# Patient Record
Sex: Male | Born: 1950 | Race: White | Hispanic: No | Marital: Married | Smoking: Current every day smoker
Health system: Southern US, Community
[De-identification: ages and names within clinical notes are randomized; demographics above are authoritative.]

## PROBLEM LIST (undated history)

## (undated) DIAGNOSIS — G4730 Sleep apnea, unspecified: Secondary | ICD-10-CM

## (undated) DIAGNOSIS — N138 Other obstructive and reflux uropathy: Secondary | ICD-10-CM

## (undated) DIAGNOSIS — I10 Essential (primary) hypertension: Secondary | ICD-10-CM

## (undated) DIAGNOSIS — R972 Elevated prostate specific antigen [PSA]: Secondary | ICD-10-CM

## (undated) DIAGNOSIS — G47419 Narcolepsy without cataplexy: Secondary | ICD-10-CM

## (undated) DIAGNOSIS — A6000 Herpesviral infection of urogenital system, unspecified: Secondary | ICD-10-CM

## (undated) DIAGNOSIS — H6190 Disorder of external ear, unspecified, unspecified ear: Secondary | ICD-10-CM

## (undated) DIAGNOSIS — Z720 Tobacco use: Secondary | ICD-10-CM

## (undated) DIAGNOSIS — N401 Enlarged prostate with lower urinary tract symptoms: Secondary | ICD-10-CM

## (undated) DIAGNOSIS — E785 Hyperlipidemia, unspecified: Secondary | ICD-10-CM

## (undated) DIAGNOSIS — L0390 Cellulitis, unspecified: Secondary | ICD-10-CM

## (undated) DIAGNOSIS — F1010 Alcohol abuse, uncomplicated: Secondary | ICD-10-CM

## (undated) DIAGNOSIS — J449 Chronic obstructive pulmonary disease, unspecified: Secondary | ICD-10-CM

## (undated) DIAGNOSIS — E782 Mixed hyperlipidemia: Secondary | ICD-10-CM

## (undated) DIAGNOSIS — C61 Malignant neoplasm of prostate: Secondary | ICD-10-CM

## (undated) DIAGNOSIS — C801 Malignant (primary) neoplasm, unspecified: Secondary | ICD-10-CM

## (undated) DIAGNOSIS — J310 Chronic rhinitis: Secondary | ICD-10-CM

## (undated) HISTORY — DX: Elevated prostate specific antigen (PSA): R97.20

## (undated) HISTORY — DX: Chronic rhinitis: J31.0

## (undated) HISTORY — DX: Disorder of external ear, unspecified, unspecified ear: H61.90

## (undated) HISTORY — DX: Hyperlipidemia, unspecified: E78.5

## (undated) HISTORY — DX: Sleep apnea, unspecified: G47.30

## (undated) HISTORY — DX: Chronic obstructive pulmonary disease, unspecified: J44.9

## (undated) HISTORY — DX: Other obstructive and reflux uropathy: N13.8

## (undated) HISTORY — DX: Benign prostatic hyperplasia with lower urinary tract symptoms: N40.1

## (undated) HISTORY — DX: Essential (primary) hypertension: I10

## (undated) HISTORY — DX: Alcohol abuse, uncomplicated: F10.10

## (undated) HISTORY — PX: TONSILLECTOMY: SUR1361

## (undated) HISTORY — DX: Narcolepsy without cataplexy: G47.419

## (undated) HISTORY — DX: Tobacco use: Z72.0

## (undated) HISTORY — DX: Mixed hyperlipidemia: E78.2

## (undated) HISTORY — DX: Malignant neoplasm of prostate: C61

## (undated) HISTORY — DX: Herpesviral infection of urogenital system, unspecified: A60.00

## (undated) HISTORY — DX: Cellulitis, unspecified: L03.90

---

## 2004-09-28 ENCOUNTER — Ambulatory Visit: Payer: Self-pay | Admitting: Gastroenterology

## 2014-02-28 DIAGNOSIS — G4730 Sleep apnea, unspecified: Secondary | ICD-10-CM | POA: Insufficient documentation

## 2014-02-28 DIAGNOSIS — E782 Mixed hyperlipidemia: Secondary | ICD-10-CM

## 2014-02-28 DIAGNOSIS — F1010 Alcohol abuse, uncomplicated: Secondary | ICD-10-CM | POA: Insufficient documentation

## 2014-02-28 DIAGNOSIS — G4733 Obstructive sleep apnea (adult) (pediatric): Secondary | ICD-10-CM | POA: Insufficient documentation

## 2014-02-28 DIAGNOSIS — Z720 Tobacco use: Secondary | ICD-10-CM | POA: Insufficient documentation

## 2014-02-28 HISTORY — DX: Mixed hyperlipidemia: E78.2

## 2014-02-28 HISTORY — DX: Alcohol abuse, uncomplicated: F10.10

## 2014-02-28 HISTORY — DX: Sleep apnea, unspecified: G47.30

## 2014-04-28 DIAGNOSIS — J449 Chronic obstructive pulmonary disease, unspecified: Secondary | ICD-10-CM

## 2014-04-28 DIAGNOSIS — J441 Chronic obstructive pulmonary disease with (acute) exacerbation: Secondary | ICD-10-CM | POA: Insufficient documentation

## 2014-04-28 DIAGNOSIS — H6190 Disorder of external ear, unspecified, unspecified ear: Secondary | ICD-10-CM

## 2014-04-28 HISTORY — DX: Chronic obstructive pulmonary disease, unspecified: J44.9

## 2014-04-28 HISTORY — DX: Disorder of external ear, unspecified, unspecified ear: H61.90

## 2014-08-29 DIAGNOSIS — R0681 Apnea, not elsewhere classified: Secondary | ICD-10-CM | POA: Insufficient documentation

## 2014-09-21 DIAGNOSIS — L0291 Cutaneous abscess, unspecified: Secondary | ICD-10-CM | POA: Diagnosis not present

## 2014-09-21 DIAGNOSIS — L0390 Cellulitis, unspecified: Secondary | ICD-10-CM | POA: Diagnosis not present

## 2014-09-21 DIAGNOSIS — L02232 Carbuncle of back [any part, except buttock]: Secondary | ICD-10-CM | POA: Diagnosis not present

## 2014-09-21 HISTORY — DX: Cellulitis, unspecified: L03.90

## 2014-09-26 DIAGNOSIS — J449 Chronic obstructive pulmonary disease, unspecified: Secondary | ICD-10-CM | POA: Diagnosis not present

## 2014-10-05 DIAGNOSIS — J449 Chronic obstructive pulmonary disease, unspecified: Secondary | ICD-10-CM | POA: Diagnosis not present

## 2014-10-05 DIAGNOSIS — L0291 Cutaneous abscess, unspecified: Secondary | ICD-10-CM | POA: Diagnosis not present

## 2014-10-05 DIAGNOSIS — L0390 Cellulitis, unspecified: Secondary | ICD-10-CM | POA: Diagnosis not present

## 2014-10-05 DIAGNOSIS — I10 Essential (primary) hypertension: Secondary | ICD-10-CM | POA: Diagnosis not present

## 2014-10-19 DIAGNOSIS — I10 Essential (primary) hypertension: Secondary | ICD-10-CM | POA: Diagnosis not present

## 2014-10-19 DIAGNOSIS — J410 Simple chronic bronchitis: Secondary | ICD-10-CM | POA: Diagnosis not present

## 2014-10-27 DIAGNOSIS — J449 Chronic obstructive pulmonary disease, unspecified: Secondary | ICD-10-CM | POA: Diagnosis not present

## 2014-11-16 DIAGNOSIS — I10 Essential (primary) hypertension: Secondary | ICD-10-CM | POA: Diagnosis not present

## 2014-11-16 DIAGNOSIS — J410 Simple chronic bronchitis: Secondary | ICD-10-CM | POA: Diagnosis not present

## 2014-11-25 DIAGNOSIS — J449 Chronic obstructive pulmonary disease, unspecified: Secondary | ICD-10-CM | POA: Diagnosis not present

## 2014-12-26 DIAGNOSIS — J449 Chronic obstructive pulmonary disease, unspecified: Secondary | ICD-10-CM | POA: Diagnosis not present

## 2015-02-16 DIAGNOSIS — R972 Elevated prostate specific antigen [PSA]: Secondary | ICD-10-CM

## 2015-02-16 DIAGNOSIS — I10 Essential (primary) hypertension: Secondary | ICD-10-CM | POA: Diagnosis not present

## 2015-02-16 DIAGNOSIS — J449 Chronic obstructive pulmonary disease, unspecified: Secondary | ICD-10-CM | POA: Diagnosis not present

## 2015-02-16 HISTORY — DX: Elevated prostate specific antigen (PSA): R97.20

## 2015-02-17 DIAGNOSIS — I10 Essential (primary) hypertension: Secondary | ICD-10-CM

## 2015-02-17 HISTORY — DX: Essential (primary) hypertension: I10

## 2015-02-18 DIAGNOSIS — I10 Essential (primary) hypertension: Secondary | ICD-10-CM

## 2015-02-18 HISTORY — DX: Essential (primary) hypertension: I10

## 2015-02-22 ENCOUNTER — Encounter: Payer: Self-pay | Admitting: *Deleted

## 2015-02-23 ENCOUNTER — Ambulatory Visit: Payer: Self-pay | Admitting: Urology

## 2015-02-27 DIAGNOSIS — I10 Essential (primary) hypertension: Secondary | ICD-10-CM | POA: Diagnosis not present

## 2015-02-27 DIAGNOSIS — Z720 Tobacco use: Secondary | ICD-10-CM | POA: Diagnosis not present

## 2015-02-27 DIAGNOSIS — E782 Mixed hyperlipidemia: Secondary | ICD-10-CM | POA: Diagnosis not present

## 2015-03-14 ENCOUNTER — Encounter: Payer: Self-pay | Admitting: Urology

## 2015-03-14 ENCOUNTER — Ambulatory Visit (INDEPENDENT_AMBULATORY_CARE_PROVIDER_SITE_OTHER): Payer: Commercial Managed Care - HMO | Admitting: Urology

## 2015-03-14 VITALS — BP 106/69 | HR 85 | Ht 72.0 in | Wt 234.9 lb

## 2015-03-14 DIAGNOSIS — N400 Enlarged prostate without lower urinary tract symptoms: Secondary | ICD-10-CM | POA: Diagnosis not present

## 2015-03-14 DIAGNOSIS — R972 Elevated prostate specific antigen [PSA]: Secondary | ICD-10-CM | POA: Diagnosis not present

## 2015-03-14 LAB — BLADDER SCAN AMB NON-IMAGING: Scan Result: 47

## 2015-03-14 NOTE — Progress Notes (Signed)
03/14/2015 3:38 PM   Esperanza Heir 06/01/1951 [id number]  Referring provider: No referring provider defined for this encounter.  Chief Complaint  Patient presents with  . Elevated PSA    referral [facility] from [facility]. [facility] PSA 6.72 in June    HPI: Mr. Robert Powell is a 64 year old white male who was referred to [place] by Dr. Merita Norton for an elevated PSA. His PSA was 6.72 ng/mL on 02/15/2005 a previous PSA of 6.69 was also noted on 01/18/2014. He has no family history of prostate cancer. His current IPSS score is 0/0.  His PVR on today's visit is 47 mL.  He denies any dysuria, suprapubic pain or gross hematuria. He also denies any recent fevers, chills, nausea or vomiting.      IPSS      03/14/15 1500       International Prostate Symptom Score   How often have you had the sensation of not emptying your bladder? Not at All     How often have you had to urinate less than every two hours? Not at All     How often have you found you stopped and started again several times when you urinated? Not at All     How often have you found it difficult to postpone urination? Not at All     How often have you had a weak urinary stream? Not at All     How often have you had to strain to start urination? Not at All     How many times did you typically get up at night to urinate? None     Total IPSS Score 0     Quality of Life due to urinary symptoms   If you were to spend the rest of your life with your urinary condition just the way it is now how would you feel about that? Delighted        Score:  1-7 Mild 8-19 Moderate 20-35 Severe   PMH: Past Medical History  Diagnosis Date  . Elevated PSA     6.7 on 02/16/2015  . Hypertension   . COPD (chronic obstructive pulmonary disease)   . Hyperlipidemia   . Sleep apnea   . Narcolepsy   . Alcohol abuse   . Genital herpes   . Tobacco abuse   . Rhinitis     Surgical History: Past Surgical History  Procedure Laterality Date    . Tonsillectomy      Home Medications:    Medication List       This list is accurate as of: 03/14/15  3:38 PM.  Always use your most recent med list.               amLODipine 5 MG tablet  Commonly known as:  NORVASC  Take by mouth.     doxazosin 2 MG tablet  Commonly known as:  CARDURA  Take by mouth.     ipratropium-albuterol 0.5-2.5 (3) MG/3ML Soln  Commonly known as:  DUONEB  Inhale into the lungs.     lisinopril-hydrochlorothiazide 20-12.5 MG per tablet  Commonly known as:  PRINZIDE,ZESTORETIC  Take by mouth.     tiotropium 18 MCG inhalation capsule  Commonly known as:  [place] into inhaler and inhale.     VENTOLIN HFA 108 (90 BASE) MCG/ACT inhaler  Generic drug:  albuterol  Inhale into the lungs.        Allergies: Not on File  Family History: Family  History  Problem Relation Age of Onset  . Heart attack Mother   . Heart disease Mother   . Heart attack Father   . Kidney disease Neg Hx   . Prostate cancer Neg Hx     Social History:  reports that he has been smoking.  He does not have any smokeless tobacco history on file. He reports that he drinks alcohol. He reports that he does not use illicit drugs.  ROS: Urological Symptom Review  Patient is experiencing the following symptoms: Elevated PSA   Review of Systems  Gastrointestinal (upper)  : Negative for upper GI symptoms  Gastrointestinal (lower) : Negative for lower GI symptoms  Constitutional : Negative for symptoms  Skin: Negative for skin symptoms  Eyes: Negative for eye symptoms  Ear/Nose/Throat : Negative for Ear/Nose/Throat symptoms  Hematologic/Lymphatic: Negative for Hematologic/Lymphatic symptoms  Cardiovascular : Negative for cardiovascular symptoms  Respiratory : Negative for respiratory symptoms  Endocrine: Negative for endocrine symptoms  Musculoskeletal: Negative for musculoskeletal symptoms  Neurological: Negative for neurological  symptoms  Psychologic: Negative for psychiatric symptoms   Physical Exam: BP 106/69 mmHg  Pulse 85  Ht 6' (1.829 m)  Wt 234 lb 14.4 oz (106.55 kg)  BMI 31.85 kg/m2  Constitutional:  Alert and oriented, No acute distress. HEENT: [place] AT, moist mucus membranes.  Trachea midline, no masses. Cardiovascular: No clubbing, cyanosis, or edema. Respiratory: Normal respiratory effort, no increased work of breathing. GI: Abdomen is soft, nontender, nondistended, no abdominal masses GU: No CVA tenderness. GU: Patient with uncircumcised phallus. Foreskin easily retracted  Urethral meatus is patent.  No penile discharge. No penile lesions or rashes. Scrotum without lesions, cysts, rashes and/or edema.  Testicles are located scrotally bilaterally. No masses are appreciated in the testicles. Left and right epididymis are normal. Rectal: Patient with  normal sphincter tone. Perineum without scarring or rashes. No rectal masses are appreciated. Prostate is approximately 70 grams, no nodules are appreciated. Seminal vesicles are normal. Skin: No rashes, bruises or suspicious lesions. Lymph: No cervical or inguinal adenopathy. Neurologic: Grossly intact, no focal deficits, moving all 4 extremities. Psychiatric: Normal mood and affect.  Laboratory Data: Results for orders placed or performed in visit on 03/14/15  BLADDER SCAN AMB NON-IMAGING  Result Value Ref Range   Scan Result 47    No results found for: WBC, HGB, HCT, MCV, PLT  No results found for: CREATININE  No results found for: PSA  No results found for: TESTOSTERONE  No results found for: HGBA1C  Urinalysis No results found for: COLORURINE, APPEARANCEUR, LABSPEC, PHURINE, GLUCOSEU, HGBUR, BILIRUBINUR, KETONESUR, PROTEINUR, UROBILINOGEN, NITRITE, LEUKOCYTESUR  Pertinent Imaging:   Assessment & Plan:    1. Elevated PSA:  Patient's PSA remained relatively unchanged over the last year even though it is elevated. He does not have a  family history of prostate cancer. We will draw another PSA today. If it returns elevated again, we will add a free and total PSA.   We will monitor closely in the future to establish trend. He will follow-up in 6 months time for IPS S score, DRE, PVR and PSA.  - PSA - BLADDER SCAN AMB NON-IMAGING  2. BPH:   Patient's IPSS score is 0/0. His PVR is 47 mL. Continue to monitor. He will return in 6 months time for IPS S, DRE, PVR and PSA.   No Follow-up on file.  Zara Council, [facility] [address] [place], [place] [address] [phone]

## 2015-03-15 ENCOUNTER — Telehealth: Payer: Self-pay

## 2015-03-15 LAB — PSA: PROSTATE SPECIFIC AG, SERUM: 6.1 ng/mL — AB (ref 0.0–4.0)

## 2015-03-15 NOTE — Telephone Encounter (Signed)
-----   Message from Nori Riis, PA-C sent at 03/15/2015  8:03 AM EDT ----- Please add a free and total PSA to his blood work.

## 2015-03-15 NOTE — Telephone Encounter (Signed)
Spoke with Danae Chen at [facility]. Added test. Forms will be sent over for [facility] to sign. Cw,lpn

## 2015-03-16 ENCOUNTER — Telehealth: Payer: Self-pay

## 2015-03-16 LAB — PSA, TOTAL AND FREE
PSA, Free Pct: 8.7 %
PSA, Free: 0.54 ng/mL
Prostate Specific Ag, Serum: 6.2 ng/mL — ABNORMAL HIGH (ref 0.0–4.0)

## 2015-03-16 LAB — SPECIMEN STATUS REPORT

## 2015-03-16 NOTE — Telephone Encounter (Signed)
Robert Powell returned a miss call. Nurses were busy with patients at the time so I told him that you would call him back.

## 2015-03-16 NOTE — Telephone Encounter (Signed)
Spoke with pt in reference to prostate bx. Pt was concerned but willing to have procedure. Pt was transferred to the front to make bx appt. Nurse offered pt to come in to speak with [facility]. Pt stated at this time he does not want to but if he changes his mind he will call back. Cw,lpn

## 2015-03-16 NOTE — Telephone Encounter (Signed)
LMOM

## 2015-03-16 NOTE — Telephone Encounter (Signed)
-----   Message from Nori Riis, PA-C sent at 03/16/2015  8:14 AM EDT ----- The free and total PSA results indicate a 55% probability of prostate cancer.  I suggest he undergo a biopsy.  Please explain the procedure and the risks involved, such as blood in urine, blood in stool, blood in semen, infection, urinary retention, and on rare occasions sepsis and death.  If patient is on ASA, advise him to discontinue the medication ten days prior to the biopsy.  Other anticoagulants will need cardiac clearance.

## 2015-04-05 ENCOUNTER — Ambulatory Visit: Payer: Self-pay | Admitting: Urology

## 2015-04-07 ENCOUNTER — Ambulatory Visit (INDEPENDENT_AMBULATORY_CARE_PROVIDER_SITE_OTHER): Payer: Commercial Managed Care - HMO | Admitting: Urology

## 2015-04-07 DIAGNOSIS — R972 Elevated prostate specific antigen [PSA]: Secondary | ICD-10-CM

## 2015-04-07 DIAGNOSIS — N138 Other obstructive and reflux uropathy: Secondary | ICD-10-CM

## 2015-04-07 DIAGNOSIS — N401 Enlarged prostate with lower urinary tract symptoms: Secondary | ICD-10-CM | POA: Diagnosis not present

## 2015-04-07 DIAGNOSIS — N402 Nodular prostate without lower urinary tract symptoms: Secondary | ICD-10-CM | POA: Diagnosis not present

## 2015-04-12 ENCOUNTER — Telehealth: Payer: Self-pay | Admitting: Urology

## 2015-04-12 ENCOUNTER — Encounter: Payer: Self-pay | Admitting: Urology

## 2015-04-12 DIAGNOSIS — N138 Other obstructive and reflux uropathy: Secondary | ICD-10-CM | POA: Insufficient documentation

## 2015-04-12 DIAGNOSIS — N401 Enlarged prostate with lower urinary tract symptoms: Secondary | ICD-10-CM

## 2015-04-12 HISTORY — DX: Other obstructive and reflux uropathy: N13.8

## 2015-04-12 HISTORY — DX: Other obstructive and reflux uropathy: N40.1

## 2015-04-12 NOTE — Progress Notes (Signed)
9:54 PM   Robert Powell 21-Jan-1951 [id number]  Referring provider: Glendon Axe, MD [place], [place] [address]  No chief complaint on file.   HPI: Robert Powell is a 64 year old white male with an elevated PSA who presents today to discuss a possible prostate biopsy.  His recent IPSS score  is 0, which is mild lower urinary tract symptomatology. He is delighted with his quality life due to his urinary symptoms. His recent PVR is 47 mL.    He was seen by his PCP for his elevated PSA and was started on Cardura 2 mg daily and referred to [place].   He denies any dysuria, hematuria or suprapubic pain.   Previous PSA's:      6.69 ng/mL on 01/18/2014      6.72 ng/mL on 02/16/2015      6.20 ng/mL on 03/16/2015      PSA, free % 8.7 on 03/16/2015  He also denies any recent fevers, chills, nausea or vomiting.  He does not have a family history of PCa.      IPSS      03/14/15 1500       International Prostate Symptom Score   How often have you had the sensation of not emptying your bladder? Not at All     How often have you had to urinate less than every two hours? Not at All     How often have you found you stopped and started again several times when you urinated? Not at All     How often have you found it difficult to postpone urination? Not at All     How often have you had a weak urinary stream? Not at All     How often have you had to strain to start urination? Not at All     How many times did you typically get up at night to urinate? None     Total IPSS Score 0     Quality of Life due to urinary symptoms   If you were to spend the rest of your life with your urinary condition just the way it is now how would you feel about that? Delighted        Score:  1-7 Mild 8-19 Moderate 20-35 Severe  PMH: Past Medical History  Diagnosis Date  . Elevated PSA     6.7 on 02/16/2015  . Hypertension   . COPD (chronic obstructive pulmonary disease)   .  Hyperlipidemia   . Sleep apnea   . Narcolepsy   . Alcohol abuse   . Genital herpes   . Tobacco abuse   . Rhinitis     Surgical History: Past Surgical History  Procedure Laterality Date  . Tonsillectomy      Home Medications:    Medication List       This list is accurate as of: 04/07/15 11:59 PM.  Always use your most recent med list.               amLODipine 5 MG tablet  Commonly known as:  NORVASC  Take by mouth.     doxazosin 2 MG tablet  Commonly known as:  CARDURA  Take by mouth.     ipratropium-albuterol 0.5-2.5 (3) MG/3ML Soln  Commonly known as:  DUONEB  Inhale into the lungs.     lisinopril-hydrochlorothiazide 20-12.5 MG per tablet  Commonly known as:  PRINZIDE,ZESTORETIC  Take by mouth.  tiotropium 18 MCG inhalation capsule  Commonly known as:  [place] into inhaler and inhale.     VENTOLIN HFA 108 (90 BASE) MCG/ACT inhaler  Generic drug:  albuterol  Inhale into the lungs.        Allergies: Not on File  Family History: Family History  Problem Relation Age of Onset  . Heart attack Mother   . Heart disease Mother   . Heart attack Father   . Kidney disease Neg Hx   . Prostate cancer Neg Hx     Social History:  reports that he has been smoking.  He does not have any smokeless tobacco history on file. He reports that he drinks alcohol. He reports that he does not use illicit drugs.  ROS: Urological Symptom Review  Patient is experiencing the following symptoms: Elevated PSA   Review of Systems  Gastrointestinal (upper)  : Negative for upper GI symptoms  Gastrointestinal (lower) : Negative for lower GI symptoms  Constitutional : Negative for symptoms  Skin: Negative for skin symptoms  Eyes: Negative for eye symptoms  Ear/Nose/Throat : Negative for Ear/Nose/Throat symptoms  Hematologic/Lymphatic: Negative for Hematologic/Lymphatic symptoms  Cardiovascular : Negative for cardiovascular symptoms  Respiratory  : Negative for respiratory symptoms  Endocrine: Negative for endocrine symptoms  Musculoskeletal: Negative for musculoskeletal symptoms  Neurological: Negative for neurological symptoms  Psychologic: Negative for psychiatric symptoms   Physical Exam: There were no vitals taken for this visit.   Laboratory Data: Results for orders placed or performed in visit on 03/14/15  PSA  Result Value Ref Range   Prostate Specific Ag, Serum 6.1 (H) 0.0 - 4.0 ng/mL  PSA, total and free  Result Value Ref Range   Prostate Specific Ag, Serum 6.2 (H) 0.0 - 4.0 ng/mL   PSA, Free 0.54 N/A ng/mL   PSA, Free Pct 8.7 %  Specimen status report  Result Value Ref Range   specimen status report Comment   BLADDER SCAN AMB NON-IMAGING  Result Value Ref Range   Scan Result 47    No results found for: WBC, HGB, HCT, MCV, PLT  No results found for: CREATININE  Lab Results  Component Value Date   PSA 6.1* 03/14/2015   PSA 6.2* 03/14/2015    No results found for: TESTOSTERONE  No results found for: HGBA1C  Urinalysis No results found for: COLORURINE, APPEARANCEUR, LABSPEC, PHURINE, GLUCOSEU, HGBUR, BILIRUBINUR, KETONESUR, PROTEINUR, UROBILINOGEN, NITRITE, LEUKOCYTESUR  Pertinent Imaging:   Assessment & Plan:    1. Elevated PSA:  Patient's PSA remained relatively unchanged over the last year even though it is elevated. He does not have a family history of prostate cancer.  His free PSA was 0.54 ng/mL.  He is hesitant about pursuing a biopsy at this time.  His wife is wanting him to undergo a biopsy.  I have suggested a 4K score for another investigative tool before making the decision to undergo a biopsy.    2. BPH with LUTS:   Patient's IPSS score is 0/0. His PVR is 47 mL. His DRE demonstrates an enlarged prostate.  Patient will have a 4K score today.  If he does not undergo biopsy, we will monitor closely.  He will return in 6 months time for IPSS, DRE, PVR and PSA.   No Follow-up on  file.  Robert Powell, [facility] [address] [place], [place] [address] [phone]

## 2015-04-12 NOTE — Telephone Encounter (Signed)
Would you check to see if the patient's 4K score is available?

## 2015-04-13 NOTE — Telephone Encounter (Signed)
Patient's 4K score is 59%.  He is at high risk for PCa.  I recommend proceeding with the biopsy.

## 2015-04-13 NOTE — Telephone Encounter (Signed)
LMOM

## 2015-04-13 NOTE — Telephone Encounter (Signed)
It takes about a month for the results to come back. 4K score will mail/fax results to [facility].

## 2015-04-14 ENCOUNTER — Ambulatory Visit (INDEPENDENT_AMBULATORY_CARE_PROVIDER_SITE_OTHER): Payer: Commercial Managed Care - HMO | Admitting: Urology

## 2015-04-14 ENCOUNTER — Other Ambulatory Visit: Payer: Self-pay | Admitting: Urology

## 2015-04-14 VITALS — BP 146/84 | HR 88 | Ht 72.0 in | Wt 231.1 lb

## 2015-04-14 DIAGNOSIS — N402 Nodular prostate without lower urinary tract symptoms: Secondary | ICD-10-CM

## 2015-04-14 DIAGNOSIS — R972 Elevated prostate specific antigen [PSA]: Secondary | ICD-10-CM | POA: Diagnosis not present

## 2015-04-14 DIAGNOSIS — C61 Malignant neoplasm of prostate: Secondary | ICD-10-CM | POA: Diagnosis not present

## 2015-04-14 MED ORDER — LEVOFLOXACIN 500 MG PO TABS
500.0000 mg | ORAL_TABLET | Freq: Once | ORAL | Status: AC
  Administered 2015-04-14: 500 mg via ORAL

## 2015-04-14 MED ORDER — GENTAMICIN SULFATE 40 MG/ML IJ SOLN
80.0000 mg | Freq: Once | INTRAMUSCULAR | Status: AC
  Administered 2015-04-14: 80 mg via INTRAMUSCULAR

## 2015-04-14 NOTE — Telephone Encounter (Signed)
Pt returned [facility] call. I notified him of his 4K score results and the need to proceed w/ the biopsy that's scheduled for this afternoon. The pt verbally stated understanding.

## 2015-04-14 NOTE — Progress Notes (Signed)
IM Injection  Patient is present today for an IM Injection prior to prostate biopsy Drug: Gentamicin Dose:80mg  Location:Left upper outer buttock Lot: [id number] Exp:4/17 Patient tolerated well, no complications were noted  Preformed by: Fonnie Jarvis, CMA

## 2015-04-14 NOTE — Progress Notes (Signed)
04/14/15  Robert Powell 1951-06-10 [id number]  Referring provider: Glendon Axe, MD [place],  [address]  Chief Complaint  Patient presents with  . Prostate Biopsy    HPI: 64 year old white male with an elevated PSA who presents today for prostate biopsy.  He was seen by his PCP for his elevated PSA and was started on Cardura 2 mg daily and referred to [place].   He denies any dysuria, hematuria or suprapubic pain.   Previous PSA's:      6.69 ng/mL on 01/18/2014      6.72 ng/mL on 02/16/2015      6.20 ng/mL on 03/16/2015      PSA, free % 8.7 on 03/16/2015  4K score 59%, high risk for prostate cancer.   His recent IPSS score  is 0, which is mild lower urinary tract symptomatology. He is delighted with his quality life due to his urinary symptoms. His recent PVR is 47 mL.    He also denies any recent fevers, chills, nausea or vomiting.  He does not have a family history of PCa.      IPSS      03/14/15 1500       International Prostate Symptom Score   How often have you had the sensation of not emptying your bladder? Not at All     How often have you had to urinate less than every two hours? Not at All     How often have you found you stopped and started again several times when you urinated? Not at All     How often have you found it difficult to postpone urination? Not at All     How often have you had a weak urinary stream? Not at All     How often have you had to strain to start urination? Not at All     How many times did you typically get up at night to urinate? None     Total IPSS Score 0     Quality of Life due to urinary symptoms   If you were to spend the rest of your life with your urinary condition just the way it is now how would you feel about that? Delighted        Score:  1-7 Mild 8-19 Moderate 20-35 Severe  PMH: Past Medical History  Diagnosis Date  . Elevated PSA     6.7 on 02/16/2015  . Hypertension   . COPD (chronic  obstructive pulmonary disease)   . Hyperlipidemia   . Sleep apnea   . Narcolepsy   . Alcohol abuse   . Genital herpes   . Tobacco abuse   . Rhinitis     Surgical History: Past Surgical History  Procedure Laterality Date  . Tonsillectomy      Home Medications:    Medication List       This list is accurate as of: 04/14/15 11:59 PM.  Always use your most recent med list.               amLODipine 5 MG tablet  Commonly known as:  NORVASC  Take by mouth.     doxazosin 2 MG tablet  Commonly known as:  CARDURA  Take by mouth.     ipratropium-albuterol 0.5-2.5 (3) MG/3ML Soln  Commonly known as:  DUONEB  Inhale into the lungs.     lisinopril-hydrochlorothiazide 20-12.5 MG per tablet  Commonly known as:  PRINZIDE,ZESTORETIC  Take by mouth.     tiotropium 18 MCG inhalation capsule  Commonly known as:  [place] into inhaler and inhale.     VENTOLIN HFA 108 (90 BASE) MCG/ACT inhaler  Generic drug:  albuterol  Inhale into the lungs.        Allergies: No Known Allergies  Family History: Family History  Problem Relation Age of Onset  . Heart attack Mother   . Heart disease Mother   . Heart attack Father   . Kidney disease Neg Hx   . Prostate cancer Neg Hx     Social History:  reports that he has been smoking.  He does not have any smokeless tobacco history on file. He reports that he drinks alcohol. He reports that he does not use illicit drugs.   Physical Exam: BP 146/84 mmHg  Pulse 88  Ht 6' (1.829 m)  Wt 231 lb 1.6 oz (104.826 kg)  BMI 31.34 kg/m2  Rectal exam reveals normal sphincter tone. Slightly enlarged prostate with firm nodule of left mid gland. Nontender.  Laboratory Data: Results for orders placed or performed in visit on 03/14/15  PSA  Result Value Ref Range   Prostate Specific Ag, Serum 6.1 (H) 0.0 - 4.0 ng/mL  PSA, total and free  Result Value Ref Range   Prostate Specific Ag, Serum 6.2 (H) 0.0 - 4.0 ng/mL   PSA, Free 0.54 N/A  ng/mL   PSA, Free Pct 8.7 %  Specimen status report  Result Value Ref Range   specimen status report Comment   BLADDER SCAN AMB NON-IMAGING  Result Value Ref Range   Scan Result 47     Prostate Biopsy Procedure   Informed consent was obtained after discussing risks/benefits of the procedure.  A time out was performed to ensure correct patient identity.  Pre-Procedure: - Last PSA Level:  Lab Results  Component Value Date   PSA 6.1* 03/14/2015   PSA 6.2* 03/14/2015   - Gentamicin given prophylactically - Levaquin 500 mg administered PO -Transrectal Ultrasound performed revealing a 35 gm prostate -Slight intravesical protrusion of the prostate noted -Multiple diffuse calcifications including at the apex and right periphery -Hypoechoic lesion on the left lateral border of the prostate at site of nodule.  Procedure: - Prostate block performed using 10 cc 1% lidocaine and biopsies taken from sextant areas, a total of 12 under ultrasound guidance.  Post-Procedure: - Patient tolerated the procedure well - He was counseled to seek immediate medical attention if experiences any severe pain, significant bleeding, or fevers - Return in one week to discuss biopsy results  Assessment & Plan:  64 year old male with elevated PSA to 6.2, elevated for K score, and abnormal rectal exam status post uncomplicated biopsy today. He will return in 1 week to discuss results.  1. Elevated PSA:  S/p uncomplicated biopsy  2. Prostate nodule   Return in about 1 week (around 04/21/2015) for prostate biopsy results.  Hollice Espy, MD  [facility] [address] [place], [place] [address] [phone]

## 2015-04-19 LAB — PATHOLOGY REPORT

## 2015-04-21 ENCOUNTER — Ambulatory Visit (INDEPENDENT_AMBULATORY_CARE_PROVIDER_SITE_OTHER): Payer: Commercial Managed Care - HMO | Admitting: Urology

## 2015-04-21 ENCOUNTER — Other Ambulatory Visit: Payer: Self-pay | Admitting: Urology

## 2015-04-21 ENCOUNTER — Telehealth: Payer: Self-pay | Admitting: Urology

## 2015-04-21 ENCOUNTER — Encounter: Payer: Self-pay | Admitting: Urology

## 2015-04-21 VITALS — BP 121/77 | HR 76 | Ht 72.0 in | Wt 228.5 lb

## 2015-04-21 DIAGNOSIS — C61 Malignant neoplasm of prostate: Secondary | ICD-10-CM

## 2015-04-21 NOTE — Progress Notes (Signed)
04/21/2015 1:59 PM   Robert Powell 12/08/1950 [id number]  Referring provider: Glendon Axe, MD [place], [place] [address]  Chief Complaint  Patient presents with  . Follow-up    biopsy results    HPI: Patient presents for consultation regarding his T2c adenocarcinoma prostate. Discussed with him at length. His wife was present. The discussion can be reviewed below. I did give him a book on 100 questions most commonly [place] prostate cancer told him second opinion is certainly a option for them today so desire but if they go to a radiation therapist to get radiation therapy view which will be tendency toward that approach 4 his adenocarcinoma. I feel he needs surgery explained to them the need to do with a large burden of intra-prostatic cancer with an aggressive surgical approach HPI   PMH: Past Medical History  Diagnosis Date  . Elevated PSA     6.7 on 02/16/2015  . Hypertension   . COPD (chronic obstructive pulmonary disease)   . Hyperlipidemia   . Sleep apnea   . Narcolepsy   . Alcohol abuse   . Genital herpes   . Tobacco abuse   . Rhinitis     Surgical History: Past Surgical History  Procedure Laterality Date  . Tonsillectomy      Home Medications:    Medication List       This list is accurate as of: 04/21/15  1:59 PM.  Always use your most recent med list.               amLODipine 5 MG tablet  Commonly known as:  NORVASC  Take by mouth.     doxazosin 2 MG tablet  Commonly known as:  CARDURA  Take by mouth.     ipratropium-albuterol 0.5-2.5 (3) MG/3ML Soln  Commonly known as:  DUONEB  Inhale into the lungs.     lisinopril-hydrochlorothiazide 20-12.5 MG per tablet  Commonly known as:  PRINZIDE,ZESTORETIC  Take by mouth.     tiotropium 18 MCG inhalation capsule  Commonly known as:  [place] into inhaler and inhale.     VENTOLIN HFA 108 (90 BASE) MCG/ACT inhaler  Generic drug:  albuterol  Inhale into the lungs.          Allergies: No Known Allergies  Family History: Family History  Problem Relation Age of Onset  . Heart attack Mother   . Heart disease Mother   . Heart attack Father   . Kidney disease Neg Hx   . Prostate cancer Neg Hx     Social History:  reports that he has been smoking.  He does not have any smokeless tobacco history on file. He reports that he drinks alcohol. He reports that he does not use illicit drugs.  ROS:                                        Physical Exam: BP 121/77 mmHg  Pulse 76  Ht 6' (1.829 m)  Wt 228 lb 8 oz (103.647 kg)  BMI 30.98 kg/m2  Constitutional:  Alert and oriented, No acute distress. HEENT: [place] AT, moist mucus membranes.  Trachea midline, no masses. Cardiovascular: No clubbing, cyanosis, or edema. Respiratory: Normal respiratory effort, no increased work of breathing. GI: Abdomen is soft, nontender, nondistended, no abdominal masses GU: No CVA tenderness.  Skin: No rashes, bruises or suspicious lesions.  Lymph: No cervical or inguinal adenopathy. Neurologic: Grossly intact, no focal deficits, moving all 4 extremities. Psychiatric: Normal mood and affect.  Laboratory Data: No results found for: WBC, HGB, HCT, MCV, PLT  No results found for: CREATININE  Lab Results  Component Value Date   PSA 6.1* 03/14/2015   PSA 6.2* 03/14/2015    No results found for: TESTOSTERONE  No results found for: HGBA1C  Urinalysis No results found for: COLORURINE, APPEARANCEUR, LABSPEC, PHURINE, GLUCOSEU, HGBUR, BILIRUBINUR, KETONESUR, PROTEINUR, UROBILINOGEN, NITRITE, LEUKOCYTESUR  Pertinent Imaging: Pelvic CT ordered  Assessment and Plan: Ade we had discussed the pros and cons of radiation therapy both external beam and interstitial seed implant. He was not desirous of this approach. He wants prostate out. I do not think he is a good candidate for either HIFU or cryotherapy. I'm going to order a CT of the pelvis in the interim  with contrast for both staging and purposes of surgical staging. Patient is staged at T2c nocarcinoma the prostate. 7 of 12 biopsies positive. Bilateral disease. Highest grade 1 biopsy positive for 3+4 for which was 5% of the biopsy. Biopsies were apical base and mid. Patient is otherwise healthy. He has some allergy issues and is on albuterol Norvasc Spiriva and Prinzide. He is active. I felt age 64 with a PSA of 6.2 patient is a good candidate for robotic prostatectomy. Discussed case with Dr. Erlene Quan concurred with felt she would do a nodal dissection on this patient. He is willing to wait 3 months for her to return from maternity leave although I offered him the opportunity to be referred to the clinic and including [place] [facility] or 2. Patient did not want surgery outside of the local area. We discussed complications of the procedure including impotence and incontinence. Patient is staged as T2c      Problem List Items Addressed This Visit    None    Visit Diagnoses    Prostate cancer    -  Primary       No Follow-up on file.  Collier Flowers, [facility] [address], [place], [place] [address] [phone]

## 2015-04-21 NOTE — Telephone Encounter (Signed)
I contacted the pt and notified him that he will be receiving a phone call one day next week concerning his CT appt and also following up on his newly prostate cancer diagnosis.

## 2015-04-27 ENCOUNTER — Ambulatory Visit: Payer: Self-pay | Admitting: Urology

## 2015-04-28 NOTE — Telephone Encounter (Signed)
Robert Powell called the pt and followed up with the pt on Monday, August 15th.

## 2015-05-22 DIAGNOSIS — C61 Malignant neoplasm of prostate: Secondary | ICD-10-CM

## 2015-05-22 DIAGNOSIS — I10 Essential (primary) hypertension: Secondary | ICD-10-CM | POA: Diagnosis not present

## 2015-05-22 DIAGNOSIS — J449 Chronic obstructive pulmonary disease, unspecified: Secondary | ICD-10-CM | POA: Diagnosis not present

## 2015-05-22 HISTORY — DX: Malignant neoplasm of prostate: C61

## 2015-05-29 ENCOUNTER — Other Ambulatory Visit: Payer: Self-pay | Admitting: Urology

## 2015-06-23 ENCOUNTER — Other Ambulatory Visit: Payer: Self-pay

## 2015-08-08 ENCOUNTER — Ambulatory Visit
Admission: RE | Admit: 2015-08-08 | Discharge: 2015-08-08 | Disposition: A | Discharge status: Home / Self Care | Payer: Commercial Managed Care - HMO | Source: Ambulatory Visit | Attending: Urology | Admitting: Urology

## 2015-08-08 DIAGNOSIS — C61 Malignant neoplasm of prostate: Secondary | ICD-10-CM

## 2015-08-08 DIAGNOSIS — K7689 Other specified diseases of liver: Secondary | ICD-10-CM | POA: Diagnosis not present

## 2015-08-08 HISTORY — DX: Malignant (primary) neoplasm, unspecified: C80.1

## 2015-08-08 MED ORDER — IOHEXOL 300 MG/ML  SOLN
100.0000 mL | Freq: Once | INTRAMUSCULAR | Status: AC | PRN
  Administered 2015-08-08: 100 mL via INTRAVENOUS

## 2015-08-11 ENCOUNTER — Encounter: Payer: Self-pay | Admitting: Urology

## 2015-08-11 ENCOUNTER — Ambulatory Visit (INDEPENDENT_AMBULATORY_CARE_PROVIDER_SITE_OTHER): Payer: Commercial Managed Care - HMO | Admitting: Urology

## 2015-08-11 VITALS — BP 124/75 | HR 91 | Ht 72.0 in | Wt 236.9 lb

## 2015-08-11 DIAGNOSIS — Z720 Tobacco use: Secondary | ICD-10-CM

## 2015-08-11 DIAGNOSIS — C7951 Secondary malignant neoplasm of bone: Secondary | ICD-10-CM

## 2015-08-11 DIAGNOSIS — C61 Malignant neoplasm of prostate: Secondary | ICD-10-CM | POA: Diagnosis not present

## 2015-08-11 DIAGNOSIS — F17200 Nicotine dependence, unspecified, uncomplicated: Secondary | ICD-10-CM

## 2015-08-11 NOTE — Progress Notes (Signed)
08/11/2015 3:00 PM   Robert Powell [birth year] [id number]  Referring provider: Glendon Axe, MD [place] [facility] [place], [place] [address]  Chief Complaint  Patient presents with  . Follow-up    discuss CT results     HPI: 64 year old male diagnosed with primarily Gleason 3+3 and single core of 3+4 prostate cancer in 04/2015 involving 7/12 cores bilaterally with largest burden involving 100% of the left lateral core with Gleason 3+4 (only 5% or less of 4 component).  PSA 6.2 at the time of dx. 35 cc prostate with firm nodule of the left mid gland.  TRUS ultrasound with diffuse prostatic calcifications and hypoechoic area at site of nodule.     He was offered radiation oncology referral as well as referral for second opinion/ earlier surgical date but refused.  He returns today 3 moths later to discuss possible surgery.    CT scan ordered to be performed prior to visit today given concern for relatively large volume disease and grossly palpable.  CT scan shows multiple lytic lesions in L2 L3 L4 vertebral bodies.  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 There is no obvious pelvic lymphadenopathy.  He denies weight loss, bone pain, night sweats, normal appetitie.  No suspicious skin lesions.  No voiding symptoms.  Never had screening colonoscopy.    He does drink substaintial EtOH (blended whisky, approximately 1 gallon per week).  He also smokes 1 ppd x 50 years.    Brother died of lung cancer.  He does have chronic productive cough.   No other recent chest imaging.  CT PMH: Past Medical History  Diagnosis Date  . Elevated PSA     6.7 on 02/16/2015  . Hypertension   . COPD (chronic obstructive pulmonary disease) ([place])   . Hyperlipidemia   . Sleep apnea   . Narcolepsy   . Alcohol abuse   . Genital herpes   . Tobacco abuse   . Rhinitis   . Cancer ([place])   . Benign essential HTN 02/17/2015  . Essential (primary) hypertension  02/18/2015  . Chronic obstructive pulmonary disease ([place]) 04/28/2014  . 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 Adenocarcinoma of prostate ([place]) 05/22/2015  . AA (alcohol abuse) 02/28/2014  . 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 Apnea, sleep 02/28/2014  . Cellulitis 09/21/2014  . Elevated prostate specific antigen (PSA) 02/16/2015  . Lesion of external ear 04/28/2014    Surgical History: Past Surgical History  Procedure Laterality Date  . Tonsillectomy      Home Medications:    Medication List       This list is accurate as of: 08/11/15 11:59 PM.  Always use your most recent med list.               amLODipine 5 MG tablet  Commonly known as:  NORVASC  Take by mouth.     doxazosin 2 MG tablet  Commonly known as:  CARDURA  Take by mouth.     ipratropium-albuterol 0.5-2.5 (3) MG/3ML Soln  Commonly known as:  DUONEB  Inhale into the lungs.     ipratropium-albuterol 0.5-2.5 (3) MG/3ML Soln  Commonly known as:  DUONEB     lisinopril-hydrochlorothiazide 20-12.5 MG tablet  Commonly known as:  PRINZIDE,ZESTORETIC  Take by mouth.     tiotropium 18 MCG inhalation capsule  Commonly known as:  [place] into inhaler and inhale.     VENTOLIN HFA  108 (90 BASE) MCG/ACT inhaler  Generic drug:  albuterol  Inhale into the lungs.        Allergies: No Known Allergies  Family History: Family History  Problem Relation Age of Onset  . Heart attack Mother   . Heart disease Mother   . Heart attack Father   . Kidney disease Neg Hx   . Prostate cancer Neg Hx     Social History:  reports that he has been smoking.  He does not have any smokeless tobacco history on file. He reports that he drinks alcohol. He reports that he does not use illicit drugs.  ROS 12 point review of systems was performed and is otherwise negative.  Physical Exam: BP 124/75 mmHg  Pulse 91  Ht 6' (1.829 m)  Wt 236 lb 14.4 oz (107.457 kg)  BMI 32.12  kg/m2  Constitutional:  Alert and oriented, No acute distress.  Presents today to the office with his wife. HEENT: [place] AT, moist mucus membranes.  Trachea midline, no masses.  Slightly erythematous sclera. Cardiovascular: No clubbing, cyanosis, or edema. Respiratory: Normal respiratory effort, no increased work of breathing. GI: Abdomen is soft, nontender, nondistended, no abdominal masses.  Obese.  GU: No CVA tenderness. Skin: No rashes, bruises or suspicious lesions.  Fascial telangiectasia. Lymph: No cervical or inguinal adenopathy. Neurologic: Grossly intact, no focal deficits, moving all 4 extremities. Psychiatric: Normal mood and affect.  Laboratory Data: Comprehensive Metabolic Panel (CMP) - Final result (02/16/2015 3:59 PM) Comprehensive Metabolic Panel (CMP) - Final result (02/16/2015 3:59 PM)  Component Value Range  Glucose 113 (H) 70-110 mg/dL  Sodium 132 (L) 136-145 mmol/L  Potassium 3.7 3.6-5.1 mmol/L  Chloride 95 (L) 97-109 mmol/L  Carbon Dioxide (CO2) 30.4 22.0-32.0 mmol/L  Urea Nitrogen (BUN) 17 7-25 mg/dL  Creatinine 1.1 0.7-1.3 mg/dL  Glomerular Filtration Rate (eGFR), MDRD Estimate 67 >60 mL/min/1.73sq m  Calcium 9.4 8.7-10.3 mg/dL  AST  20 8-39 U/L  ALT  19 6-57 U/L  Alk Phos (alkaline Phosphatase) 47 34-104 U/L  Albumin 4.3 3.5-4.8 g/dL  Bilirubin, Total 0.4 0.3-1.2 mg/dL  Protein, Total 7.0 6.1-7.9 g/dL  A/G Ratio 1.6     Complete Blood Count (CBC) - Final result (02/16/2015 3:59 PM) Complete Blood Count (CBC) - Final result (02/16/2015 3:59 PM)  Component Value Range  WBC (White Blood Cell Count) 6.7 4.1-10.2 10^3/uL  RBC (Red Blood Cell Count) 4.72 4.69-6.13 10^6/uL  Hemoglobin 15.3 14.1-18.1 gm/dL  Hematocrit 43.7 40.0-52.0 %  MCV (Mean Corpuscular Volume) 92.6 80.0-100.0 fl  MCH (Mean Corpuscular Hemoglobin) 32.4 (H) 27.0-31.2 pg  MCHC (Mean Corpuscular Hemoglobin Concentration) 35.0 32.0-36.0 gm/dL  Platelet Count 187 150-450 10^3/uL  RDW-CV (Red  Cell Distribution Width) 13.0 11.6-14.8 %  MPV (Mean Platelet Volume) 8.9 8.0-10.0 fl     Lab Results  Component Value Date   PSA 6.1* 03/14/2015   PSA 6.2* 03/14/2015    Pertinent Imaging:            Vitals     Height Weight BMI (Calculated)    6' (1.829 m) 236 lb 14.4 oz (107.457 kg) 32.2      Interpretation Summary     CLINICAL DATA: Prostate carcinoma  EXAM: CT ABDOMEN AND PELVIS WITH CONTRAST  TECHNIQUE: Multidetector CT imaging of the abdomen and pelvis was performed using the standard protocol following bolus administration of intravenous contrast.  CONTRAST: 142m OMNIPAQUE IOHEXOL 300 MG/ML SOLN  COMPARISON: None.  FINDINGS: Gallbladder, spleen, pancreas, adrenal glands are within normal limits  12 mm low-density lesion in the caudate lobe of the liver is likely a small cyst.  Bilateral perinephric stranding without hydronephrosis or delayed contrast excretion is nonspecific. It is more prominent about the left kidney. No renal mass.  Duplicated IVC.  Umbilical hernia contains adipose tissue.  Prostate is unremarkable. Mild anterior bladder wall thickening is nonspecific.  Left inguinal hernia contains adipose.  Mild atherosclerotic changes of the aorta.  There are subtle patchy areas in the femoral heads bilaterally. Avascular necrosis is not excluded.  There are multiple lytic lesions in the L2, L3, and L4 vertebral bodies. No vertebral compression deformity.  IMPRESSION: Multiple lumbar lytic bone lesions worrisome for metastatic disease. MRI may be helpful.  Bilateral perinephric stranding left greater than right is nonspecific and may be chronic.  Low-density lesion in the liver is favored to represent a small cyst.  Possible avascular necrosis in the femoral heads bilaterally.   Electronically Signed  By: Marybelle Killings M.D.  On: 08/08/2015 15:46     Assessment & Plan:  64 year old  man with relatively high volume intermediate risk prostate cancer here today to discuss possible prostatectomy.  CT abdomen and pelvis shows unexpected lytic lesions within the lumbar vertebral bodies.  There are no bulky pelvic lymph nodes or any other areas concerning for metastatic prostate cancer.  Given the constellation of these findings with somewhat atypical bone lesions, I do have concerns for possible second primary malignancy.  We discussed today proceeding with further diagnostic imaging as well as referral to medical oncology for further workup. Recommend bone scan as well as CT of the chest given his family history of lung cancer, extensive smoking history, and lytic nature of his bony lesions.  Findings were reviewed today extensively with the patient and his wife. We also discussed plans for further workup. We'll plan to discuss his case at tumor board after imaging is obtained. All of their questions were answered in detail.  1. Prostate cancer [facility]) Defer further discussion of treatment at this time until aforementioned workup complete. We discussed today, if these bone lesions are indeed metastatic prostate cancer, he would no longer be a candidate for radiation or prostatectomy.  2. Bone metastases ([place]) As above. - NM Bone Scan Whole Body - CT Chest Wo Contrast - Ambulatory referral to Oncology  3. Smoker Extensive smoking history, cessation discussed today although patient not interested.   Return in about 2 weeks (around 08/25/2015) for follow up studies.  Hollice Espy, MD  [facility] [address], [place], [place] [address] [phone]  I spent 30 min with this patient of which greater than 50% was spent in counseling and coordination of care with the patient.

## 2015-08-18 ENCOUNTER — Ambulatory Visit
Admission: RE | Admit: 2015-08-18 | Discharge: 2015-08-18 | Disposition: A | Discharge status: Home / Self Care | Payer: Commercial Managed Care - HMO | Source: Ambulatory Visit | Attending: Urology | Admitting: Urology

## 2015-08-18 ENCOUNTER — Encounter
Admission: RE | Admit: 2015-08-18 | Discharge: 2015-08-18 | Disposition: A | Discharge status: Home / Self Care | Payer: Commercial Managed Care - HMO | Source: Ambulatory Visit | Attending: Urology | Admitting: Urology

## 2015-08-18 DIAGNOSIS — C7951 Secondary malignant neoplasm of bone: Secondary | ICD-10-CM | POA: Insufficient documentation

## 2015-08-18 DIAGNOSIS — C61 Malignant neoplasm of prostate: Secondary | ICD-10-CM | POA: Diagnosis not present

## 2015-08-18 DIAGNOSIS — R911 Solitary pulmonary nodule: Secondary | ICD-10-CM | POA: Diagnosis not present

## 2015-08-18 DIAGNOSIS — J432 Centrilobular emphysema: Secondary | ICD-10-CM | POA: Insufficient documentation

## 2015-08-18 MED ORDER — TECHNETIUM TC 99M MEDRONATE IV KIT
25.0000 | PACK | Freq: Once | INTRAVENOUS | Status: AC | PRN
  Administered 2015-08-18: 22.45 via INTRAVENOUS

## 2015-08-22 ENCOUNTER — Encounter: Payer: Self-pay | Admitting: Oncology

## 2015-08-22 ENCOUNTER — Inpatient Hospital Stay: Payer: Commercial Managed Care - HMO

## 2015-08-22 ENCOUNTER — Inpatient Hospital Stay: Payer: Commercial Managed Care - HMO | Attending: Oncology | Admitting: Oncology

## 2015-08-22 VITALS — BP 155/92 | HR 77 | Temp 97.0°F | Resp 18 | Wt 238.5 lb

## 2015-08-22 DIAGNOSIS — J449 Chronic obstructive pulmonary disease, unspecified: Secondary | ICD-10-CM | POA: Insufficient documentation

## 2015-08-22 DIAGNOSIS — I10 Essential (primary) hypertension: Secondary | ICD-10-CM | POA: Insufficient documentation

## 2015-08-22 DIAGNOSIS — F17210 Nicotine dependence, cigarettes, uncomplicated: Secondary | ICD-10-CM | POA: Insufficient documentation

## 2015-08-22 DIAGNOSIS — M5136 Other intervertebral disc degeneration, lumbar region: Secondary | ICD-10-CM | POA: Diagnosis not present

## 2015-08-22 DIAGNOSIS — R911 Solitary pulmonary nodule: Secondary | ICD-10-CM | POA: Insufficient documentation

## 2015-08-22 DIAGNOSIS — Z79899 Other long term (current) drug therapy: Secondary | ICD-10-CM | POA: Insufficient documentation

## 2015-08-22 DIAGNOSIS — M899 Disorder of bone, unspecified: Secondary | ICD-10-CM | POA: Insufficient documentation

## 2015-08-22 DIAGNOSIS — C61 Malignant neoplasm of prostate: Secondary | ICD-10-CM

## 2015-08-22 DIAGNOSIS — E669 Obesity, unspecified: Secondary | ICD-10-CM | POA: Insufficient documentation

## 2015-08-22 LAB — CBC WITH DIFFERENTIAL/PLATELET
BASOS ABS: 0 10*3/uL (ref 0–0.1)
Basophils Relative: 0 %
EOS ABS: 0.2 10*3/uL (ref 0–0.7)
EOS PCT: 3 %
HCT: 45.3 % (ref 40.0–52.0)
HEMOGLOBIN: 15.2 g/dL (ref 13.0–18.0)
LYMPHS PCT: 20 %
Lymphs Abs: 1.3 10*3/uL (ref 1.0–3.6)
MCH: 30.8 pg (ref 26.0–34.0)
MCHC: 33.5 g/dL (ref 32.0–36.0)
MCV: 92.1 fL (ref 80.0–100.0)
Monocytes Absolute: 0.6 10*3/uL (ref 0.2–1.0)
Monocytes Relative: 9 %
NEUTROS PCT: 68 %
Neutro Abs: 4.3 10*3/uL (ref 1.4–6.5)
PLATELETS: 159 10*3/uL (ref 150–440)
RBC: 4.92 MIL/uL (ref 4.40–5.90)
RDW: 13.2 % (ref 11.5–14.5)
WBC: 6.3 10*3/uL (ref 3.8–10.6)

## 2015-08-22 LAB — COMPREHENSIVE METABOLIC PANEL
ALT: 13 U/L — ABNORMAL LOW (ref 17–63)
ANION GAP: 8 (ref 5–15)
AST: 20 U/L (ref 15–41)
Albumin: 4.5 g/dL (ref 3.5–5.0)
Alkaline Phosphatase: 52 U/L (ref 38–126)
BUN: 15 mg/dL (ref 6–20)
CHLORIDE: 97 mmol/L — AB (ref 101–111)
CO2: 30 mmol/L (ref 22–32)
Calcium: 9.3 mg/dL (ref 8.9–10.3)
Creatinine, Ser: 1.01 mg/dL (ref 0.61–1.24)
GFR calc non Af Amer: >60 mL/min (ref >60)
Glucose, Bld: 120 mg/dL — ABNORMAL HIGH (ref 65–99)
POTASSIUM: 3.8 mmol/L (ref 3.5–5.1)
SODIUM: 135 mmol/L (ref 135–145)
Total Bilirubin: 0.5 mg/dL (ref 0.3–1.2)
Total Protein: 7.6 g/dL (ref 6.5–8.1)

## 2015-08-22 NOTE — Progress Notes (Signed)
Patient here today as new evaluation regarding bone mets referred by Dr. Erlene Quan.

## 2015-08-22 NOTE — Progress Notes (Signed)
  Oncology Nurse Navigator Documentation  Referral date to RadOnc/MedOnc: 08/22/15 (08/22/15 1400) Navigator Encounter Type: Initial MedOnc (08/22/15 1400) Patient Visit Type: Medonc (08/22/15 1400) Treatment Phase:  (New diagnosis prostate cancer 04/2015) (08/22/15 1400) Barriers/Navigation Needs: No barriers at this time (08/22/15 1400)                Time Spent with Patient: 15 (08/22/15 1400)   Met with patient and his spouse in MD exam room. Introduced Therapist, nutritional as a resource to make his care less complicated. He states Dr Erlene Quan has given him results of bone scan. Does report fall and left sided injury about 3 years ago. Dr Oliva Bustard will order MRI. Patient is aware he may call at any time with questions or concerns.

## 2015-08-22 NOTE — Progress Notes (Signed)
[place] @ [facility] [phone]  Fax:([phone] [place]: 04/11/1951  MR#: [id number]  POE#:[id number]  Patient Care Team: Glendon Axe, MD as PCP - General (Internal Medicine) Glendon Axe, MD (Internal Medicine)  CHIEF COMPLAINT: /oncology history  Carcinoma of prostate Palpable nodule on physical examination. 64 year old male diagnosed with primarily Gleason 3+3 and single core of 3+4 prostate cancer in 04/2015 involving 7/12 cores bilaterally with largest burden involving 100% of the left lateral core with Gleason 3+4 (only 5% or less of 4 component). PSA 6.2 at the time of dx. 35 cc prostate with firm nodule of the left mid gland. TRUS ultrasound with diffuse prostatic calcifications and hypoechoic area at site of nodule.    VISIT DIAGNOSIS:     ICD-9-CM ICD-10-CM   1. Adenocarcinoma of prostate ([place]) 185 C61 CBC with Differential     Comprehensive metabolic panel      No history exists.    No flowsheet data found.  INTERVAL HISTORY:   64 year old gentleman who had    evaluation in August and biopsy was positive for adenocarcinoma of prostate.  Patient did not follow-up any further appointment with radiation oncologist for another opinion. Patient went back for further evaluation with urologist's.  CT scan of abdomen which was done revealed possibility of lytic area in the lumbar spine.  Bone scan was reported to be negative.Patient has been a chronic smoker and a CT scan of chest was done which revealed multiple rib fracture (patient gives a history of fall) and tiny lung nodule in GENERAL:  Feels good.  Active.  No fevers, sweats or weight loss. PERFORMANCE STATUS (ECOG):  01 HEENT:  No visual changes, runny nose, sore throat, mouth sores or tenderness. Lungs: No shortness of breath or cough.  No hemoptysis. Cardiac:  No chest pain, palpitations, orthopnea, or PND. GI:  No nausea, vomiting, diarrhea, constipation, melena or  hematochezia. GU:  No urgency, frequency, dysuria, or hematuria. Musculoskeletal:  No back pain.  No joint pain.  No muscle tenderness. Extremities:  No pain or swelling. Skin:  No rashes or skin changes. Neuro:  No headache, numbness or weakness, balance or coordination issues. Endocrine:  No diabetes, thyroid issues, hot flashes or night sweats. Psych:  No mood changes, depression or anxiety. Pain:  No focal pain. Review of systems:  All other systems reviewed and found to be negative.the right side which is 4 mm. Patient is now being evaluated here for further workup and rule out bone metastases. Patient denies any back pain.   REVIEW OF SYSTEMS:   GENERAL:  Feels good.  Active.  No fevers, sweats or weight loss. PERFORMANCE STATUS (ECOG):  01 HEENT:  No visual changes, runny nose, sore throat, mouth sores or tenderness. Lungs: No shortness of breath or cough.  No hemoptysis. Cardiac:  No chest pain, palpitations, orthopnea, or PND. GI:  No nausea, vomiting, diarrhea, constipation, melena or hematochezia. GU:  No urgency, frequency, dysuria, or hematuria. Musculoskeletal:  No back pain.  No joint pain.  No muscle tenderness. Extremities:  No pain or swelling. Skin:  No rashes or skin changes. Neuro:  No headache, numbness or weakness, balance or coordination issues. Endocrine:  No diabetes, thyroid issues, hot flashes or night sweats. Psych:  No mood changes, depression or anxiety. Pain:  No focal pain. Review of systems:  All other systems reviewed and found to be negative.  As per HPI. Otherwise, a complete review of systems is negatve.  PAST MEDICAL HISTORY: Past Medical History  Diagnosis Date  . Elevated PSA     6.7 on 02/16/2015  . Hypertension   . COPD (chronic obstructive pulmonary disease) ([place])   . Hyperlipidemia   . Sleep apnea   . Narcolepsy   . Alcohol abuse   . Genital herpes   . Tobacco abuse   . Rhinitis   . Cancer ([place])   . Benign essential HTN  02/17/2015  . Essential (primary) hypertension 02/18/2015  . Chronic obstructive pulmonary disease ([place]) 04/28/2014  . 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 Adenocarcinoma of prostate ([place]) 05/22/2015  . AA (alcohol abuse) 02/28/2014  . 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 Apnea, sleep 02/28/2014  . Cellulitis 09/21/2014  . Elevated prostate specific antigen (PSA) 02/16/2015  . Lesion of external ear 04/28/2014    PAST SURGICAL HISTORY: Past Surgical History  Procedure Laterality Date  . Tonsillectomy      FAMILY HISTORY Family History  Problem Relation Age of Onset  . Heart attack Mother   . Heart disease Mother   . Heart attack Father   . Kidney disease Neg Hx   . Prostate cancer Neg Hx         ADVANCED DIRECTIVES:  Patient does have advance healthcare directive, Patient   does not desire to make any changes  HEALTH MAINTENANCE: Social History  Substance Use Topics  . Smoking status: Current Every Day Smoker  . Smokeless tobacco: None  . Alcohol Use: 0.0 oz/week    0 Standard drinks or equivalent per week     Comment: 1/2 gallon per week    :  No Known Allergies  Current Outpatient Prescriptions  Medication Sig Dispense Refill  . albuterol (VENTOLIN HFA) 108 (90 BASE) MCG/ACT inhaler Inhale into the lungs.    Marland Kitchen amLODipine (NORVASC) 5 MG tablet Take by mouth.    Marland Kitchen ipratropium-albuterol (DUONEB) 0.5-2.5 (3) MG/3ML SOLN     . lisinopril-hydrochlorothiazide (PRINZIDE,ZESTORETIC) 20-12.5 MG per tablet Take by mouth.    . tiotropium (SPIRIVA) 18 MCG inhalation capsule Place into inhaler and inhale.    . ipratropium-albuterol (DUONEB) 0.5-2.5 (3) MG/3ML SOLN Inhale into the lungs.     No current facility-administered medications for this visit.    OBJECTIVE: PHYSICAL EXAM: Moderately obese individual.  Not in any acute distress. Lymphatic system: Supraclavicular, cervical, axillary, inguinal lymph nodes are not  palpable Chest: Emphysematous chest bilateral rhonchi Heart: Distant heart sounds ,  soft systolic murmur Abdominal exam revealed normal bowel sounds. The abdomen was soft, non-tender, and without masses, organomegaly, or appreciable enlargement of the abdominal aorta.. Examination of the skin revealed no evidence of significant rashes, suspicious appearing nevi or other concerning lesions. Lower extremity no edema Skin: No rash. Neurologically, the patient was awake, alert, and oriented to person, place and time. There were no obvious focal neurologic abnormalities. All other systems have been examined. GU: Was not examined because at a frequent rectal examination done    Filed Vitals:   08/22/15 1429 08/22/15 1431  BP: 155/92   Pulse: 77   Temp: 97 F (36.1 C)   Resp:  18     Body mass index is 32.34 kg/(m^2).    ECOG FS:1 - Symptomatic but completely ambulatory  LAB RESULTS:  Appointment on 08/22/2015  Component Date Value Ref Range Status  . WBC 08/22/2015 6.3  3.8 - 10.6 K/uL Final  . RBC 08/22/2015 4.92  4.40 - 5.90 MIL/uL Final  .  Hemoglobin 08/22/2015 15.2  13.0 - 18.0 g/dL Final  . HCT 08/22/2015 45.3  40.0 - 52.0 % Final  . MCV 08/22/2015 92.1  80.0 - 100.0 fL Final  . MCH 08/22/2015 30.8  26.0 - 34.0 pg Final  . MCHC 08/22/2015 33.5  32.0 - 36.0 g/dL Final  . RDW 08/22/2015 13.2  11.5 - 14.5 % Final  . Platelets 08/22/2015 159  150 - 440 K/uL Final  . Neutrophils Relative % 08/22/2015 68   Final  . Neutro Abs 08/22/2015 4.3  1.4 - 6.5 K/uL Final  . Lymphocytes Relative 08/22/2015 20   Final  . Lymphs Abs 08/22/2015 1.3  1.0 - 3.6 K/uL Final  . Monocytes Relative 08/22/2015 9   Final  . Monocytes Absolute 08/22/2015 0.6  0.2 - 1.0 K/uL Final  . Eosinophils Relative 08/22/2015 3   Final  . Eosinophils Absolute 08/22/2015 0.2  0 - 0.7 K/uL Final  . Basophils Relative 08/22/2015 0   Final  . Basophils Absolute 08/22/2015 0.0  0 - 0.1 K/uL Final  . Sodium 08/22/2015  135  135 - 145 mmol/L Final  . Potassium 08/22/2015 3.8  3.5 - 5.1 mmol/L Final  . Chloride 08/22/2015 97* 101 - 111 mmol/L Final  . CO2 08/22/2015 30  22 - 32 mmol/L Final  . Glucose, Bld 08/22/2015 120* 65 - 99 mg/dL Final  . BUN 08/22/2015 15  6 - 20 mg/dL Final  . Creatinine, Ser 08/22/2015 1.01  0.61 - 1.24 mg/dL Final  . Calcium 08/22/2015 9.3  8.9 - 10.3 mg/dL Final  . Total Protein 08/22/2015 7.6  6.5 - 8.1 g/dL Final  . Albumin 08/22/2015 4.5  3.5 - 5.0 g/dL Final  . AST 08/22/2015 20  15 - 41 U/L Final  . ALT 08/22/2015 13* 17 - 63 U/L Final  . Alkaline Phosphatase 08/22/2015 52  38 - 126 U/L Final  . Total Bilirubin 08/22/2015 0.5  0.3 - 1.2 mg/dL Final  . GFR calc non Af Amer 08/22/2015 >60  >60 mL/min Final  . GFR calc Af Amer 08/22/2015 >60  >60 mL/min Final   Comment: (NOTE) The eGFR has been calculated using the CKD EPI equation. This calculation has not been validated in all clinical situations. eGFR's persistently <60 mL/min signify possible Chronic Kidney Disease.   . Anion gap 08/22/2015 8  5 - 15 Final     STUDIES: Ct Chest Wo Contrast  08/18/2015  CLINICAL DATA:  Prostate cancer with osseous metastatic disease. EXAM: CT CHEST WITHOUT CONTRAST TECHNIQUE: Multidetector CT imaging of the chest was performed following the standard protocol without IV contrast. COMPARISON:  08/08/2015 FINDINGS: Mediastinum/Nodes: Coronary, aortic arch, and branch vessel atherosclerotic vascular disease. No pathologic thoracic adenopathy. Heart size normal. Lungs/Pleura: Considerable centrilobular emphysema. 4 mm right lower lobe pulmonary nodule, image 50 series 4. Upper abdomen: Small fluid density lesion in the caudate lobe, image 65 series 2, likely a small benign cyst. Musculoskeletal: Several old left lower rib fractures appear healed. These do not have characteristics to suggest pathologic fractures. Medial expansion of the left ninth rib near the costovertebral margin with adjacent  mild sclerosis in the vertebra. This is probably deformity due to an old fracture, less likely to be related to malignancy. There is some associated bridging spurring at the joint. Multilevel Schmorl'  s nodes. IMPRESSION: 1. No convincing evidence of metastatic disease in the thorax. 2. Coronary, aortic arch, and branch vessel atherosclerotic vascular disease. 3. Centrilobular emphysema. 4. 4 mm right  lower lobe pulmonary nodule. Standard guidelines for follow up of this nodule did not apply due to the history of prostate cancer. However, consider follow up chest CT in 1 years time if not earlier. 5. Multiple left-sided rib fractures have a benign appearance. The callus associated with the medial left ninth rib is less certain but most likely posttraumatic. MRI may be helpful in workup of the endplate lucencies in the lumbar spine, which could conceivably be malignant but which might simply be from Schmorl's nodes. Electronically Signed   By: Van Clines M.D.   On: 08/18/2015 11:36   Nm Bone Scan Whole Body  08/18/2015  CLINICAL DATA:  Bone metastases. Prostate cancer with lytic bone lesions on CT. EXAM: NUCLEAR MEDICINE WHOLE BODY BONE SCAN TECHNIQUE: Whole body anterior and posterior images were obtained approximately 3 hours after intravenous injection of radiopharmaceutical. RADIOPHARMACEUTICALS:  22.45 mCi Technetium-23mMDP IV COMPARISON:  Abdominal CT 08/08/2015.  Chest CT from earlier today FINDINGS: Normal bio distribution with symmetric renal activity. There is no uptake in the spine to correlate with findings reported on abdominal CT 08/08/2015. Linearly arranged posterior left rib foci of activity best seen in the tenth, seventh, and sixth ribs is attributed to fractures, with imaging correlate on previous CT at the tenth rib at least. Typical degenerative changes in the feet and shoulders. IMPRESSION: 1. No metastatic pattern activity and no findings to correlate with lumbar spine lucencies  on abdominal CT 08/08/2015. These lucencies could be degenerative or from marrow heterogeneity and MRI is recommended to differentiate lesion from pseudolesion. 2. Remote left posterior rib fractures. Electronically Signed   By: JMonte FantasiaM.D.   On: 08/18/2015 14:04   Ct Abdomen Pelvis W Contrast  08/08/2015  CLINICAL DATA:  Prostate carcinoma EXAM: CT ABDOMEN AND PELVIS WITH CONTRAST TECHNIQUE: Multidetector CT imaging of the abdomen and pelvis was performed using the standard protocol following bolus administration of intravenous contrast. CONTRAST:  1099mOMNIPAQUE IOHEXOL 300 MG/ML  SOLN COMPARISON:  None. FINDINGS: Gallbladder, spleen, pancreas, adrenal glands are within normal limits 12 mm low-density lesion in the caudate lobe of the liver is likely a small cyst. Bilateral perinephric stranding without hydronephrosis or delayed contrast excretion is nonspecific. It is more prominent about the left kidney. No renal mass. Duplicated IVC. Umbilical hernia contains adipose tissue. Prostate is unremarkable. Mild anterior bladder wall thickening is nonspecific. Left inguinal hernia contains adipose. Mild atherosclerotic changes of the aorta. There are subtle patchy areas in the femoral heads bilaterally. Avascular necrosis is not excluded. There are multiple lytic lesions in the L2, L3, and L4 vertebral bodies. No vertebral compression deformity. IMPRESSION: Multiple lumbar lytic bone lesions worrisome for metastatic disease. MRI may be helpful. Bilateral perinephric stranding left greater than right is nonspecific and may be chronic. Low-density lesion in the liver is favored to represent a small cyst. Possible avascular necrosis in the femoral heads bilaterally. Electronically Signed   By: ArMarybelle Killings.D.   On: 08/08/2015 15:46    ASSESSMENT:  Adenocarcinoma of prostate clinically stage is T2 N0 M0 tumor PSA 6.2 Overall Gleason grade is 3+3. Overall patient's risk is low risk or low to  intermediate risk Comorbid condition Moderate obesity Chronic tobacco abuse.  Does not want to quit smoking. COPD Sleep apnea. 2.  4 mm pulmonary nodule in view of patient chronic tobacco abuse these to be followed  As per recommendation repeat CT scan without contrast in 6-12 months  3.  CT scan and bone  scan has been reviewed.  Considering importance of establishing that the patient and the bone metastases or not to decide on further therapy we would like to proceed with MRI scan of the lumbar spine  If it is negative then possibility of local therapy with either radiation or prostatectomy may be an appropriate approach.  Patient may not be a candidate for neoadjuvant ADT because of low intermediate risk. Reevaluate patient after MRI scan and we will discuss that in tumor conference  Patient expressed understanding and was in agreement with this plan. He also understands that He can call clinic at any time with any questions, concerns, or complaints.    Adenocarcinoma of prostate [facility])   Staging form: Prostate, AJCC 7th Edition     Clinical: T2, N0, M0 - Unsigned   Forest Gleason, MD   08/22/2015 4:38 PM

## 2015-08-24 ENCOUNTER — Telehealth: Payer: Self-pay | Admitting: Oncology

## 2015-08-24 NOTE — Telephone Encounter (Signed)
Pt is scheduled for MRI of lumbar spine w/and w/out contrast tomorrow (08/25/15). His insurance requires pre-auth but the code that was pre-authorized is not the one on the order. They got code [phone] authorized but the order says [phone]. Need auth for that code and please withdraw the one for [phone]. Thanks.

## 2015-08-25 ENCOUNTER — Ambulatory Visit: Admission: RE | Admit: 2015-08-25 | Payer: Self-pay | Source: Ambulatory Visit

## 2015-08-25 ENCOUNTER — Ambulatory Visit (INDEPENDENT_AMBULATORY_CARE_PROVIDER_SITE_OTHER): Payer: Commercial Managed Care - HMO | Admitting: Urology

## 2015-08-25 ENCOUNTER — Encounter: Payer: Self-pay | Admitting: Urology

## 2015-08-25 VITALS — BP 142/87 | HR 76 | Ht 72.0 in | Wt 238.3 lb

## 2015-08-25 DIAGNOSIS — F17200 Nicotine dependence, unspecified, uncomplicated: Secondary | ICD-10-CM

## 2015-08-25 DIAGNOSIS — J439 Emphysema, unspecified: Secondary | ICD-10-CM

## 2015-08-25 DIAGNOSIS — M5127 Other intervertebral disc displacement, lumbosacral region: Secondary | ICD-10-CM | POA: Diagnosis not present

## 2015-08-25 DIAGNOSIS — C61 Malignant neoplasm of prostate: Secondary | ICD-10-CM

## 2015-08-25 DIAGNOSIS — R972 Elevated prostate specific antigen [PSA]: Secondary | ICD-10-CM | POA: Diagnosis not present

## 2015-08-25 DIAGNOSIS — K429 Umbilical hernia without obstruction or gangrene: Secondary | ICD-10-CM | POA: Diagnosis not present

## 2015-08-25 DIAGNOSIS — Z720 Tobacco use: Secondary | ICD-10-CM | POA: Diagnosis not present

## 2015-08-25 DIAGNOSIS — M5136 Other intervertebral disc degeneration, lumbar region: Secondary | ICD-10-CM | POA: Diagnosis not present

## 2015-08-25 DIAGNOSIS — M5126 Other intervertebral disc displacement, lumbar region: Secondary | ICD-10-CM | POA: Diagnosis not present

## 2015-08-27 ENCOUNTER — Encounter: Payer: Self-pay | Admitting: Urology

## 2015-08-27 NOTE — Progress Notes (Signed)
08/25/15 4 pm  Robert Powell 11/28/1950 [id number]  Referring provider: Glendon Axe, MD [facility] [place], [place] [address]  Chief Complaint  Patient presents with  . Results    CT scan and Bone scan    HPI: 64 year old male diagnosed with primarily Gleason 3+3 and single core of 3+4 prostate cancer in 04/2015 involving 7/12 cores bilaterally with largest burden involving 100% of the left lateral core with Gleason 3+4 (only 5% or less of 4 component).  PSA 6.2 at the time of dx. 35 cc prostate with firm nodule of the left mid gland.  TRUS ultrasound with diffuse prostatic calcifications and hypoechoic area at site of nodule.     CT abd/ pelvis showed multiple lytic lesions in L2 L3 L4 vertebral bodies.  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 There is no obvious pelvic lymphadenopathy.   As part of follow-up, he underwent bone scan which was negative for any evidence of metastatic disease. He also had chest imaging given his history of extensive smoking which showed a very small stable pulmonary nodule. He had a follow-up MRI today at [facility] of the spine which shows    He's also been referred and seen by oncologist Dr. Oliva Bustard and case discussed at tumor board.    No baseline voiding issues. He does have mild erectile dysfunction with occasional difficulty maintaining an erection. He is not particularly sexually active at this time due to his wife's sexual issues.    He does drink substaintial EtOH (blended whisky, approximately 1 gallon per week).  He also smokes 1 ppd x 50 years.    No previous abdominal surgeries.    BMI 32  PMH: Past Medical History  Diagnosis Date  . Elevated PSA     6.7 on 02/16/2015  . Hypertension   . COPD (chronic obstructive pulmonary disease) ([place])   . Hyperlipidemia   . Sleep apnea   . Narcolepsy   . Alcohol abuse   . Genital herpes   . Tobacco abuse   . Rhinitis   . Cancer ([place])   .  Benign essential HTN 02/17/2015  . Essential (primary) hypertension 02/18/2015  . Chronic obstructive pulmonary disease ([place]) 04/28/2014  . 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 Adenocarcinoma of prostate ([place]) 05/22/2015  . AA (alcohol abuse) 02/28/2014  . 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 Apnea, sleep 02/28/2014  . Cellulitis 09/21/2014  . Elevated prostate specific antigen (PSA) 02/16/2015  . Lesion of external ear 04/28/2014    Surgical History: Past Surgical History  Procedure Laterality Date  . Tonsillectomy      Home Medications:    Medication List       This list is accurate as of: 08/25/15 11:59 PM.  Always use your most recent med list.               amLODipine 5 MG tablet  Commonly known as:  NORVASC  Take by mouth.     ipratropium-albuterol 0.5-2.5 (3) MG/3ML Soln  Commonly known as:  DUONEB  Inhale into the lungs.     ipratropium-albuterol 0.5-2.5 (3) MG/3ML Soln  Commonly known as:  DUONEB     lisinopril-hydrochlorothiazide 20-12.5 MG tablet  Commonly known as:  PRINZIDE,ZESTORETIC  Take by mouth.     tiotropium 18 MCG inhalation capsule  Commonly known as:  [place] into inhaler and inhale.     VENTOLIN HFA 108 (  90 BASE) MCG/ACT inhaler  Generic drug:  albuterol  Inhale into the lungs.        Allergies: No Known Allergies  Family History: Family History  Problem Relation Age of Onset  . Heart attack Mother   . Heart disease Mother   . Heart attack Father   . Kidney disease Neg Hx   . Prostate cancer Neg Hx     Social History:  reports that he has been smoking.  He does not have any smokeless tobacco history on file. He reports that he drinks alcohol. He reports that he does not use illicit drugs.  ROS 12 point review of systems was performed and is otherwise negative.  Physical Exam: BP 142/87 mmHg  Pulse 76  Ht 6' (1.829 m)  Wt 238 lb 4.8 oz (108.092 kg)  BMI 32.31 kg/m2    Constitutional:  Alert and oriented, No acute distress.  Presents today to the office with his wife. HEENT: [place] AT, moist mucus membranes.  Trachea midline, no masses.  Slightly erythematous sclera. Cardiovascular: No clubbing, cyanosis, or edema. Respiratory: Normal respiratory effort, no increased work of breathing. GI: Abdomen is soft, nontender, nondistended, no abdominal masses.  Obese. Diastasis of his rectus muscles noted. Reducible umbilical hernia. GU: No CVA tenderness. Skin: No rashes, bruises or suspicious lesions.  Fascial telangiectasia. Neurologic: Grossly intact, no focal deficits, moving all 4 extremities. Psychiatric: Normal mood and affect.  Laboratory Data: Comprehensive Metabolic Panel (CMP) - Final result (02/16/2015 3:59 PM) Comprehensive Metabolic Panel (CMP) - Final result (02/16/2015 3:59 PM)  Component Value Range  Glucose 113 (H) 70-110 mg/dL  Sodium 132 (L) 136-145 mmol/L  Potassium 3.7 3.6-5.1 mmol/L  Chloride 95 (L) 97-109 mmol/L  Carbon Dioxide (CO2) 30.4 22.0-32.0 mmol/L  Urea Nitrogen (BUN) 17 7-25 mg/dL  Creatinine 1.1 0.7-1.3 mg/dL  Glomerular Filtration Rate (eGFR), MDRD Estimate 67 >60 mL/min/1.73sq m  Calcium 9.4 8.7-10.3 mg/dL  AST  20 8-39 U/L  ALT  19 6-57 U/L  Alk Phos (alkaline Phosphatase) 47 34-104 U/L  Albumin 4.3 3.5-4.8 g/dL  Bilirubin, Total 0.4 0.3-1.2 mg/dL  Protein, Total 7.0 6.1-7.9 g/dL  A/G Ratio 1.6     Complete Blood Count (CBC) - Final result (02/16/2015 3:59 PM) Complete Blood Count (CBC) - Final result (02/16/2015 3:59 PM)  Component Value Range  WBC (White Blood Cell Count) 6.7 4.1-10.2 10^3/uL  RBC (Red Blood Cell Count) 4.72 4.69-6.13 10^6/uL  Hemoglobin 15.3 14.1-18.1 gm/dL  Hematocrit 43.7 40.0-52.0 %  MCV (Mean Corpuscular Volume) 92.6 80.0-100.0 fl  MCH (Mean Corpuscular Hemoglobin) 32.4 (H) 27.0-31.2 pg  MCHC (Mean Corpuscular Hemoglobin Concentration) 35.0 32.0-36.0 gm/dL  Platelet Count 187 150-450  10^3/uL  RDW-CV (Red Cell Distribution Width) 13.0 11.6-14.8 %  MPV (Mean Platelet Volume) 8.9 8.0-10.0 fl     Lab Results  Component Value Date   PSA 6.1* 03/14/2015   PSA 6.2* 03/14/2015    Pertinent Imaging:            Vitals     Height Weight BMI (Calculated)    6' (1.829 m) 236 lb 14.4 oz (107.457 kg) 32.2      Interpretation Summary     CLINICAL DATA: Prostate carcinoma  EXAM: CT ABDOMEN AND PELVIS WITH CONTRAST  TECHNIQUE: Multidetector CT imaging of the abdomen and pelvis was performed using the standard protocol following bolus administration of intravenous contrast.  CONTRAST: 148m OMNIPAQUE IOHEXOL 300 MG/ML SOLN  COMPARISON: None.  FINDINGS: Gallbladder, spleen, pancreas, adrenal glands are within normal  limits  12 mm low-density lesion in the caudate lobe of the liver is likely a small cyst.  Bilateral perinephric stranding without hydronephrosis or delayed contrast excretion is nonspecific. It is more prominent about the left kidney. No renal mass.  Duplicated IVC.  Umbilical hernia contains adipose tissue.  Prostate is unremarkable. Mild anterior bladder wall thickening is nonspecific.  Left inguinal hernia contains adipose.  Mild atherosclerotic changes of the aorta.  There are subtle patchy areas in the femoral heads bilaterally. Avascular necrosis is not excluded.  There are multiple lytic lesions in the L2, L3, and L4 vertebral bodies. No vertebral compression deformity.  IMPRESSION: Multiple lumbar lytic bone lesions worrisome for metastatic disease. MRI may be helpful.  Bilateral perinephric stranding left greater than right is nonspecific and may be chronic.  Low-density lesion in the liver is favored to represent a small cyst.  Possible avascular necrosis in the femoral heads bilaterally.   Electronically Signed  By: Marybelle Killings M.D.  On: 08/08/2015 15:46   CLINICAL  DATA: Prostate cancer with osseous metastatic disease.  EXAM: CT CHEST WITHOUT CONTRAST  TECHNIQUE: Multidetector CT imaging of the chest was performed following the standard protocol without IV contrast.  COMPARISON: 08/08/2015  FINDINGS: Mediastinum/Nodes: Coronary, aortic arch, and branch vessel atherosclerotic vascular disease. No pathologic thoracic adenopathy. Heart size normal.  Lungs/Pleura: Considerable centrilobular emphysema. 4 mm right lower lobe pulmonary nodule, image 50 series 4.  Upper abdomen: Small fluid density lesion in the caudate lobe, image 65 series 2, likely a small benign cyst.  Musculoskeletal: Several old left lower rib fractures appear healed. These do not have characteristics to suggest pathologic fractures. Medial expansion of the left ninth rib near the costovertebral margin with adjacent mild sclerosis in the vertebra. This is probably deformity due to an old fracture, less likely to be related to malignancy. There is some associated bridging spurring at the joint.  Multilevel Schmorl' s nodes.  IMPRESSION: 1. No convincing evidence of metastatic disease in the thorax. 2. Coronary, aortic arch, and branch vessel atherosclerotic vascular disease. 3. Centrilobular emphysema. 4. 4 mm right lower lobe pulmonary nodule. Standard guidelines for follow up of this nodule did not apply due to the history of prostate cancer. However, consider follow up chest CT in 1 years time if not earlier. 5. Multiple left-sided rib fractures have a benign appearance. The callus associated with the medial left ninth rib is less certain but most likely posttraumatic. MRI may be helpful in workup of the endplate lucencies in the lumbar spine, which could conceivably be malignant but which might simply be from Schmorl's nodes.   Electronically Signed  By: Van Clines M.D.  On: 08/18/2015 11:36   BONE SCAN CLINICAL DATA: Bone  metastases. Prostate cancer with lytic bone lesions on CT.  EXAM: NUCLEAR MEDICINE WHOLE BODY BONE SCAN   IMPRESSION: 1. No metastatic pattern activity and no findings to correlate with lumbar spine lucencies on abdominal CT 08/08/2015. These lucencies could be degenerative or from marrow heterogeneity and MRI is recommended to differentiate lesion from pseudolesion. 2. Remote left posterior rib fractures.   Electronically Signed  By: Monte Fantasia M.D.  On: 08/18/2015 14:04   Spinal MRI EXAM INFO: [id number] UN 08/25/15 11:15:[id number] [facility]) : MRI LUMBAR SPINE W WO CONTRAST  DICTATED: 08/25/15 12:49:15  CLINICAL INDICATION: 64 year old M. R97.20 - Elevated PSA.  COMPARISON: None available.  TECHNIQUE: Multiplanar MRI was performed through the lumbar spine prior to and following intravenous contrast administration.   FINDINGS: For  the purposes of this dictation, the lowest well formed intervertebral disc space is assumed to be the L5-S1 level, and there are presumed to be five lumbar-type vertebral bodies.  The vertebral bodies are normally aligned. Vertebral body heights are preserved. Increased STIR signal is noted along the superior and inferior L3 and superior L4 endplates. Smaller areas of signal abnormality are also noted around the T11-12 intervertebral disc space. These areas demonstrate associated enhancement following contrast administration.  At L1-2, there is no significant canal or neural foraminal stenosis.  L2-3: There is disc desiccation, and into disc bulge, and mild ligamentum flavum thickening. No significant canal or neural foraminal stenosis.  At L3-4, there is a diffuse disc bulge and ligamentum flavum thickening. No significant canal stenosis. There is mild to moderate bilateral neural foraminal narrowing.  At L4-5, there is a disc bulge with facet degeneration and ligamentum flavum thickening. No significant central canal stenosis. There is  moderate bilateral neural foraminal narrowing.  At L5-S1, there is disc desiccation and a mild disc bulge with facet degeneration. As the canal stenosis. There is moderate right and mild left neural foraminal narrowing. Likely conjoined L5 and S1 nerve roots on the right.  The spinal cord is normal in signal intensity and the conus medullaris terminates at normal level. The nerve roots of the cauda equina are normal..   INTERPRETATION LOCATION: [place]  IMPRESSION: 1. Signal abnormality and enhancement involving the superior and inferior L3 and superior L4 endplates and to a lesser extent the inferior inferior T11 and superior T12 endplates. These are favored to be due to disc degeneration rather than metastatic disease.  2. Multilevel degenerative disc disease, as described above   Assessment & Plan:  64 year old man with relatively high volume intermediate risk prostate cancer here today to discuss possible prostatectomy.  Further workup of spinal lesions indicate less likely to represent metastatic disease per bone scan/MRI.     1. Prostate cancer [facility]) The patient was counseled about the natural history of prostate cancer and the standard treatment options that are available for prostate cancer. It was explained to him how his age and life expectancy, clinical stage, Gleason score, and PSA affect his prognosis, the decision to proceed with additional staging studies, as well as how that information influences recommended treatment strategies.  Metastatic workup as above is negative for metastasis, presumed diseases organ confined. We discussed the roles for active surveillance, radiation therapy, surgical therapy, androgen deprivation, as well as ablative therapy options for the treatment of prostate cancer as appropriate to his individual cancer situation. We discussed the risks and benefits of these options with regard to their impact on cancer control and also in terms of potential  adverse events, complications, and impact on quality of life particularly related to urinary, bowel, and sexual function. The patient was encouraged to ask questions throughout the discussion today and all questions were answered to his stated satisfaction. In addition, the patient was providedwith and/or directed to appropriate resources and literature for further education about prostate cancer treatment options.  We discussed surgical therapy for prostate cancer including the different available surgical approaches. We discussed, in detail, the risks and expectations of surgery with regard to cancer control, urinary control, and erectile dysfunction as well as expected post operative cover he processed. Additional risks of surgery including but not limalited to bleeding, infection, hernia formation, nerve damage, steel formation, bowel/rect injury, potentially necessitating colostomy, damage to the urinary tract resulting in urinary leakage, urethral stricture, and cardiopulmonary  risk such as myocardial infarction, stroke, death, thromboembolism etc. were explained. The risk of open surgical conversion for robotics/laparoscopic prostatectomy is also discussed.  Unfortunately, in the setting of COPD with a fairly protuberant abdomen, I explained that there is a possibility that he may not be able to tolerate steep Trendelenburg if his peak pressures are too high. He understands that there is a possibility that we could need to abort the procedure. Additionally, we discussed today that he is a higher risk for stress urinary incontinence given his habitus and age. I would likely not pursue nerve sparing given the extent of his disease and palpable nodule on exam.  Consequences of this were also discussed including impotence.  We also discussed today radiation and I have encouraged him to go see Dr. Baruch Gouty radiation oncology.  He will call us and let us know if he like to proceed with surgery. Again he was  offered a second surgical opinion as well but declined.  2.  COPD Will need pulmonary clearance prior to surgery with optimization.  3. Smoker Extensive smoking history, cessation discussed today.  I explained that each day that he doesn't smoke prior to her surgery could potentially help improve his outcomes.  4. Umbicial hernia Will repair at time of surgery if possible should he elect surgery   Will call to f/u with patient after radiation oncology consult.   Hollice Espy, MD  [facility] [address], [place], [place] [address] [phone]  I spent 30 min with this patient of which greater than 50% was spent in counseling and coordination of care with the patient.

## 2015-08-28 ENCOUNTER — Inpatient Hospital Stay (HOSPITAL_BASED_OUTPATIENT_CLINIC_OR_DEPARTMENT_OTHER): Payer: Commercial Managed Care - HMO | Admitting: Oncology

## 2015-08-28 ENCOUNTER — Inpatient Hospital Stay: Payer: Commercial Managed Care - HMO

## 2015-08-28 ENCOUNTER — Other Ambulatory Visit: Payer: Self-pay

## 2015-08-28 VITALS — BP 165/90 | HR 77 | Temp 96.9°F | Wt 238.5 lb

## 2015-08-28 DIAGNOSIS — M899 Disorder of bone, unspecified: Secondary | ICD-10-CM | POA: Diagnosis not present

## 2015-08-28 DIAGNOSIS — J449 Chronic obstructive pulmonary disease, unspecified: Secondary | ICD-10-CM | POA: Diagnosis not present

## 2015-08-28 DIAGNOSIS — C61 Malignant neoplasm of prostate: Secondary | ICD-10-CM

## 2015-08-28 DIAGNOSIS — E669 Obesity, unspecified: Secondary | ICD-10-CM | POA: Diagnosis not present

## 2015-08-28 DIAGNOSIS — F17210 Nicotine dependence, cigarettes, uncomplicated: Secondary | ICD-10-CM

## 2015-08-28 DIAGNOSIS — Z79899 Other long term (current) drug therapy: Secondary | ICD-10-CM

## 2015-08-28 DIAGNOSIS — R911 Solitary pulmonary nodule: Secondary | ICD-10-CM

## 2015-08-28 DIAGNOSIS — M5136 Other intervertebral disc degeneration, lumbar region: Secondary | ICD-10-CM | POA: Diagnosis not present

## 2015-08-28 DIAGNOSIS — I10 Essential (primary) hypertension: Secondary | ICD-10-CM | POA: Diagnosis not present

## 2015-08-28 NOTE — Progress Notes (Signed)
Patient here for MRI results. 

## 2015-08-29 ENCOUNTER — Encounter: Payer: Self-pay | Admitting: Oncology

## 2015-08-29 LAB — MULTIPLE MYELOMA PANEL, SERUM
ALPHA 1: 0.3 g/dL (ref 0.0–0.4)
ALPHA2 GLOB SERPL ELPH-MCNC: 0.8 g/dL (ref 0.4–1.0)
Albumin SerPl Elph-Mcnc: 3.8 g/dL (ref 2.9–4.4)
Albumin/Glob SerPl: 1.2 (ref 0.7–1.7)
B-GLOBULIN SERPL ELPH-MCNC: 1.1 g/dL (ref 0.7–1.3)
Gamma Glob SerPl Elph-Mcnc: 1.1 g/dL (ref 0.4–1.8)
Globulin, Total: 3.2 g/dL (ref 2.2–3.9)
IGG (IMMUNOGLOBIN G), SERUM: 915 mg/dL (ref 700–1600)
IgA: 296 mg/dL (ref 61–437)
IgM, Serum: 35 mg/dL (ref 20–172)
TOTAL PROTEIN ELP: 7 g/dL (ref 6.0–8.5)

## 2015-08-29 LAB — PSA: PSA: 7.78 ng/mL — AB (ref 0.00–4.00)

## 2015-08-29 LAB — KAPPA/LAMBDA LIGHT CHAINS
Kappa free light chain: 27.65 mg/L — ABNORMAL HIGH (ref 3.30–19.40)
Kappa, lambda light chain ratio: 1.6 (ref 0.26–1.65)
LAMDA FREE LIGHT CHAINS: 17.25 mg/L (ref 5.71–26.30)

## 2015-08-29 NOTE — Progress Notes (Signed)
[place] @ [facility] [phone]  Fax:([phone]) [place]: 11-02-1950  MR#: [id number]  GNF#:[id number]  Patient Care Team: Glendon Axe, MD as PCP - General (Internal Medicine) Glendon Axe, MD (Internal Medicine)  CHIEF COMPLAINT: /oncology history  Carcinoma of prostate Palpable nodule on physical examination. 64 year old male diagnosed with primarily Gleason 3+3 and single core of 3+4 prostate cancer in 04/2015 involving 7/12 cores bilaterally with largest burden involving 100% of the left lateral core with Gleason 3+4 (only 5% or less of 4 component). PSA 6.2 at the time of dx. 35 cc prostate with firm nodule of the left mid gland. TRUS ultrasound with diffuse prostatic calcifications and hypoechoic area at site of nodule.    VISIT DIAGNOSIS:     ICD-9-CM ICD-10-CM   1. Adenocarcinoma of prostate ([place]) 185 C61 Kappa/lambda light chains     PSA     Multiple Myeloma Panel (SPEP&IFE w/QIG)      No history exists.    No flowsheet data found.  INTERVAL HISTORY:   64 year old gentleman who had    evaluation in August and biopsy was positive for adenocarcinoma of prostate.  Patient did not follow-up any further appointment with radiation oncologist for another opinion. . August 28, 2015 Patient is here after getting MRI scan of the spine. MRI scan reveals Signal abnormality and enhancement involving the superior and inferior L3 and superior L4 endplates and to a lesser extent the inferior T11 and superior T12 endplate these are favored to be due to disc degeneration rather than metastases disease.. Multilevel degenerative disc disease.   HEENT:  No visual changes, runny nose, sore throat, mouth sores or tenderness. Lungs: No shortness of breath or cough.  No hemoptysis. Cardiac:  No chest pain, palpitations, orthopnea, or PND. GI:  No nausea, vomiting, diarrhea, constipation, melena or hematochezia. GU:  No urgency,  frequency, dysuria, or hematuria. Musculoskeletal:  No back pain.  No joint pain.  No muscle tenderness. Extremities:  No pain or swelling. Skin:  No rashes or skin changes. Neuro:  No headache, numbness or weakness, balance or coordination issues. Endocrine:  No diabetes, thyroid issues, hot flashes or night sweats. Psych:  No mood changes, depression or anxiety. Pain:  No focal pain. Review of systems:  All other systems reviewed and found to be negative.the right side which is 4 mm. Patient is now being evaluated here for further workup and rule out bone metastases. Patient denies any back pain.   REVIEW OF SYSTEMS:   GENERAL:  Feels good.  Active.  No fevers, sweats or weight loss. PERFORMANCE STATUS (ECOG):  01 HEENT:  No visual changes, runny nose, sore throat, mouth sores or tenderness. Lungs: No shortness of breath or cough.  No hemoptysis. Cardiac:  No chest pain, palpitations, orthopnea, or PND. GI:  No nausea, vomiting, diarrhea, constipation, melena or hematochezia. GU:  No urgency, frequency, dysuria, or hematuria. Musculoskeletal:  No back pain.  No joint pain.  No muscle tenderness. Extremities:  No pain or swelling. Skin:  No rashes or skin changes. Neuro:  No headache, numbness or weakness, balance or coordination issues. Endocrine:  No diabetes, thyroid issues, hot flashes or night sweats. Psych:  No mood changes, depression or anxiety. Pain:  No focal pain. Review of systems:  All other systems reviewed and found to be negative.  As per HPI. Otherwise, a complete review of systems is negatve.  PAST MEDICAL HISTORY: Past Medical History  Diagnosis Date  .  Elevated PSA     6.7 on 02/16/2015  . Hypertension   . COPD (chronic obstructive pulmonary disease) ([place])   . Hyperlipidemia   . Sleep apnea   . Narcolepsy   . Alcohol abuse   . Genital herpes   . Tobacco abuse   . Rhinitis   . Cancer ([place])   . Benign essential HTN 02/17/2015  . Essential (primary)  hypertension 02/18/2015  . Chronic obstructive pulmonary disease ([place]) 04/28/2014  . 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 Adenocarcinoma of prostate ([place]) 05/22/2015  . AA (alcohol abuse) 02/28/2014  . 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 Apnea, sleep 02/28/2014  . Cellulitis 09/21/2014  . Elevated prostate specific antigen (PSA) 02/16/2015  . Lesion of external ear 04/28/2014    PAST SURGICAL HISTORY: Past Surgical History  Procedure Laterality Date  . Tonsillectomy      FAMILY HISTORY Family History  Problem Relation Age of Onset  . Heart attack Mother   . Heart disease Mother   . Heart attack Father   . Kidney disease Neg Hx   . Prostate cancer Neg Hx         ADVANCED DIRECTIVES:  Patient does have advance healthcare directive, Patient   does not desire to make any changes  HEALTH MAINTENANCE: Social History  Substance Use Topics  . Smoking status: Current Every Day Smoker  . Smokeless tobacco: None  . Alcohol Use: 0.0 oz/week    0 Standard drinks or equivalent per week     Comment: 1/2 gallon per week    :  No Known Allergies  Current Outpatient Prescriptions  Medication Sig Dispense Refill  . albuterol (VENTOLIN HFA) 108 (90 BASE) MCG/ACT inhaler Inhale into the lungs.    Marland Kitchen amLODipine (NORVASC) 5 MG tablet Take by mouth.    Marland Kitchen ipratropium-albuterol (DUONEB) 0.5-2.5 (3) MG/3ML SOLN     . lisinopril-hydrochlorothiazide (PRINZIDE,ZESTORETIC) 20-12.5 MG per tablet Take by mouth.    . tiotropium (SPIRIVA) 18 MCG inhalation capsule Place into inhaler and inhale.    . ipratropium-albuterol (DUONEB) 0.5-2.5 (3) MG/3ML SOLN Inhale into the lungs.     No current facility-administered medications for this visit.    OBJECTIVE: PHYSICAL EXAM: Moderately obese individual.  Not in any acute distress. Lymphatic system: Supraclavicular, cervical, axillary, inguinal lymph nodes are not palpable Chest: Emphysematous chest  bilateral rhonchi Heart: Distant heart sounds ,  soft systolic murmur Abdominal exam revealed normal bowel sounds. The abdomen was soft, non-tender, and without masses, organomegaly, or appreciable enlargement of the abdominal aorta.. Examination of the skin revealed no evidence of significant rashes, suspicious appearing nevi or other concerning lesions. Lower extremity no edema Skin: No rash. Neurologically, the patient was awake, alert, and oriented to person, place and time. There were no obvious focal neurologic abnormalities. All other systems have been examined. GU: Was not examined because at a frequent rectal examination done    Filed Vitals:   08/28/15 1558  BP: 165/90  Pulse: 77  Temp: 96.9 F (36.1 C)     Body mass index is 32.34 kg/(m^2).    ECOG FS:1 - Symptomatic but completely ambulatory  LAB RESULTS:  No visits with results within 5 Day(s) from this visit. Latest known visit with results is:  Appointment on 08/22/2015  Component Date Value Ref Range Status  . WBC 08/22/2015 6.3  3.8 - 10.6 K/uL Final  . RBC 08/22/2015 4.92  4.40 - 5.90 MIL/uL Final  . Hemoglobin 08/22/2015  15.2  13.0 - 18.0 g/dL Final  . HCT 08/22/2015 45.3  40.0 - 52.0 % Final  . MCV 08/22/2015 92.1  80.0 - 100.0 fL Final  . MCH 08/22/2015 30.8  26.0 - 34.0 pg Final  . MCHC 08/22/2015 33.5  32.0 - 36.0 g/dL Final  . RDW 08/22/2015 13.2  11.5 - 14.5 % Final  . Platelets 08/22/2015 159  150 - 440 K/uL Final  . Neutrophils Relative % 08/22/2015 68   Final  . Neutro Abs 08/22/2015 4.3  1.4 - 6.5 K/uL Final  . Lymphocytes Relative 08/22/2015 20   Final  . Lymphs Abs 08/22/2015 1.3  1.0 - 3.6 K/uL Final  . Monocytes Relative 08/22/2015 9   Final  . Monocytes Absolute 08/22/2015 0.6  0.2 - 1.0 K/uL Final  . Eosinophils Relative 08/22/2015 3   Final  . Eosinophils Absolute 08/22/2015 0.2  0 - 0.7 K/uL Final  . Basophils Relative 08/22/2015 0   Final  . Basophils Absolute 08/22/2015 0.0  0 - 0.1  K/uL Final  . Sodium 08/22/2015 135  135 - 145 mmol/L Final  . Potassium 08/22/2015 3.8  3.5 - 5.1 mmol/L Final  . Chloride 08/22/2015 97* 101 - 111 mmol/L Final  . CO2 08/22/2015 30  22 - 32 mmol/L Final  . Glucose, Bld 08/22/2015 120* 65 - 99 mg/dL Final  . BUN 08/22/2015 15  6 - 20 mg/dL Final  . Creatinine, Ser 08/22/2015 1.01  0.61 - 1.24 mg/dL Final  . Calcium 08/22/2015 9.3  8.9 - 10.3 mg/dL Final  . Total Protein 08/22/2015 7.6  6.5 - 8.1 g/dL Final  . Albumin 08/22/2015 4.5  3.5 - 5.0 g/dL Final  . AST 08/22/2015 20  15 - 41 U/L Final  . ALT 08/22/2015 13* 17 - 63 U/L Final  . Alkaline Phosphatase 08/22/2015 52  38 - 126 U/L Final  . Total Bilirubin 08/22/2015 0.5  0.3 - 1.2 mg/dL Final  . GFR calc non Af Amer 08/22/2015 >60  >60 mL/min Final  . GFR calc Af Amer 08/22/2015 >60  >60 mL/min Final   Comment: (NOTE) The eGFR has been calculated using the CKD EPI equation. This calculation has not been validated in all clinical situations. eGFR's persistently <60 mL/min signify possible Chronic Kidney Disease.   . Anion gap 08/22/2015 8  5 - 15 Final     STUDIES: Ct Chest Wo Contrast  08/18/2015  CLINICAL DATA:  Prostate cancer with osseous metastatic disease. EXAM: CT CHEST WITHOUT CONTRAST TECHNIQUE: Multidetector CT imaging of the chest was performed following the standard protocol without IV contrast. COMPARISON:  08/08/2015 FINDINGS: Mediastinum/Nodes: Coronary, aortic arch, and branch vessel atherosclerotic vascular disease. No pathologic thoracic adenopathy. Heart size normal. Lungs/Pleura: Considerable centrilobular emphysema. 4 mm right lower lobe pulmonary nodule, image 50 series 4. Upper abdomen: Small fluid density lesion in the caudate lobe, image 65 series 2, likely a small benign cyst. Musculoskeletal: Several old left lower rib fractures appear healed. These do not have characteristics to suggest pathologic fractures. Medial expansion of the left ninth rib near the  costovertebral margin with adjacent mild sclerosis in the vertebra. This is probably deformity due to an old fracture, less likely to be related to malignancy. There is some associated bridging spurring at the joint. Multilevel Schmorl'  s nodes. IMPRESSION: 1. No convincing evidence of metastatic disease in the thorax. 2. Coronary, aortic arch, and branch vessel atherosclerotic vascular disease. 3. Centrilobular emphysema. 4. 4 mm right lower lobe  pulmonary nodule. Standard guidelines for follow up of this nodule did not apply due to the history of prostate cancer. However, consider follow up chest CT in 1 years time if not earlier. 5. Multiple left-sided rib fractures have a benign appearance. The callus associated with the medial left ninth rib is less certain but most likely posttraumatic. MRI may be helpful in workup of the endplate lucencies in the lumbar spine, which could conceivably be malignant but which might simply be from Schmorl's nodes. Electronically Signed   By: Van Clines M.D.   On: 08/18/2015 11:36   Nm Bone Scan Whole Body  08/18/2015  CLINICAL DATA:  Bone metastases. Prostate cancer with lytic bone lesions on CT. EXAM: NUCLEAR MEDICINE WHOLE BODY BONE SCAN TECHNIQUE: Whole body anterior and posterior images were obtained approximately 3 hours after intravenous injection of radiopharmaceutical. RADIOPHARMACEUTICALS:  22.45 mCi Technetium-57mMDP IV COMPARISON:  Abdominal CT 08/08/2015.  Chest CT from earlier today FINDINGS: Normal bio distribution with symmetric renal activity. There is no uptake in the spine to correlate with findings reported on abdominal CT 08/08/2015. Linearly arranged posterior left rib foci of activity best seen in the tenth, seventh, and sixth ribs is attributed to fractures, with imaging correlate on previous CT at the tenth rib at least. Typical degenerative changes in the feet and shoulders. IMPRESSION: 1. No metastatic pattern activity and no findings to  correlate with lumbar spine lucencies on abdominal CT 08/08/2015. These lucencies could be degenerative or from marrow heterogeneity and MRI is recommended to differentiate lesion from pseudolesion. 2. Remote left posterior rib fractures. Electronically Signed   By: JMonte FantasiaM.D.   On: 08/18/2015 14:04   Ct Abdomen Pelvis W Contrast  08/08/2015  CLINICAL DATA:  Prostate carcinoma EXAM: CT ABDOMEN AND PELVIS WITH CONTRAST TECHNIQUE: Multidetector CT imaging of the abdomen and pelvis was performed using the standard protocol following bolus administration of intravenous contrast. CONTRAST:  1031mOMNIPAQUE IOHEXOL 300 MG/ML  SOLN COMPARISON:  None. FINDINGS: Gallbladder, spleen, pancreas, adrenal glands are within normal limits 12 mm low-density lesion in the caudate lobe of the liver is likely a small cyst. Bilateral perinephric stranding without hydronephrosis or delayed contrast excretion is nonspecific. It is more prominent about the left kidney. No renal mass. Duplicated IVC. Umbilical hernia contains adipose tissue. Prostate is unremarkable. Mild anterior bladder wall thickening is nonspecific. Left inguinal hernia contains adipose. Mild atherosclerotic changes of the aorta. There are subtle patchy areas in the femoral heads bilaterally. Avascular necrosis is not excluded. There are multiple lytic lesions in the L2, L3, and L4 vertebral bodies. No vertebral compression deformity. IMPRESSION: Multiple lumbar lytic bone lesions worrisome for metastatic disease. MRI may be helpful. Bilateral perinephric stranding left greater than right is nonspecific and may be chronic. Low-density lesion in the liver is favored to represent a small cyst. Possible avascular necrosis in the femoral heads bilaterally. Electronically Signed   By: ArMarybelle Killings.D.   On: 08/08/2015 15:46    ASSESSMENT:  Adenocarcinoma of prostate clinically stage is T2 N0 M0 tumor PSA 6.2 Overall Gleason grade is 3+3. Overall  patient's risk is low risk or low to intermediate risk Comorbid condition Moderate obesity Chronic tobacco abuse.  Does not want to quit smoking. COPD Sleep apnea. 2.  4 mm pulmonary nodule in view of patient chronic tobacco abuse these to be followed  As per recommendation repeat CT scan without contrast in 6-12 months  3.  CT scan and bone scan has  been reviewed.  Considering importance of establishing that the patient and the bone metastases or not to decide on further therapy we would like to proceed with MRI scan of the lumbar spine MRI scan of lower thoracic spine and lumbar spine reveals multilevel degenerative disc disease and there is no evidence of metastases disease.   MRI scan has been loaded   To   Hospital system.  Has been reviewed independently.  SIEP and light chain is pending If it is negative then possibility of local therapy with either radiation or prostatectomy may be an appropriate approach. Radiation oncology's opinion is pending Patient may not be a candidate for neoadjuvant ADT because of low intermediate risk. No further appointment has been given. Duration of visit is 25 minutes and 50% of time was spent discussing VARIOUS  options and result of MRI.  And need for SIEP and light chain to be done to rule out any multiple myeloma Patient expressed understanding and was in agreement with this plan. He also understands that He can call clinic at any time with any questions, concerns, or complaints.    Adenocarcinoma of prostate [facility])   Staging form: Prostate, AJCC 7th Edition     Clinical: T2, N0, M0 - Unsigned   Forest Gleason, MD   08/29/2015 8:08 AM

## 2015-08-30 ENCOUNTER — Encounter: Payer: Self-pay | Admitting: Oncology

## 2015-09-05 ENCOUNTER — Encounter: Payer: Self-pay | Admitting: Internal Medicine

## 2015-09-06 ENCOUNTER — Ambulatory Visit
Admission: RE | Admit: 2015-09-06 | Discharge: 2015-09-06 | Disposition: A | Discharge status: Home / Self Care | Payer: Commercial Managed Care - HMO | Source: Ambulatory Visit | Attending: Radiation Oncology | Admitting: Radiation Oncology

## 2015-09-06 ENCOUNTER — Encounter: Payer: Self-pay | Admitting: Radiation Oncology

## 2015-09-06 VITALS — BP 134/85 | HR 89 | Temp 97.7°F | Wt 236.9 lb

## 2015-09-06 DIAGNOSIS — C61 Malignant neoplasm of prostate: Secondary | ICD-10-CM | POA: Insufficient documentation

## 2015-09-06 DIAGNOSIS — Z510 Encounter for antineoplastic radiation therapy: Secondary | ICD-10-CM | POA: Insufficient documentation

## 2015-09-06 NOTE — Progress Notes (Signed)
  Oncology Nurse Navigator Documentation    Navigator Encounter Type: Initial RadOnc (09/06/15 1100) Patient Visit Type: Radonc (09/06/15 1100)                    Time Spent with Patient: 15 (09/06/15 1100)   Met with Robert Powell and his spouse in Radiation Oncology exam room. He has received his MRI results and seen Dr Erlene Quan for follow up as well for surgery option. He is now here for Radiation option. He is aware that he can call me with any further questions or concerns.

## 2015-09-06 NOTE — Consult Note (Signed)
Except an outstanding is perfect of Radiation Oncology NEW PATIENT EVALUATION  Name: Robert Powell  MRN: [id number]  Date:   09/06/2015     DOB: 10/29/1950   This 64 y.o. male patient presents to the clinic for initial evaluation of prostate cancer stage IIa (T1 CN 0 M0) presenting with a PSA of 6.21 core of Gleason 7 (3+4) adenocarcinoma.  REFERRING PHYSICIAN: Glendon Axe, MD  CHIEF COMPLAINT:  Chief Complaint  Patient presents with  . Prostate Cancer    Initial consult    DIAGNOSIS: The encounter diagnosis was Malignant neoplasm of prostate ([place]).   PREVIOUS INVESTIGATIONS:  Pathology reports reviewed CT scan and bone scan as well as MRI scan reviewed Clinical notes reviewed  HPI: Patient is a 64 year old male with multiple comorbidities including narcolepsy alcohol abuse COPD and sleep apnea who presented with an elevated PSA of 6.2. Underwent 12 core transrectal ultrasound-guided biopsies showing mostly Gleason 6 adenocarcinoma (3+3). 1 core was Gleason 7 (3+4). Prostate was measured approximately 35 cc. He has CT scan showing lytic lesions at L2-3-4 although on MRI scan and bone scan this was thought to be degenerative changes. Patient does have mild left erectile dysfunction. He's been seen by urology is now referred to radiation oncology for opinion. He specifically denies significant lower urinary tract symptoms.  PLANNED TREATMENT REGIMEN: I-125 interstitial implant  PAST MEDICAL HISTORY:  has a past medical history of Elevated PSA; Hypertension; COPD (chronic obstructive pulmonary disease) ([place]); Hyperlipidemia; Sleep apnea; Narcolepsy; Alcohol abuse; Genital herpes; Tobacco abuse; Rhinitis; Cancer ([place]); Benign essential HTN (02/17/2015); Essential (primary) hypertension (02/18/2015); Chronic obstructive pulmonary disease ([facility]) (04/28/2014); BPH with obstruction/lower urinary tract symptoms (04/12/2015); Adenocarcinoma of prostate ([place]) (05/22/2015); AA (alcohol abuse)  (02/28/2014); Combined fat and carbohydrate induced hyperlipemia (02/28/2014); Apnea, sleep (02/28/2014); Cellulitis (09/21/2014); Elevated prostate specific antigen (PSA) (02/16/2015); and Lesion of external ear (04/28/2014).    PAST SURGICAL HISTORY:  Past Surgical History  Procedure Laterality Date  . Tonsillectomy      FAMILY HISTORY: family history includes Heart attack in his father and mother; Heart disease in his mother. There is no history of Kidney disease or Prostate cancer.  SOCIAL HISTORY:  reports that he has been smoking.  He does not have any smokeless tobacco history on file. He reports that he drinks alcohol. He reports that he does not use illicit drugs.  ALLERGIES: Review of patient's allergies indicates no known allergies.  MEDICATIONS:  Current Outpatient Prescriptions  Medication Sig Dispense Refill  . albuterol (VENTOLIN HFA) 108 (90 BASE) MCG/ACT inhaler Inhale into the lungs.    Marland Kitchen amLODipine (NORVASC) 5 MG tablet Take by mouth.    Marland Kitchen ipratropium-albuterol (DUONEB) 0.5-2.5 (3) MG/3ML SOLN Inhale into the lungs.    Marland Kitchen ipratropium-albuterol (DUONEB) 0.5-2.5 (3) MG/3ML SOLN     . lisinopril-hydrochlorothiazide (PRINZIDE,ZESTORETIC) 20-12.5 MG per tablet Take by mouth.    . tiotropium (SPIRIVA) 18 MCG inhalation capsule Place into inhaler and inhale.     No current facility-administered medications for this encounter.    ECOG PERFORMANCE STATUS:  0 - Asymptomatic  REVIEW OF SYSTEMS:  Patient denies any weight loss, fatigue, weakness, fever, chills or night sweats. Patient denies any loss of vision, blurred vision. Patient denies any ringing  of the ears or hearing loss. No irregular heartbeat. Patient denies heart murmur or history of fainting. Patient denies any chest pain or pain radiating to her upper extremities. Patient denies any shortness of breath, difficulty breathing at night, cough or hemoptysis. Patient  denies any swelling in the lower legs. Patient denies any  nausea vomiting, vomiting of blood, or coffee ground material in the vomitus. Patient denies any stomach pain. Patient states has had normal bowel movements no significant constipation or diarrhea. Patient denies any dysuria, hematuria or significant nocturia. Patient denies any problems walking, swelling in the joints or loss of balance. Patient denies any skin changes, loss of hair or loss of weight. Patient denies any excessive worrying or anxiety or significant depression. Patient denies any problems with insomnia. Patient denies excessive thirst, polyuria, polydipsia. Patient denies any swollen glands, patient denies easy bruising or easy bleeding. Patient denies any recent infections, allergies or URI. Patient "s visual fields have not changed significantly in recent time.    PHYSICAL EXAM: BP 134/85 mmHg  Pulse 89  Temp(Src) 97.7 F (36.5 C)  Wt 236 lb 14.2 oz (107.45 kg) Well-developed obese male with extreme narcolepsy snoozing during some of the interview. Well-developed well-nourished patient in NAD. HEENT reveals PERLA, EOMI, discs not visualized.  Oral cavity is clear. No oral mucosal lesions are identified. Neck is clear without evidence of cervical or supraclavicular adenopathy. Lungs are clear to A&P. Cardiac examination is essentially unremarkable with regular rate and rhythm without murmur rub or thrill. Abdomen is benign with no organomegaly or masses noted. Motor sensory and DTR levels are equal and symmetric in the upper and lower extremities. Cranial nerves II through XII are grossly intact. Proprioception is intact. No peripheral adenopathy or edema is identified. No motor or sensory levels are noted. Crude visual fields are within normal range.  LABORATORY DATA: Pathology report reviewed    RADIOLOGY RESULTS: Bone scan CT scans and MRI scans of his spine all reviewed compatible above-stated findings.   IMPRESSION: Stage IIa adenocarcinoma the prostate in patient with  significant narcolepsy multiple other comorbidities.  PLAN: At this time I got over treatment recommendations with the patient. Also discussed the case personally with urology. Based on significant medical comorbidities do not feel radical prostatectomy would be best avenue of treatment. Patient is in favor of radiation treatments and I believe with his narcolepsy daily treatments for 8 weeks would be extremely difficult. I would favor I-125 interstitial implant. Risks and benefits of treatment including increased lower urinary tract symptoms, possible diarrhea, fatigue, alteration of blood counts and radiation safety precautions during treatments were all reviewed in detail with the patient. We will set up a volume study followed by I-125 interstitial implant.  I would like to take this opportunity for allowing me to participate in the care of your patient.Armstead Peaks., MD

## 2015-09-14 ENCOUNTER — Telehealth: Payer: Self-pay | Admitting: Radiology

## 2015-09-14 NOTE — Telephone Encounter (Signed)
Notified pt of surgery scheduled 10/03/15 & 10/31/15, pre-admit testing appt on 10/20/15 @12 :30 & to call day prior to surgery for arrival time to SDS. Pt voices understanding.

## 2015-09-18 ENCOUNTER — Other Ambulatory Visit: Payer: Self-pay | Admitting: Oncology

## 2015-09-18 ENCOUNTER — Inpatient Hospital Stay
Admission: RE | Admit: 2015-09-18 | Discharge: 2015-09-18 | Disposition: A | Discharge status: Home / Self Care | Payer: Self-pay | Source: Ambulatory Visit | Attending: Oncology | Admitting: Oncology

## 2015-09-18 DIAGNOSIS — C61 Malignant neoplasm of prostate: Secondary | ICD-10-CM

## 2015-09-26 DIAGNOSIS — F1021 Alcohol dependence, in remission: Secondary | ICD-10-CM | POA: Insufficient documentation

## 2015-09-26 DIAGNOSIS — F1020 Alcohol dependence, uncomplicated: Secondary | ICD-10-CM | POA: Insufficient documentation

## 2015-09-26 DIAGNOSIS — I10 Essential (primary) hypertension: Secondary | ICD-10-CM | POA: Diagnosis not present

## 2015-09-26 DIAGNOSIS — J449 Chronic obstructive pulmonary disease, unspecified: Secondary | ICD-10-CM | POA: Diagnosis not present

## 2015-10-02 DIAGNOSIS — R0602 Shortness of breath: Secondary | ICD-10-CM | POA: Diagnosis not present

## 2015-10-02 DIAGNOSIS — Z720 Tobacco use: Secondary | ICD-10-CM | POA: Diagnosis not present

## 2015-10-02 DIAGNOSIS — I10 Essential (primary) hypertension: Secondary | ICD-10-CM | POA: Diagnosis not present

## 2015-10-02 DIAGNOSIS — E782 Mixed hyperlipidemia: Secondary | ICD-10-CM | POA: Diagnosis not present

## 2015-10-03 ENCOUNTER — Encounter: Payer: Self-pay | Admitting: Radiation Oncology

## 2015-10-03 ENCOUNTER — Ambulatory Visit
Admission: RE | Admit: 2015-10-03 | Discharge: 2015-10-03 | Disposition: A | Discharge status: Home / Self Care | Payer: Commercial Managed Care - HMO | Source: Ambulatory Visit | Attending: Radiation Oncology | Admitting: Radiation Oncology

## 2015-10-03 ENCOUNTER — Ambulatory Visit: Admission: RE | Admit: 2015-10-03 | Payer: Commercial Managed Care - HMO | Source: Ambulatory Visit | Admitting: Urology

## 2015-10-03 VITALS — BP 156/96 | HR 87 | Temp 97.5°F | Resp 18 | Wt 237.9 lb

## 2015-10-03 DIAGNOSIS — C61 Malignant neoplasm of prostate: Secondary | ICD-10-CM | POA: Diagnosis not present

## 2015-10-03 DIAGNOSIS — Z510 Encounter for antineoplastic radiation therapy: Secondary | ICD-10-CM | POA: Diagnosis not present

## 2015-10-03 SURGERY — ULTRASOUND, PROSTATE, FOR VOLUME DETERMINATION
Anesthesia: Choice

## 2015-10-03 NOTE — H&P (Signed)
Except an outstanding is perfect of Radiation Oncology NEW PATIENT EVALUATION  Name: Robert Powell  MRN: [id number]  Date:   10/03/2015     DOB: [birth year]   This 65 y.o. male patient presents to the clinic for history and physical prior to I-125 interstitial implant.  REFERRING PHYSICIAN: Glendon Axe, MD  CHIEF COMPLAINT: No chief complaint on file.   DIAGNOSIS: There were no encounter diagnoses.   PREVIOUS INVESTIGATIONS:  Clinical notes reviewed  HPI: Patient is a 65 year old male with multiple comorbidities including narcolepsy alcohol abuse COPD and sleep apnea who presented with an elevated PSA of 6.2. Underwent 12 core transrectal ultrasound-guided biopsies showing mostly Gleason 6 adenocarcinoma (3+3). 1 core was Gleason 7 (3+4). Prostate was measured approximately 35 cc. He has CT scan showing lytic lesions at L2-3-4 although on MRI scan and bone scan this was thought to be degenerative changes. Patient does have mild left erectile dysfunction. Patient was recommended I-125 interstitial implant and he is seen today for volume study and treatment planning for that procedure.  PLANNED TREATMENT REGIMEN: I-125 interstitial implant  PAST MEDICAL HISTORY:  has a past medical history of Elevated PSA; Hypertension; COPD (chronic obstructive pulmonary disease) ([place]); Hyperlipidemia; Sleep apnea; Narcolepsy; Alcohol abuse; Genital herpes; Tobacco abuse; Rhinitis; Cancer ([place]); Benign essential HTN (02/17/2015); Essential (primary) hypertension (02/18/2015); Chronic obstructive pulmonary disease (HCC) (04/28/2014); BPH with obstruction/lower urinary tract symptoms (04/12/2015); Adenocarcinoma of prostate ([place]) (05/22/2015); AA (alcohol abuse) (02/28/2014); Combined fat and carbohydrate induced hyperlipemia (02/28/2014); Apnea, sleep (02/28/2014); Cellulitis (09/21/2014); Elevated prostate specific antigen (PSA) (02/16/2015); and Lesion of external ear (04/28/2014).    PAST SURGICAL HISTORY:  Past  Surgical History  Procedure Laterality Date  . Tonsillectomy      FAMILY HISTORY: family history includes Heart attack in his father and mother; Heart disease in his mother. There is no history of Kidney disease or Prostate cancer.  SOCIAL HISTORY:  reports that he has been smoking.  He does not have any smokeless tobacco history on file. He reports that he drinks alcohol. He reports that he does not use illicit drugs.  ALLERGIES: Review of patient's allergies indicates no known allergies.  MEDICATIONS:  Current Outpatient Prescriptions  Medication Sig Dispense Refill  . albuterol (VENTOLIN HFA) 108 (90 BASE) MCG/ACT inhaler Inhale into the lungs.    Marland Kitchen amLODipine (NORVASC) 5 MG tablet Take by mouth.    Marland Kitchen ipratropium-albuterol (DUONEB) 0.5-2.5 (3) MG/3ML SOLN Inhale into the lungs.    Marland Kitchen ipratropium-albuterol (DUONEB) 0.5-2.5 (3) MG/3ML SOLN     . lisinopril-hydrochlorothiazide (PRINZIDE,ZESTORETIC) 20-12.5 MG per tablet Take by mouth.    . tiotropium (SPIRIVA) 18 MCG inhalation capsule Place into inhaler and inhale.     No current facility-administered medications for this encounter.    ECOG PERFORMANCE STATUS:  0 - Asymptomatic  REVIEW OF SYSTEMS: Except for narcolepsy and history of alcohol use Patient denies any weight loss, fatigue, weakness, fever, chills or night sweats. Patient denies any loss of vision, blurred vision. Patient denies any ringing  of the ears or hearing loss. No irregular heartbeat. Patient denies heart murmur or history of fainting. Patient denies any chest pain or pain radiating to her upper extremities. Patient denies any shortness of breath, difficulty breathing at night, cough or hemoptysis. Patient denies any swelling in the lower legs. Patient denies any nausea vomiting, vomiting of blood, or coffee ground material in the vomitus. Patient denies any stomach pain. Patient states has had normal bowel movements no significant constipation or diarrhea.  Patient  denies any dysuria, hematuria or significant nocturia. Patient denies any problems walking, swelling in the joints or loss of balance. Patient denies any skin changes, loss of hair or loss of weight. Patient denies any excessive worrying or anxiety or significant depression. Patient denies any problems with insomnia. Patient denies excessive thirst, polyuria, polydipsia. Patient denies any swollen glands, patient denies easy bruising or easy bleeding. Patient denies any recent infections, allergies or URI. Patient "s visual fields have not changed significantly in recent time.    PHYSICAL EXAM: There were no vitals taken for this visit. Well-developed well-nourished patient in NAD. HEENT reveals PERLA, EOMI, discs not visualized.  Oral cavity is clear. No oral mucosal lesions are identified. Neck is clear without evidence of cervical or supraclavicular adenopathy. Lungs are clear to A&P. Cardiac examination is essentially unremarkable with regular rate and rhythm without murmur rub or thrill. Abdomen is benign with no organomegaly or masses noted. Motor sensory and DTR levels are equal and symmetric in the upper and lower extremities. Cranial nerves II through XII are grossly intact. Proprioception is intact. No peripheral adenopathy or edema is identified. No motor or sensory levels are noted. Crude visual fields are within normal range.  LABORATORY DATA: Pathology reports reviewed    RADIOLOGY RESULTS: No films for review   IMPRESSION: Stage IIa adenocarcinoma the prostate for I-125 interstitial implant and volume study today  PLAN: Patient has undergone volume study today in preparation for treatment planning for I-125 interstitial implant or tenderness to deliver 145 gray to his prostatic tumor volume. Patient will undergo BrachyVision treatment planning. Risks and benefits of treatment were reviewed with the patient today and consent was signed. We'll proceed as outlined for his prostate  implant.  I would like to take this opportunity for allowing me to participate in the care of your patient.Armstead Peaks., MD

## 2015-10-03 NOTE — Progress Notes (Signed)
Radiation Oncology Follow up Note  Name: Robert Powell   Date:   09/13/2015 MRN:  [id number] DOB: [birth year]    This 64 y.o. male presents to the clinic today for patient.Patient is a 65 year old male with multiple comorbidities including narcolepsy alcohol abuse COPD and sleep apnea who presented with an elevated PSA of 6.2. Underwent 12 core transrectal ultrasound-guided biopsies showing mostly Gleason 6 adenocarcinoma (3+3). 1 core was Gleason 7 (3+4). Prostate was measured approximately 35 cc. He has CT scan showing lytic lesions at L2-3-4 although on MRI scan and bone scan this was thought to be degenerative changes. Patient does have mild left erectile dysfunction. Patient was recommended I-125 interstitial implant and he is seen today for volume study and treatment planning for that procedure.  REFERRING PROVIDER: No ref. provider found  HPI: As above.  COMPLICATIONS OF TREATMENT: none  FOLLOW UP COMPLIANCE: keeps appointments   PHYSICAL EXAM:  There were no vitals taken for this visit. Well-developed well-nourished patient in NAD. HEENT reveals PERLA, EOMI, discs not visualized.  Oral cavity is clear. No oral mucosal lesions are identified. Neck is clear without evidence of cervical or supraclavicular adenopathy. Lungs are clear to A&P. Cardiac examination is essentially unremarkable with regular rate and rhythm without murmur rub or thrill. Abdomen is benign with no organomegaly or masses noted. Motor sensory and DTR levels are equal and symmetric in the upper and lower extremities. Cranial nerves II through XII are grossly intact. Proprioception is intact. No peripheral adenopathy or edema is identified. No motor or sensory levels are noted. Crude visual fields are within normal range.  RADIOLOGY RESULTS: No films for review  PLAN: Patient was taken to the cystoscopy suite in the OR. Patient was placed in the low lithotomy position. Foley catheter was placed. Trans-rectal ultrasound  probe was inserted into the rectum and prostate seminal vesicles were visualized as well as bladder base. stepping images were performed on a 5 mm increments. Images will be placed in BrachyVision treatment planning system to determine seed placement coordinates for eventual I-125 interstitial implant. Images will be reviewed with the physics and dosimetry staff for final quality approval. I personally was present for the volume study and assisted in delineation of contour volumes.  At the end of the procedure Foley catheter was removed, rectal ultrasound probe was removed. Patient tolerated his procedures extremely well with no side effects or complaints. Patient has given appointment for interstitial implant date. Consent was signed today as well as history and physical performed in preparation for his outpatient surgical implant.      Armstead Peaks., MD

## 2015-10-09 DIAGNOSIS — Z510 Encounter for antineoplastic radiation therapy: Secondary | ICD-10-CM | POA: Diagnosis not present

## 2015-10-09 DIAGNOSIS — C61 Malignant neoplasm of prostate: Secondary | ICD-10-CM | POA: Diagnosis not present

## 2015-10-20 ENCOUNTER — Encounter
Admission: RE | Admit: 2015-10-20 | Discharge: 2015-10-20 | Disposition: A | Discharge status: Home / Self Care | Payer: Commercial Managed Care - HMO | Source: Ambulatory Visit | Attending: Urology | Admitting: Urology

## 2015-10-20 DIAGNOSIS — Z01812 Encounter for preprocedural laboratory examination: Secondary | ICD-10-CM | POA: Insufficient documentation

## 2015-10-20 LAB — CBC
HCT: 43.3 % (ref 40.0–52.0)
HEMOGLOBIN: 14.8 g/dL (ref 13.0–18.0)
MCH: 30.9 pg (ref 26.0–34.0)
MCHC: 34 g/dL (ref 32.0–36.0)
MCV: 90.9 fL (ref 80.0–100.0)
Platelets: 157 10*3/uL (ref 150–440)
RBC: 4.77 MIL/uL (ref 4.40–5.90)
RDW: 13 % (ref 11.5–14.5)
WBC: 5.8 10*3/uL (ref 3.8–10.6)

## 2015-10-20 NOTE — Patient Instructions (Signed)
  Your procedure is scheduled on: 10/31/15 Tues Report to Day Surgery.2nd floor medical mall To find out your arrival time please call [phone] between 1PM - 3PM on 10/30/15 Mon.  Remember: Instructions that are not followed completely may result in serious medical risk, up to and including death, or upon the discretion of your surgeon and anesthesiologist your surgery may need to be rescheduled.    __x__ 1. Do not eat food or drink liquids after midnight. No gum chewing or hard candies.     __x__ 2. No Alcohol for 24 hours before or after surgery.   ____ 3. Bring all medications with you on the day of surgery if instructed.    __x__ 4. Notify your doctor if there is any change in your medical condition     (cold, fever, infections).     Do not wear jewelry, make-up, hairpins, clips or nail polish.  Do not wear lotions, powders, or perfumes. You may wear deodorant.  Do not shave 48 hours prior to surgery. Men may shave face and neck.  Do not bring valuables to the hospital.    [facility] is not responsible for any belongings or valuables.               Contacts, dentures or bridgework may not be worn into surgery.  Leave your suitcase in the car. After surgery it may be brought to your room.  For patients admitted to the hospital, discharge time is determined by your                treatment team.   Patients discharged the day of surgery will not be allowed to drive home.   Please read over the following fact sheets that you were given:      _x___ Take these medicines the morning of surgery with A SIP OF WATER:    1. amLODipine (NORVASC) 5 MG tablet  2. tiotropium (SPIRIVA) 18 MCG inhalation capsule  3.   4.  5.  6.  ____ Fleet Enema (as directed)   ____ Use CHG Soap as directed  _x___ Use inhalers on the day of surgery and  bring albuterol to hospital  ____ Stop metformin 2 days prior to surgery    ____ Take 1/2 of usual insulin dose the night before surgery and  none on the morning of surgery.   ____ Stop Coumadin/Plavix/aspirin on   _x___ Stop Anti-inflammatories on 1 week before surgery, ibuprofen and aspirin.  May use Tylenol as needed   ____ Stop supplements until after surgery.    ____ Bring C-Pap to the hospital.

## 2015-10-20 NOTE — Pre-Procedure Instructions (Signed)
Patient notified regarding using Fleets enema 2 hours prior to surgery.

## 2015-10-25 ENCOUNTER — Telehealth: Payer: Self-pay | Admitting: Radiology

## 2015-10-25 NOTE — Telephone Encounter (Signed)
Notified pt of surgery r/s to 11/20/15 & to call Friday prior to surgery for arrival time to SDS. Pt voices understanding.

## 2015-11-20 ENCOUNTER — Ambulatory Visit: Payer: Commercial Managed Care - HMO | Admitting: Certified Registered"

## 2015-11-20 ENCOUNTER — Encounter
Admission: RE | Disposition: A | Discharge status: Home / Self Care | Payer: Self-pay | Source: Ambulatory Visit | Attending: Urology

## 2015-11-20 ENCOUNTER — Ambulatory Visit
Admission: RE | Admit: 2015-11-20 | Discharge: 2015-11-20 | Disposition: A | Discharge status: Home / Self Care | Payer: Commercial Managed Care - HMO | Source: Ambulatory Visit | Attending: Urology | Admitting: Urology

## 2015-11-20 ENCOUNTER — Encounter: Payer: Self-pay | Admitting: *Deleted

## 2015-11-20 ENCOUNTER — Encounter: Payer: Self-pay | Admitting: Radiation Oncology

## 2015-11-20 ENCOUNTER — Other Ambulatory Visit: Payer: Self-pay | Admitting: Radiation Oncology

## 2015-11-20 DIAGNOSIS — Z8249 Family history of ischemic heart disease and other diseases of the circulatory system: Secondary | ICD-10-CM | POA: Diagnosis not present

## 2015-11-20 DIAGNOSIS — F17200 Nicotine dependence, unspecified, uncomplicated: Secondary | ICD-10-CM | POA: Insufficient documentation

## 2015-11-20 DIAGNOSIS — G47419 Narcolepsy without cataplexy: Secondary | ICD-10-CM | POA: Diagnosis not present

## 2015-11-20 DIAGNOSIS — E7801 Familial hypercholesterolemia: Secondary | ICD-10-CM | POA: Insufficient documentation

## 2015-11-20 DIAGNOSIS — F1010 Alcohol abuse, uncomplicated: Secondary | ICD-10-CM | POA: Diagnosis not present

## 2015-11-20 DIAGNOSIS — G4730 Sleep apnea, unspecified: Secondary | ICD-10-CM | POA: Diagnosis not present

## 2015-11-20 DIAGNOSIS — J449 Chronic obstructive pulmonary disease, unspecified: Secondary | ICD-10-CM | POA: Diagnosis not present

## 2015-11-20 DIAGNOSIS — E785 Hyperlipidemia, unspecified: Secondary | ICD-10-CM | POA: Insufficient documentation

## 2015-11-20 DIAGNOSIS — I10 Essential (primary) hypertension: Secondary | ICD-10-CM | POA: Insufficient documentation

## 2015-11-20 DIAGNOSIS — C61 Malignant neoplasm of prostate: Secondary | ICD-10-CM | POA: Insufficient documentation

## 2015-11-20 HISTORY — PX: RADIOACTIVE SEED IMPLANT: SHX5150

## 2015-11-20 SURGERY — INSERTION, RADIATION SOURCE, PROSTATE
Anesthesia: General | Wound class: Clean Contaminated

## 2015-11-20 MED ORDER — FAMOTIDINE 20 MG PO TABS
20.0000 mg | ORAL_TABLET | Freq: Once | ORAL | Status: AC
  Administered 2015-11-20: 20 mg via ORAL

## 2015-11-20 MED ORDER — CIPROFLOXACIN HCL 500 MG PO TABS: 500.0000 mg | ORAL_TABLET | Freq: Two times a day (BID) | ORAL | Status: DC

## 2015-11-20 MED ORDER — HYDROCODONE-ACETAMINOPHEN 5-325 MG PO TABS: 1.0000 | ORAL_TABLET | Freq: Four times a day (QID) | ORAL | Status: DC | PRN

## 2015-11-20 MED ORDER — TAMSULOSIN HCL 0.4 MG PO CAPS: 0.4000 mg | ORAL_CAPSULE | Freq: Every day | ORAL | Status: DC

## 2015-11-20 MED ORDER — DOCUSATE SODIUM 100 MG PO CAPS: 100.0000 mg | ORAL_CAPSULE | Freq: Two times a day (BID) | ORAL | Status: DC

## 2015-11-20 MED ORDER — CEFAZOLIN SODIUM 1-5 GM-% IV SOLN
1.0000 g | Freq: Once | INTRAVENOUS | Status: AC
  Administered 2015-11-20: 1 g via INTRAVENOUS

## 2015-11-20 MED ORDER — PROPOFOL 10 MG/ML IV BOLUS
INTRAVENOUS | Status: DC | PRN
  Administered 2015-11-20: 50 mg via INTRAVENOUS
  Administered 2015-11-20: 40 mg via INTRAVENOUS
  Administered 2015-11-20: 200 mg via INTRAVENOUS

## 2015-11-20 MED ORDER — ONDANSETRON HCL 4 MG/2ML IJ SOLN: 4.0000 mg | Freq: Once | INTRAMUSCULAR | Status: DC | PRN

## 2015-11-20 MED ORDER — FENTANYL CITRATE (PF) 100 MCG/2ML IJ SOLN: 25.0000 ug | INTRAMUSCULAR | Status: DC | PRN

## 2015-11-20 MED ORDER — FAMOTIDINE 20 MG PO TABS
ORAL_TABLET | ORAL | Status: AC
  Administered 2015-11-20: 20 mg via ORAL
  Filled 2015-11-20: qty 1

## 2015-11-20 MED ORDER — LIDOCAINE HCL (CARDIAC) 20 MG/ML IV SOLN
INTRAVENOUS | Status: DC | PRN
  Administered 2015-11-20: 100 mg via INTRAVENOUS

## 2015-11-20 MED ORDER — ONDANSETRON HCL 4 MG/2ML IJ SOLN
INTRAMUSCULAR | Status: DC | PRN
  Administered 2015-11-20: 4 mg via INTRAVENOUS

## 2015-11-20 MED ORDER — BACITRACIN ZINC 500 UNIT/GM EX OINT
TOPICAL_OINTMENT | CUTANEOUS | Status: AC
  Filled 2015-11-20: qty 28.35

## 2015-11-20 MED ORDER — LACTATED RINGERS IV SOLN
INTRAVENOUS | Status: DC
  Administered 2015-11-20: 07:00:00 via INTRAVENOUS

## 2015-11-20 MED ORDER — CEFAZOLIN SODIUM 1-5 GM-% IV SOLN
INTRAVENOUS | Status: AC
Start: 2015-11-20 — End: 2015-11-20
  Administered 2015-11-20: 1 g via INTRAVENOUS
  Filled 2015-11-20: qty 50

## 2015-11-20 MED ORDER — FENTANYL CITRATE (PF) 100 MCG/2ML IJ SOLN
INTRAMUSCULAR | Status: DC | PRN
  Administered 2015-11-20 (×3): 25 ug via INTRAVENOUS
  Administered 2015-11-20 (×2): 50 ug via INTRAVENOUS

## 2015-11-20 MED ORDER — PHENYLEPHRINE HCL 10 MG/ML IJ SOLN
INTRAMUSCULAR | Status: DC | PRN
  Administered 2015-11-20 (×4): 100 ug via INTRAVENOUS

## 2015-11-20 MED ORDER — MIDAZOLAM HCL 2 MG/2ML IJ SOLN
INTRAMUSCULAR | Status: DC | PRN
  Administered 2015-11-20: 2 mg via INTRAVENOUS

## 2015-11-20 SURGICAL SUPPLY — 30 items
BAG URO DRAIN 2000ML W/SPOUT (MISCELLANEOUS) ×3 IMPLANT
BLADE CLIPPER SURG (BLADE) ×6 IMPLANT
CATH FOL 2WAY LX 16X5 (CATHETERS) IMPLANT
CATH ROBINSON RED A/P 16FR (CATHETERS) ×3 IMPLANT
DRAPE INCISE 23X17 IOBAN STRL (DRAPES) ×2
DRAPE INCISE IOBAN 23X17 STRL (DRAPES) ×1 IMPLANT
DRAPE TABLE BACK 80X90 (DRAPES) ×3 IMPLANT
DRAPE UNDER BUTTOCK W/FLU (DRAPES) ×3 IMPLANT
DRSG TELFA 3X8 NADH (GAUZE/BANDAGES/DRESSINGS) ×3 IMPLANT
GLOVE BIO SURGEON STRL SZ 6.5 (GLOVE) ×4 IMPLANT
GLOVE BIO SURGEON STRL SZ7 (GLOVE) ×6 IMPLANT
GLOVE BIO SURGEON STRL SZ7.5 (GLOVE) ×6 IMPLANT
GLOVE BIO SURGEONS STRL SZ 6.5 (GLOVE) ×2
GOWN STRL REUS W/ TWL LRG LVL3 (GOWN DISPOSABLE) ×2 IMPLANT
GOWN STRL REUS W/ TWL XL LVL3 (GOWN DISPOSABLE) ×1 IMPLANT
GOWN STRL REUS W/TWL LRG LVL3 (GOWN DISPOSABLE) ×4
GOWN STRL REUS W/TWL XL LVL3 (GOWN DISPOSABLE) ×2
GRADUATE 1200CC STRL 31836 (MISCELLANEOUS) ×3 IMPLANT
IV NS 1000ML (IV SOLUTION) ×2
IV NS 1000ML BAXH (IV SOLUTION) ×1 IMPLANT
KIT RM TURNOVER CYSTO AR (KITS) ×3 IMPLANT
PACK CYSTO AR (MISCELLANEOUS) ×3 IMPLANT
PAD PREP 24X41 OB/GYN DISP (PERSONAL CARE ITEMS) ×3 IMPLANT
SET CYSTO W/LG BORE CLAMP LF (SET/KITS/TRAYS/PACK) ×3 IMPLANT
SOL PREP PVP 2OZ (MISCELLANEOUS) ×3
SOLUTION PREP PVP 2OZ (MISCELLANEOUS) ×1 IMPLANT
SURGILUBE 2OZ TUBE FLIPTOP (MISCELLANEOUS) ×3 IMPLANT
SYRINGE IRR TOOMEY STRL 70CC (SYRINGE) ×3 IMPLANT
WATER STERILE IRR 1000ML POUR (IV SOLUTION) ×3 IMPLANT
WATER STERILE IRR 3000ML UROMA (IV SOLUTION) ×3 IMPLANT

## 2015-11-20 NOTE — H&P (Signed)
Name: Robert Powell  MRN: [id number]  Date: 10/03/2015  DOB: 04-Jun-1951  This 65 y.o. male patient presents to the clinic for history and physical prior to I-125 interstitial implant.  REFERRING PHYSICIAN: Glendon Axe, MD  CHIEF COMPLAINT: No chief complaint on file.   DIAGNOSIS: There were no encounter diagnoses.  PREVIOUS INVESTIGATIONS:  Clinical notes reviewed  HPI: Patient is a 65 year old male with multiple comorbidities including narcolepsy alcohol abuse COPD and sleep apnea who presented with an elevated PSA of 6.2. Underwent 12 core transrectal ultrasound-guided biopsies showing mostly Gleason 6 adenocarcinoma (3+3). 1 core was Gleason 7 (3+4). Prostate was measured approximately 35 cc. He has CT scan showing lytic lesions at L2-3-4 although on MRI scan and bone scan this was thought to be degenerative changes. Patient does have mild left erectile dysfunction. Patient was recommended I-125 interstitial implant and he is seen today for volume study and treatment planning for that procedure.  PLANNED TREATMENT REGIMEN: I-125 interstitial implant  PAST MEDICAL HISTORY: has a past medical history of Elevated PSA; Hypertension; COPD (chronic obstructive pulmonary disease) ([place]); Hyperlipidemia; Sleep apnea; Narcolepsy; Alcohol abuse; Genital herpes; Tobacco abuse; Rhinitis; Cancer ([place]); Benign essential HTN (02/17/2015); Essential (primary) hypertension (02/18/2015); Chronic obstructive pulmonary disease (HCC) (04/28/2014); BPH with obstruction/lower urinary tract symptoms (04/12/2015); Adenocarcinoma of prostate ([place]) (05/22/2015); AA (alcohol abuse) (02/28/2014); Combined fat and carbohydrate induced hyperlipemia (02/28/2014); Apnea, sleep (02/28/2014); Cellulitis (09/21/2014); Elevated prostate specific antigen (PSA) (02/16/2015); and Lesion of external ear (04/28/2014).  PAST SURGICAL HISTORY:  Past Surgical History   Procedure  Laterality  Date   .  Tonsillectomy      FAMILY HISTORY: family history  includes Heart attack in his father and mother; Heart disease in his mother. There is no history of Kidney disease or Prostate cancer.  SOCIAL HISTORY: reports that he has been smoking. He does not have any smokeless tobacco history on file. He reports that he drinks alcohol. He reports that he does not use illicit drugs.  ALLERGIES: Review of patient's allergies indicates no known allergies.  MEDICATIONS:  Current Outpatient Prescriptions   Medication  Sig  Dispense  Refill   .  albuterol (VENTOLIN HFA) 108 (90 BASE) MCG/ACT inhaler  Inhale into the lungs.     Marland Kitchen  amLODipine (NORVASC) 5 MG tablet  Take by mouth.     Marland Kitchen  ipratropium-albuterol (DUONEB) 0.5-2.5 (3) MG/3ML SOLN  Inhale into the lungs.     Marland Kitchen  ipratropium-albuterol (DUONEB) 0.5-2.5 (3) MG/3ML SOLN      .  lisinopril-hydrochlorothiazide (PRINZIDE,ZESTORETIC) 20-12.5 MG per tablet  Take by mouth.     .  tiotropium (SPIRIVA) 18 MCG inhalation capsule  Place into inhaler and inhale.      No current facility-administered medications for this encounter.    ECOG PERFORMANCE STATUS: 0 - Asymptomatic  REVIEW OF SYSTEMS: Except for narcolepsy and history of alcohol use  Patient denies any weight loss, fatigue, weakness, fever, chills or night sweats. Patient denies any loss of vision, blurred vision. Patient denies any ringing of the ears or hearing loss. No irregular heartbeat. Patient denies heart murmur or history of fainting. Patient denies any chest pain or pain radiating to her upper extremities. Patient denies any shortness of breath, difficulty breathing at night, cough or hemoptysis. Patient denies any swelling in the lower legs. Patient denies any nausea vomiting, vomiting of blood, or coffee ground material in the vomitus. Patient denies any stomach pain. Patient states has had normal bowel movements no significant constipation  or diarrhea. Patient denies any dysuria, hematuria or significant nocturia. Patient denies any problems walking,  swelling in the joints or loss of balance. Patient denies any skin changes, loss of hair or loss of weight. Patient denies any excessive worrying or anxiety or significant depression. Patient denies any problems with insomnia. Patient denies excessive thirst, polyuria, polydipsia. Patient denies any swollen glands, patient denies easy bruising or easy bleeding. Patient denies any recent infections, allergies or URI. Patient "s visual fields have not changed significantly in recent time.  PHYSICAL EXAM:  There were no vitals taken for this visit.  Well-developed well-nourished patient in NAD. HEENT reveals PERLA, EOMI, discs not visualized. Oral cavity is clear. No oral mucosal lesions are identified. Neck is clear without evidence of cervical or supraclavicular adenopathy. Lungs are clear to A&P. Cardiac examination is essentially unremarkable with regular rate and rhythm without murmur rub or thrill. Abdomen is benign with no organomegaly or masses noted. Motor sensory and DTR levels are equal and symmetric in the upper and lower extremities. Cranial nerves II through XII are grossly intact. Proprioception is intact. No peripheral adenopathy or edema is identified. No motor or sensory levels are noted. Crude visual fields are within normal range.  LABORATORY DATA: Pathology reports reviewed  RADIOLOGY RESULTS: No films for review  IMPRESSION: Stage IIa adenocarcinoma the prostate for I-125 interstitial implant and volume study today  PLAN: Patient has undergone volume study today in preparation for treatment planning for I-125 interstitial implant or tenderness to deliver 145 gray to his prostatic tumor volume. Patient will undergo BrachyVision treatment planning. Risks and benefits of treatment were reviewed with the patient today and consent was signed. We'll proceed as outlined for his prostate implant.  I would like to take this opportunity for allowing me to participate in the care of your patient.Armstead Peaks., MD

## 2015-11-20 NOTE — OR Nursing (Signed)
Bladder scanner measured 76ml.

## 2015-11-20 NOTE — Transfer of Care (Signed)
Immediate Anesthesia Transfer of Care Note  Patient: Marland Kitchen  Procedure(s) Performed: Procedure(s): RADIOACTIVE SEED IMPLANT/BRACHYTHERAPY IMPLANT (N/A)  Patient Location: PACU  Anesthesia Type:General  Level of Consciousness: confused and responds to stimulation  Airway & Oxygen Therapy: Patient Spontanous Breathing and Patient connected to face mask oxygen  Post-op Assessment: Report given to RN and Post -op Vital signs reviewed and stable  Post vital signs: Reviewed and stable  Last Vitals:  Filed Vitals:   11/20/15 0625 11/20/15 0847  BP: 123/78 96/67  Pulse: 76 81  Temp: 36.4 C 36.8 C  Resp: 18 24    Complications: No apparent anesthesia complications

## 2015-11-20 NOTE — Progress Notes (Signed)
Patient has history of narcolepsy, when RN called for patient to come back to pre-op patient asleep and unable to awoke by Network engineer. RN assessed patient in waiting room. Difficult to arouse and would only respond briefly and go back to sleep. Family verified this was normal for him. Stretcher brought to patient and with much assistance patient was placed into sitting position on side of stretcher then laid back onto stretcher, once on stretcher patient awoke more and brought back to pre-op and prepared for surgery.

## 2015-11-20 NOTE — Anesthesia Preprocedure Evaluation (Addendum)
Anesthesia Evaluation  Patient identified by MRN, date of birth, ID band Patient awake    Reviewed: Allergy & Precautions, H&P , NPO status , Patient's Chart, lab work & pertinent test results, reviewed documented beta blocker date and time   History of Anesthesia Complications Negative for: history of anesthetic complications  Airway Mallampati: III  TM Distance: >3 FB Neck ROM: full    Dental no notable dental hx. (+) Missing, Poor Dentition   Pulmonary shortness of breath and with exertion, sleep apnea , COPD,  COPD inhaler, neg recent URI, Current Smoker,    Pulmonary exam normal breath sounds clear to auscultation       Cardiovascular Exercise Tolerance: Good hypertension, (-) angina(-) CAD, (-) Past MI, (-) Cardiac Stents and (-) CABG Normal cardiovascular exam(-) dysrhythmias (-) Valvular Problems/Murmurs Rhythm:regular Rate:Normal     Neuro/Psych Narcolepsy negative psych ROS   GI/Hepatic negative GI ROS, (+)     substance abuse  alcohol use,   Endo/Other  negative endocrine ROS  Renal/GU negative Renal ROS  negative genitourinary   Musculoskeletal   Abdominal   Peds  Hematology negative hematology ROS (+)   Anesthesia Other Findings Past Medical History:   Elevated PSA                                                   Comment:6.7 on 02/16/2015   Hypertension                                                 COPD (chronic obstructive pulmonary disease) (*              Hyperlipidemia                                               Narcolepsy                                                   Alcohol abuse                                                Genital herpes                                               Tobacco abuse                                                Rhinitis  Benign essential HTN                            02/17/2015    Essential  (primary) hypertension                02/18/2015    Chronic obstructive pulmonary disease ([place])     04/28/2014    BPH with obstruction/lower urinary tract sympt* 04/12/2015     AA (alcohol abuse)                              02/28/2014    Combined fat and carbohydrate induced hyperlip* 02/28/2014    Cellulitis                                      09/21/2014    Elevated prostate specific antigen (PSA)        02/16/2015     Lesion of external ear                          04/28/2014    Sleep apnea                                                    Comment:histort had surgery better   Apnea, sleep                                    02/28/2014    Cancer ([place])                                                 Adenocarcinoma of prostate ([place])                05/22/2015    Reproductive/Obstetrics negative OB ROS                            Anesthesia Physical Anesthesia Plan  ASA: III  Anesthesia Plan: General   Post-op Pain Management:    Induction:   Airway Management Planned:   Additional Equipment:   Intra-op Plan:   Post-operative Plan:   Informed Consent: I have reviewed the patients History and Physical, chart, labs and discussed the procedure including the risks, benefits and alternatives for the proposed anesthesia with the patient or authorized representative who has indicated his/her understanding and acceptance.   Dental Advisory Given  Plan Discussed with: Anesthesiologist, CRNA and Surgeon  Anesthesia Plan Comments:         Anesthesia Quick Evaluation

## 2015-11-20 NOTE — Anesthesia Postprocedure Evaluation (Signed)
Anesthesia Post Note  Patient: Robert Powell  Procedure(s) Performed: Procedure(s) (LRB): RADIOACTIVE SEED IMPLANT/BRACHYTHERAPY IMPLANT (N/A)  Patient location during evaluation: PACU Anesthesia Type: General Level of consciousness: awake and alert Pain management: pain level controlled Vital Signs Assessment: post-procedure vital signs reviewed and stable Respiratory status: spontaneous breathing, nonlabored ventilation, respiratory function stable and patient connected to nasal cannula oxygen Cardiovascular status: blood pressure returned to baseline and stable Postop Assessment: no signs of nausea or vomiting Anesthetic complications: no    Last Vitals:  Filed Vitals:   11/20/15 0959 11/20/15 1136  BP: 136/77 124/74  Pulse: 74   Temp:    Resp: 20     Last Pain:  Filed Vitals:   11/20/15 1140  PainSc: 0-No pain                 Martha Clan

## 2015-11-20 NOTE — Discharge Instructions (Signed)
Brachytherapy for Prostate Cancer, Care After Refer to this sheet in the next few weeks. These instructions provide you with information on caring for yourself after your procedure. Your health care provider may also give you more specific instructions. Your treatment has been planned according to current medical practices, but problems sometimes occur. Call your health care provider if you have any problems or questions after your procedure. WHAT TO EXPECT AFTER THE PROCEDURE The area behind the scrotum will probably be tender and bruised. For a short period of time you may have:  Difficulty passing urine. You may need a catheter for a few days to a month.  Blood in the urine or semen.  A feeling of constipation because of prostate swelling.  Frequent feeling of an urgent need to urinate. For a long period of time you may have:  Inflammation of the rectum. This happens in about 2% of people who have the procedure.  Erection problems. These vary with age and occur in about 15-40% of men.  Difficulty urinating. This is caused by scarring in the urethra.  Diarrhea. HOME CARE INSTRUCTIONS   Take medicines only as directed by your health care provider.  You will probably have a catheter in your bladder for several days. You will have blood in the urine bag and should drink a lot of fluids to keep it a light red color.  Keep all follow-up visits as directed by your health care provider. If you have a catheter, it will be removed during one of these visits.  Try not to sit directly on the area behind the scrotum. A soft cushion can decrease the discomfort. Ice packs may also be helpful for the discomfort. Do not put ice directly on the skin.  Shower and wash the area behind the scrotum gently. Do not sit in a tub.  If you have had the brachytherapy that uses the seeds, limit your close contact with children and pregnant women for 2 months because of the radiation still in the prostate.  After that period of time, the levels drop off quickly. SEEK IMMEDIATE MEDICAL CARE IF:   You have a fever.  You have chills.  You have shortness of breath.  You have chest pain.  You have thick blood, like tomato juice, in the urine bag.  Your catheter is blocked so urine cannot get into the bag. Your bladder area or lower abdomen may be swollen.  There is excessive bleeding from your rectum. It is normal to have a little blood mixed with your stool.  There is severe discomfort in the treated area that does not go away with pain medicine.  You have abdominal discomfort.  You have severe nausea or vomiting.  You develop any new or unusual symptoms.   This information is not intended to replace advice given to you by your health care provider. Make sure you discuss any questions you have with your health care provider.   Document Released: 09/28/2010 Document Revised: 09/16/2014 Document Reviewed: 02/16/2013 Elsevier Interactive Patient Education 2016 [place]   1) The drugs that you were given will stay in your system until tomorrow so for the next 24 hours you should not:  A) Drive an automobile B) Make any legal decisions C) Drink any alcoholic beverage   2) You may resume regular meals tomorrow.  Today it is better to start with liquids and gradually work up to solid foods.  You may eat anything you prefer, but  it is better to start with liquids, then soup and crackers, and gradually work up to solid foods.   3) Please notify your doctor immediately if you have any unusual bleeding, trouble breathing, redness and pain at the surgery site, drainage, fever, or pain not relieved by medication.    4) Additional Instructions:        Please contact your physician with any problems or Same Day Surgery at [phone], Monday through Friday 6 am to 4 pm, or [place] at [facility] number at [phone].

## 2015-11-20 NOTE — Op Note (Signed)
Preoperative diagnosis: Adenocarcinoma of the prostate   Postoperative diagnosis: Same   Procedure: I-125 prostate seed implantation, cystoscopy  Surgeon: Hollice Espy M.D. , Lavena Stanford, M.D.   Anesthesia: General  Drains: none  Complications: none  Indications: 65 yo M with Gleason 3+4 prostate cancer, PSA 6.2 who has elected to undergo placement of brachytherapy seed placement for treatment of his prostate cancer.    Procedure: Patient was brought to operating suite and placement table in the supine position. At this time, a universal timeout protocol was performed, all team members were identified, Venodyne boots are placed, and he was administered IV Ancef in the preoperative period. He was placed in lithotomy position and prepped and draped in usual manner. Radiation oncology department placed a transrectal ultrasound probe anchoring stand/ grid and aligned with previous imaging from the volume study. Foley catheter was inserted without difficulty.  All needle passage was done with real-time transrectal ultrasound guidance in both the transverse and sagittal plains in order to achieve the desired preplanned position. A total of 17 needles were placed.  60 active seeds were implanted. The Foley catheter was removed and a rigid cystoscopy failed to show any seeds outside the prostate without evidence of trauma to the urethral, prostatic fossa, or bladder.  The bladder was drained.  A fluoroscopic image was then obtained showing excellent distrubution of the brachytherapy seeds.  Each seed was counted and counts were correct.    The patient was then repositioned in the supine position, reversed from anesthesia, and taken to the PACU in stable condition.  Plan: Patient will need to f/u in 4 weeks to assess voiding and to discuss adjuvant ADT x 12 months

## 2015-11-20 NOTE — Progress Notes (Signed)
Radiation Oncology Follow up Note  Name: ASHELY PERSSON   Date:   09/13/2015 MRN:  [id number] DOB: 1951-03-20    This 65 y.o. male presents to the clinic today for I-125 interstitial implant for prostate cancer.  REFERRING PROVIDER: No ref. provider found  HPI: .Patient is a 65 year old male with multiple comorbidities including narcolepsy alcohol abuse COPD and sleep apnea who presented with an elevated PSA of 6.2. Underwent 12 core transrectal ultrasound-guided biopsies showing mostly Gleason 6 adenocarcinoma (3+3). 1 core was Gleason 7 (3+4). Prostate was measured approximately 35 cc. He has CT scan showing lytic lesions at L2-3-4 although on MRI scan and bone scan this was thought to be degenerative changes. Patient does have mild left erectile dysfunction. He's been seen by urology is now referred to radiation oncology for opinion. He specifically denies significant lower urinary tract symptoms. He was brought to the operating room and underwent ultrasound-guided I-125 interstitial implant. 60 seeds were placed with the help of urology under ultrasound guidance. Cystoscopy showed no evidence of seeds migrating into the bladder. Plain film confirmation of number seeds was performed showing accurate number of seeds placed.  COMPLICATIONS OF TREATMENT: none  FOLLOW UP COMPLIANCE: keeps appointments   PHYSICAL EXAM:  BP 124/74 mmHg  Pulse 74  Temp(Src) 97.5 F (36.4 C) (Tympanic)  Resp 20  Ht 6' (1.829 m)  Wt 238 lb (107.956 kg)  BMI 32.27 kg/m2  SpO2 95%   RADIOLOGY RESULTS: Plain film of seed sources was made for accuracy  PLAN: Patient tolerated his procedure well. Had excellent placement of sources. He has a one-month follow-up with CT scan for quality assurance. We discussed case with family family knows to call with any concerns. All follow-up performed by urology and myself.  I would like to take this opportunity for allowing me to participate in the care of your patient.Armstead Peaks., MD

## 2015-11-20 NOTE — Anesthesia Procedure Notes (Signed)
Anesthesia Regional Block:   Narrative:    Procedure Name: LMA Insertion Performed by: Lance Muss Pre-anesthesia Checklist: Patient identified, Emergency Drugs available, Suction available, Patient being monitored and Timeout performed Patient Re-evaluated:Patient Re-evaluated prior to inductionOxygen Delivery Method: Circle system utilized Preoxygenation: Pre-oxygenation with 100% oxygen Intubation Type: IV induction LMA: LMA inserted LMA Size: 4.5 Number of attempts: 1 Placement Confirmation: positive ETCO2 Tube secured with: Tape Dental Injury: Teeth and Oropharynx as per pre-operative assessment

## 2015-11-22 ENCOUNTER — Ambulatory Visit: Payer: Self-pay | Admitting: Radiation Oncology

## 2015-11-22 ENCOUNTER — Ambulatory Visit: Payer: Commercial Managed Care - HMO

## 2015-11-28 ENCOUNTER — Ambulatory Visit: Payer: Self-pay | Admitting: Radiation Oncology

## 2015-11-28 ENCOUNTER — Ambulatory Visit: Payer: Commercial Managed Care - HMO

## 2015-11-28 ENCOUNTER — Inpatient Hospital Stay: Admission: RE | Admit: 2015-11-28 | Payer: Self-pay | Source: Ambulatory Visit | Admitting: Radiation Oncology

## 2015-12-12 ENCOUNTER — Ambulatory Visit: Payer: Commercial Managed Care - HMO

## 2015-12-19 ENCOUNTER — Ambulatory Visit (INDEPENDENT_AMBULATORY_CARE_PROVIDER_SITE_OTHER): Payer: Commercial Managed Care - HMO | Admitting: Urology

## 2015-12-19 ENCOUNTER — Encounter: Payer: Self-pay | Admitting: Urology

## 2015-12-19 VITALS — BP 112/69 | HR 98 | Ht 72.0 in | Wt 236.0 lb

## 2015-12-19 DIAGNOSIS — I10 Essential (primary) hypertension: Secondary | ICD-10-CM | POA: Diagnosis not present

## 2015-12-19 DIAGNOSIS — C61 Malignant neoplasm of prostate: Secondary | ICD-10-CM

## 2015-12-19 MED ORDER — LEUPROLIDE ACETATE (3 MONTH) 22.5 MG IM KIT
22.5000 mg | PACK | Freq: Once | INTRAMUSCULAR | Status: AC
  Administered 2015-12-19: 22.5 mg via INTRAMUSCULAR

## 2015-12-19 NOTE — Patient Instructions (Signed)
Leuprolide injection  What is this medicine?  LEUPROLIDE (loo PROE lide) is a man-made hormone. It is used to treat the symptoms of prostate cancer. This medicine may also be used to treat children with early onset of puberty. It may be used for other hormonal conditions.  This medicine may be used for other purposes; ask your health care provider or pharmacist if you have questions.  What should I tell my health care provider before I take this medicine?  They need to know if you have any of these conditions:  -diabetes  -heart disease or previous heart attack  -high blood pressure  -high cholesterol  -pain or difficulty passing urine  -spinal cord metastasis  -stroke  -tobacco smoker  -an unusual or allergic reaction to leuprolide, benzyl alcohol, other medicines, foods, dyes, or preservatives  -pregnant or trying to get pregnant  -breast-feeding  How should I use this medicine?  This medicine is for injection under the skin or into a muscle. You will be taught how to prepare and give this medicine. Use exactly as directed. Take your medicine at regular intervals. Do not take your medicine more often than directed.  It is important that you put your used needles and syringes in a special sharps container. Do not put them in a trash can. If you do not have a sharps container, call your pharmacist or healthcare provider to get one.  Talk to your pediatrician regarding the use of this medicine in children. While this medicine may be prescribed for children as young as 8 years for selected conditions, precautions do apply.  Overdosage: If you think you have taken too much of this medicine contact a poison control center or emergency room at once.  NOTE: This medicine is only for you. Do not share this medicine with others.  What if I miss a dose?  If you miss a dose, take it as soon as you can. If it is almost time for your next dose, take only that dose. Do not take double or extra doses.  What may interact with this  medicine?  Do not take this medicine with any of the following medications:  -chasteberry  This medicine may also interact with the following medications:  -herbal or dietary supplements, like black cohosh or DHEA  -male hormones, like estrogens or progestins and birth control pills, patches, rings, or injections  -male hormones, like testosterone  This list may not describe all possible interactions. Give your health care provider a list of all the medicines, herbs, non-prescription drugs, or dietary supplements you use. Also tell them if you smoke, drink alcohol, or use illegal drugs. Some items may interact with your medicine.  What should I watch for while using this medicine?  Visit your doctor or health care professional for regular checks on your progress. During the first week, your symptoms may get worse, but then will improve as you continue your treatment. You may get hot flashes, increased bone pain, increased difficulty passing urine, or an aggravation of nerve symptoms. Discuss these effects with your doctor or health care professional, some of them may improve with continued use of this medicine.  Male patients may experience a menstrual cycle or spotting during the first 2 months of therapy with this medicine. If this continues, contact your doctor or health care professional.  What side effects may I notice from receiving this medicine?  Side effects that you should report to your doctor or health care professional as soon as   possible:  -allergic reactions like skin rash, itching or hives, swelling of the face, lips, or tongue  -breathing problems  -chest pain  -depression or memory disorders  -pain in your legs or groin  -pain at site where injected  -severe headache  -swelling of the feet and legs  -visual changes  -vomiting  Side effects that usually do not require medical attention (report to your doctor or health care professional if they continue or are bothersome):  -breast swelling or  tenderness  -decrease in sex drive or performance  -diarrhea  -hot flashes  -loss of appetite  -muscle, joint, or bone pains  -nausea  -redness or irritation at site where injected  -skin problems or acne  This list may not describe all possible side effects. Call your doctor for medical advice about side effects. You may report side effects to FDA at 1-800-FDA-1088.  Where should I keep my medicine?  Keep out of the reach of children.  Store below 25 degrees C (77 degrees F). Do not freeze. Protect from light. Do not use if it is not clear or if there are particles present. Throw away any unused medicine after the expiration date.  NOTE: This sheet is a summary. It may not cover all possible information. If you have questions about this medicine, talk to your doctor, pharmacist, or health care provider.      2016, Elsevier/Gold Standard. (2009-01-10 13:26:20)

## 2015-12-19 NOTE — Progress Notes (Addendum)
12/19/2015   Marland Kitchen 10/11/1950 [id number]  Referring provider: Glendon Axe, MD [facility] [place], [place] [address]  Chief Complaint  Patient presents with  . Prostate Cancer    4wk post seed placement    HPI: 65 year old male diagnosed with primarily Gleason 3+3 and single core of 3+4 prostate cancer in 04/2015 involving 7/12 cores bilaterally with largest burden involving 100% of the left lateral core with Gleason 3+4 (only 5% or less of 4 component).  PSA 6.2 at the time of dx. 35 cc prostate with firm nodule of the left mid gland.  TRUS ultrasound with diffuse prostatic calcifications and hypoechoic area at site of nodule.     CT abd/ pelvis showed multiple lytic lesions in L2 L3 L4 vertebral bodies.  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 There is no obvious pelvic lymphadenopathy.   As part of follow-up, he underwent bone scan which was negative for any evidence of metastatic disease. He also had chest imaging given his history of extensive smoking which showed a very small stable pulmonary nodule. He had a follow-up MRI today at [facility] of the spine which shows generative disc disease and no evidence of metastatic bony disease.  He elected to undergo brachytherapy seed placement for treatment of his prostate cancer with myself and Dr. Baruch Gouty on 11/20/15.  He tolerated the procedure well and has no complaints today. He's been taking Flomax prophylactically would like to stop this. He has no voiding complaints.  He returns to the office today postoperatively to discuss adjuvant ADT  PMH: Past Medical History  Diagnosis Date  . Elevated PSA     6.7 on 02/16/2015  . Hypertension   . COPD (chronic obstructive pulmonary disease) ([place])   . Hyperlipidemia   . Narcolepsy   . Alcohol abuse   . Genital herpes   . Tobacco abuse   . Rhinitis   . Benign essential HTN 02/17/2015  . Essential (primary) hypertension  02/18/2015  . Chronic obstructive pulmonary disease ([place]) 04/28/2014  . 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 AA (alcohol abuse) 02/28/2014  . 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 Cellulitis 09/21/2014  . Elevated prostate specific antigen (PSA) 02/16/2015  . Lesion of external ear 04/28/2014  . Sleep apnea     histort had surgery better  . Apnea, sleep 02/28/2014  . Cancer ([place])   . Adenocarcinoma of prostate ([place]) 05/22/2015    Surgical History: Past Surgical History  Procedure Laterality Date  . Tonsillectomy    . Radioactive seed implant N/A 11/20/2015    Procedure: RADIOACTIVE SEED IMPLANT/BRACHYTHERAPY IMPLANT;  Surgeon: Hollice Espy, MD;  Location: [facility] ORS;  Service: Urology;  Laterality: N/A;    Home Medications:    Medication List       This list is accurate as of: 12/19/15  5:46 PM.  Always use your most recent med list.               amLODipine 5 MG tablet  Commonly known as:  NORVASC  Take by mouth.     docusate sodium 100 MG capsule  Commonly known as:  COLACE  Take 1 capsule (100 mg total) by mouth 2 (two) times daily.     ipratropium-albuterol 0.5-2.5 (3) MG/3ML Soln  Commonly known as:  DUONEB     lisinopril-hydrochlorothiazide 20-12.5 MG tablet  Commonly known as:  PRINZIDE,ZESTORETIC  Take by mouth.  tamsulosin 0.4 MG Caps capsule  Commonly known as:  FLOMAX  Take 1 capsule (0.4 mg total) by mouth daily.     tiotropium 18 MCG inhalation capsule  Commonly known as:  [place] into inhaler and inhale.     VENTOLIN HFA 108 (90 Base) MCG/ACT inhaler  Generic drug:  albuterol  Inhale into the lungs.        Allergies: No Known Allergies  Family History: Family History  Problem Relation Age of Onset  . Heart attack Mother   . Heart disease Mother   . Heart attack Father   . Kidney disease Neg Hx   . Prostate cancer Neg Hx     Social History:  reports that he has been smoking.  He  does not have any smokeless tobacco history on file. He reports that he drinks alcohol. He reports that he does not use illicit drugs.  ROS 12 point review of systems was performed and is otherwise negative.  Physical Exam: BP 112/69 mmHg  Pulse 98  Ht 6' (1.829 m)  Wt 236 lb (107.049 kg)  BMI 32.00 kg/m2  Constitutional:  Alert and oriented, No acute distress.  Presents today to the office with his wife. HEENT: [place] AT, moist mucus membranes.  Trachea midline, no masses.  Slightly erythematous sclera. Cardiovascular: No clubbing, cyanosis, or edema. Respiratory: Normal respiratory effort, no increased work of breathing.. Skin: No rashes, bruises or suspicious lesions.  Fascial telangiectasia. Neurologic: Grossly intact, no focal deficits, moving all 4 extremities. Psychiatric: Normal mood and affect.  Laboratory Data: Comprehensive Metabolic Panel (CMP) - Final result (02/16/2015 3:59 PM) Comprehensive Metabolic Panel (CMP) - Final result (02/16/2015 3:59 PM)  Component Value Range  Glucose 113 (H) 70-110 mg/dL  Sodium 132 (L) 136-145 mmol/L  Potassium 3.7 3.6-5.1 mmol/L  Chloride 95 (L) 97-109 mmol/L  Carbon Dioxide (CO2) 30.4 22.0-32.0 mmol/L  Urea Nitrogen (BUN) 17 7-25 mg/dL  Creatinine 1.1 0.7-1.3 mg/dL  Glomerular Filtration Rate (eGFR), MDRD Estimate 67 >60 mL/min/1.73sq m  Calcium 9.4 8.7-10.3 mg/dL  AST  20 8-39 U/L  ALT  19 6-57 U/L  Alk Phos (alkaline Phosphatase) 47 34-104 U/L  Albumin 4.3 3.5-4.8 g/dL  Bilirubin, Total 0.4 0.3-1.2 mg/dL  Protein, Total 7.0 6.1-7.9 g/dL  A/G Ratio 1.6     Complete Blood Count (CBC) - Final result (02/16/2015 3:59 PM) Complete Blood Count (CBC) - Final result (02/16/2015 3:59 PM)  Component Value Range  WBC (White Blood Cell Count) 6.7 4.1-10.2 10^3/uL  RBC (Red Blood Cell Count) 4.72 4.69-6.13 10^6/uL  Hemoglobin 15.3 14.1-18.1 gm/dL  Hematocrit 43.7 40.0-52.0 %  MCV (Mean Corpuscular Volume) 92.6 80.0-100.0 fl  MCH (Mean  Corpuscular Hemoglobin) 32.4 (H) 27.0-31.2 pg  MCHC (Mean Corpuscular Hemoglobin Concentration) 35.0 32.0-36.0 gm/dL  Platelet Count 187 150-450 10^3/uL  RDW-CV (Red Cell Distribution Width) 13.0 11.6-14.8 %  MPV (Mean Platelet Volume) 8.9 8.0-10.0 fl     Lab Results  Component Value Date   PSA 7.78* 08/28/2015    Assessment & Plan:    1. Prostate cancer (H  T2aN0M0 prostate cancer s/p brachytherapy I-125 seed placement 11/2014.  Today he rated turns to discuss the possibility of short term adjuvant ADT in addition to his brachytherapy. The studies indicate that perhaps there may be a survival advantage with the short-term 4-6 months of ADT in addition to radiotherapy. We discussed all the risks and benefits in detail. He understands the risk of hot flashes, loss of muscle mass, poor libido, weight  gain, and cardiac issues if continued long-term. We also discussed the importance of bone health today.  Discussed importance of bone health on ADT, recommend 1000-1200 mg daily calcium suppliment and [phone] IU vit D daily.  Also encouraged weight being exercises and cardiovascular health.  He would like to proceed with Lupron. He was given a 3 month Depo today. He will return in 3 months for his next and final injection.  Return in about 3 months (around 03/19/2016) for Lupron.   Hollice Espy, MD  [facility] [address] [place], [place] [address] [phone]

## 2015-12-19 NOTE — Progress Notes (Signed)
Lupron IM Injection   Due to Prostate Cancer patient is present today for a Lupron Injection.  Medication: Lupron 3 month Dose: 22.5 mg  Location: right upper outer buttocks Lot: [id number]  Exp: 06/06/2018  Patient tolerated well, no complications were noted  Performed by: K.Tad Fancher,CMA   Follow up: 3 mths

## 2015-12-26 ENCOUNTER — Encounter: Payer: Self-pay | Admitting: Radiation Oncology

## 2015-12-26 ENCOUNTER — Ambulatory Visit
Admission: RE | Admit: 2015-12-26 | Discharge: 2015-12-26 | Disposition: A | Discharge status: Home / Self Care | Payer: Commercial Managed Care - HMO | Source: Ambulatory Visit | Attending: Radiation Oncology | Admitting: Radiation Oncology

## 2015-12-26 VITALS — BP 154/88 | HR 97 | Temp 97.6°F | Wt 241.3 lb

## 2015-12-26 DIAGNOSIS — Z510 Encounter for antineoplastic radiation therapy: Secondary | ICD-10-CM | POA: Insufficient documentation

## 2015-12-26 DIAGNOSIS — C61 Malignant neoplasm of prostate: Secondary | ICD-10-CM | POA: Insufficient documentation

## 2015-12-26 NOTE — Progress Notes (Signed)
Radiation Oncology Follow up Note  Name: Robert Powell   Date:   12/26/2015 MRN:  [id number] DOB: [birth year]    This 65 y.o. male presents to the clinic today for one-month follow-up status post I-125 interstitial implant for adenocarcinoma the prostate.  REFERRING PROVIDER: Glendon Axe, MD  HPI: Patient is a 65 year old male now one month out having completed I-125 interstitial implant for mostly Gleason 6 (3+3) adenocarcinoma the prostate.Marland Kitchen He is doing well he's been started on short-term Lupron therapy by urology. He has been having some slight hot flashes. He is discontinuing his Flomax. He has not had any exacerbation lower urinary tract symptoms diarrhea or fatigue. Patient has known narcolepsy  COMPLICATIONS OF TREATMENT: none  FOLLOW UP COMPLIANCE: keeps appointments   PHYSICAL EXAM:  BP 154/88 mmHg  Pulse 97  Temp(Src) 97.6 F (36.4 C)  Wt 241 lb 4.7 oz (109.45 kg) On rectal exam rectal sphincter tone is good. Prostate is smooth contracted without evidence of nodularity or mass. Sulcus is preserved bilaterally. No discrete nodularity is identified. No other rectal abnormalities are noted. Well-developed well-nourished patient in NAD. HEENT reveals PERLA, EOMI, discs not visualized.  Oral cavity is clear. No oral mucosal lesions are identified. Neck is clear without evidence of cervical or supraclavicular adenopathy. Lungs are clear to A&P. Cardiac examination is essentially unremarkable with regular rate and rhythm without murmur rub or thrill. Abdomen is benign with no organomegaly or masses noted. Motor sensory and DTR levels are equal and symmetric in the upper and lower extremities. Cranial nerves II through XII are grossly intact. Proprioception is intact. No peripheral adenopathy or edema is identified. No motor or sensory levels are noted. Crude visual fields are within normal range.  RADIOLOGY RESULTS: Quality assurance CT scan performed showing excellent placement of  sources  PLAN: Present time patient is doing well I'll wait another 2-3 months to perform a PSA level on him. He continues on short-term ADT therapy. I'm please was overall progress. I've set up a follow-up appointment in about 3 months and he will also have his PSA drawn at that time. Patient knows to call any sooner with complaints.  I would like to take this opportunity for allowing me to participate in the care of your patient.Armstead Peaks., MD

## 2015-12-27 DIAGNOSIS — F1020 Alcohol dependence, uncomplicated: Secondary | ICD-10-CM | POA: Diagnosis not present

## 2015-12-27 DIAGNOSIS — I10 Essential (primary) hypertension: Secondary | ICD-10-CM | POA: Diagnosis not present

## 2015-12-27 DIAGNOSIS — J449 Chronic obstructive pulmonary disease, unspecified: Secondary | ICD-10-CM | POA: Diagnosis not present

## 2016-01-02 DIAGNOSIS — Z510 Encounter for antineoplastic radiation therapy: Secondary | ICD-10-CM | POA: Diagnosis not present

## 2016-01-02 DIAGNOSIS — C61 Malignant neoplasm of prostate: Secondary | ICD-10-CM | POA: Diagnosis not present

## 2016-03-22 ENCOUNTER — Ambulatory Visit (INDEPENDENT_AMBULATORY_CARE_PROVIDER_SITE_OTHER): Payer: Commercial Managed Care - HMO | Admitting: Urology

## 2016-03-22 ENCOUNTER — Encounter: Payer: Self-pay | Admitting: Urology

## 2016-03-22 VITALS — BP 157/68 | HR 76 | Ht 72.0 in | Wt 226.3 lb

## 2016-03-22 DIAGNOSIS — C61 Malignant neoplasm of prostate: Secondary | ICD-10-CM | POA: Diagnosis not present

## 2016-03-22 MED ORDER — LEUPROLIDE ACETATE (3 MONTH) 22.5 MG IM KIT
22.5000 mg | PACK | Freq: Once | INTRAMUSCULAR | Status: AC
  Administered 2016-03-22: 22.5 mg via INTRAMUSCULAR

## 2016-03-22 NOTE — Progress Notes (Signed)
Lupron IM Injection   Due to Prostate Cancer patient is present today for a Lupron Injection.  Medication: Lupron 3 month Dose: 22.5 mg  Location: left upper outer buttocks Lot: [id number] Exp: 06/2018  Patient tolerated well, no complications were noted  Performed by: Toniann Fail, LPN

## 2016-03-22 NOTE — Progress Notes (Signed)
03/22/16  Marland Kitchen 07-06-1951 [id number]  Referring provider: Glendon Axe, MD [facility] [place], [place] [address]  Chief Complaint  Patient presents with  . Prostate Cancer  . Follow-up    HPI: 65 year old male diagnosed with primarily Gleason 3+3 and single core of 3+4 prostate cancer in 04/2015 involving 7/12 cores bilaterally with largest burden involving 100% of the left lateral core with Gleason 3+4 (only 5% or less of 4 component).  PSA 6.2 at the time of dx. 35 cc prostate with firm nodule of the left mid gland.  TRUS ultrasound with diffuse prostatic calcifications and hypoechoic area at site of nodule.     CT abd/ pelvis showed multiple lytic lesions in L2 L3 L4 vertebral bodies.  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 There is no obvious pelvic lymphadenopathy.   As part of follow-up, he underwent bone scan which was negative for any evidence of metastatic disease. He also had chest imaging given his history of extensive smoking which showed a very small stable pulmonary nodule. Follow up MRI today at [facility] of the spine which shows generative disc disease and no evidence of metastatic bony disease.  He elected to undergo brachytherapy seed placement for treatment of his prostate cancer with myself and Dr. Baruch Gouty on 11/20/15.  He tolerated the procedure well and has no complaints today. He has no voiding complaints and has stopped flomax.  He is on ADT x 3 months and returns for last injection (short term).    He does complain today of hot flashes, otherwise doing well.  He has ben taking calcium and vit D supplementation.  PMH: Past Medical History  Diagnosis Date  . Elevated PSA     6.7 on 02/16/2015  . Hypertension   . COPD (chronic obstructive pulmonary disease) ([place])   . Hyperlipidemia   . Narcolepsy   . Alcohol abuse   . Genital herpes   . Tobacco abuse   . Rhinitis   . Benign essential HTN 02/17/2015    . Essential (primary) hypertension 02/18/2015  . Chronic obstructive pulmonary disease ([place]) 04/28/2014  . 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 AA (alcohol abuse) 02/28/2014  . 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 Cellulitis 09/21/2014  . Elevated prostate specific antigen (PSA) 02/16/2015  . Lesion of external ear 04/28/2014  . Sleep apnea     histort had surgery better  . Apnea, sleep 02/28/2014  . Cancer ([place])   . Adenocarcinoma of prostate ([place]) 05/22/2015    Surgical History: Past Surgical History  Procedure Laterality Date  . Tonsillectomy    . Radioactive seed implant N/A 11/20/2015    Procedure: RADIOACTIVE SEED IMPLANT/BRACHYTHERAPY IMPLANT;  Surgeon: Hollice Espy, MD;  Location: [facility] ORS;  Service: Urology;  Laterality: N/A;    Home Medications:        Medication List       This list is accurate as of: 03/22/16 11:59 PM.  Always use your most recent med list.               amLODipine 5 MG tablet  Commonly known as:  NORVASC  Take by mouth.     calcium-vitamin D 500-200 MG-UNIT tablet  Take by mouth.     docusate sodium 100 MG capsule  Commonly known as:  COLACE  Take 1 capsule (100 mg total) by mouth 2 (two) times daily.     ipratropium-albuterol 0.5-2.5 (  3) MG/3ML Soln  Commonly known as:  DUONEB     lisinopril-hydrochlorothiazide 20-12.5 MG tablet  Commonly known as:  PRINZIDE,ZESTORETIC  Take by mouth.     tamsulosin 0.4 MG Caps capsule  Commonly known as:  FLOMAX  Take 1 capsule (0.4 mg total) by mouth daily.     tiotropium 18 MCG inhalation capsule  Commonly known as:  [place] into inhaler and inhale.     VENTOLIN HFA 108 (90 Base) MCG/ACT inhaler  Generic drug:  albuterol  Inhale into the lungs.        Allergies: No Known Allergies  Family History: Family History  Problem Relation Age of Onset  . Heart attack Mother   . Heart disease Mother   . Heart attack Father   .  Kidney disease Neg Hx   . Prostate cancer Neg Hx     Social History:  reports that he has been smoking.  He does not have any smokeless tobacco history on file. He reports that he drinks alcohol. He reports that he does not use illicit drugs.  ROS UROLOGY Frequent Urination?: No Hard to postpone urination?: No Burning/pain with urination?: No Get up at night to urinate?: No Leakage of urine?: No Urine stream starts and stops?: No Trouble starting stream?: No Do you have to strain to urinate?: No Blood in urine?: No Urinary tract infection?: No Sexually transmitted disease?: No Injury to kidneys or bladder?: No Painful intercourse?: No Weak stream?: No Erection problems?: No Penile pain?: No Gastrointestinal Nausea?: No Vomiting?: No Indigestion/heartburn?: No Diarrhea?: No Constipation?: No Constitutional Fever: No Night sweats?: Yes Weight loss?: No Fatigue?: No Skin Skin rash/lesions?: No Itching?: No Eyes Blurred vision?: No Double vision?: No Ears/Nose/Throat Sore throat?: No Sinus problems?: No Hematologic/Lymphatic Swollen glands?: No Easy bruising?: No Cardiovascular Leg swelling?: Yes Chest pain?: No Respiratory Cough?: Yes Shortness of breath?: Yes Endocrine Excessive thirst?: No Musculoskeletal Back pain?: No Joint pain?: No Neurological Headaches?: No Dizziness?: Yes Psychologic Depression?: No Anxiety?: No   Physical Exam: BP 157/68 mmHg  Pulse 76  Ht 6' (1.829 m)  Wt 226 lb 4.8 oz (102.649 kg)  BMI 30.69 kg/m2  Constitutional:  Alert and oriented, No acute distress.  Presents today to the office with his wife. HEENT: [place] AT, moist mucus membranes.  Trachea midline, no masses.  Slightly erythematous sclera. Cardiovascular: No clubbing, cyanosis, or edema. Respiratory: Normal respiratory effort, no increased work of breathing.. Abd: soft, NT, ND Skin: No rashes, bruises or suspicious lesions.  Facial telangiectasia. Neurologic:  Grossly intact, no focal deficits, moving all 4 extremities. Psychiatric: Normal mood and affect.  Laboratory Data: Comprehensive Metabolic Panel (CMP) - Final result (02/16/2015 3:59 PM) Comprehensive Metabolic Panel (CMP) - Final result (02/16/2015 3:59 PM)  Component Value Range  Glucose 113 (H) 70-110 mg/dL  Sodium 132 (L) 136-145 mmol/L  Potassium 3.7 3.6-5.1 mmol/L  Chloride 95 (L) 97-109 mmol/L  Carbon Dioxide (CO2) 30.4 22.0-32.0 mmol/L  Urea Nitrogen (BUN) 17 7-25 mg/dL  Creatinine 1.1 0.7-1.3 mg/dL  Glomerular Filtration Rate (eGFR), MDRD Estimate 67 >60 mL/min/1.73sq m  Calcium 9.4 8.7-10.3 mg/dL  AST  20 8-39 U/L  ALT  19 6-57 U/L  Alk Phos (alkaline Phosphatase) 47 34-104 U/L  Albumin 4.3 3.5-4.8 g/dL  Bilirubin, Total 0.4 0.3-1.2 mg/dL  Protein, Total 7.0 6.1-7.9 g/dL  A/G Ratio 1.6     Complete Blood Count (CBC) - Final result (02/16/2015 3:59 PM) Complete Blood Count (CBC) - Final result (02/16/2015 3:59 PM)  Component Value Range  WBC (White Blood Cell Count) 6.7 4.1-10.2 10^3/uL  RBC (Red Blood Cell Count) 4.72 4.69-6.13 10^6/uL  Hemoglobin 15.3 14.1-18.1 gm/dL  Hematocrit 43.7 40.0-52.0 %  MCV (Mean Corpuscular Volume) 92.6 80.0-100.0 fl  MCH (Mean Corpuscular Hemoglobin) 32.4 (H) 27.0-31.2 pg  MCHC (Mean Corpuscular Hemoglobin Concentration) 35.0 32.0-36.0 gm/dL  Platelet Count 187 150-450 10^3/uL  RDW-CV (Red Cell Distribution Width) 13.0 11.6-14.8 %  MPV (Mean Platelet Volume) 8.9 8.0-10.0 fl     Lab Results  Component Value Date   PSA 7.78* 08/28/2015    Assessment & Plan:    1. Prostate cancer (H  T2aN0M0 prostate cancer s/p brachytherapy I-125 seed placement 11/2014.   Currently in short course Lupron x 6 months, due for second and final Lupron Continue calcium and vit d Will follow up with Dr. Baruch Gouty in ~3 months for PSA  Return in about 6 months (around 09/22/2016).   Hollice Espy, MD  [facility] [address], [place], [place] [address] [phone]

## 2016-03-23 ENCOUNTER — Encounter: Payer: Self-pay | Admitting: Urology

## 2016-04-10 DIAGNOSIS — I6523 Occlusion and stenosis of bilateral carotid arteries: Secondary | ICD-10-CM | POA: Diagnosis not present

## 2016-04-10 DIAGNOSIS — R0602 Shortness of breath: Secondary | ICD-10-CM | POA: Diagnosis not present

## 2016-04-10 DIAGNOSIS — Z720 Tobacco use: Secondary | ICD-10-CM | POA: Diagnosis not present

## 2016-04-10 DIAGNOSIS — I10 Essential (primary) hypertension: Secondary | ICD-10-CM | POA: Diagnosis not present

## 2016-04-10 DIAGNOSIS — J449 Chronic obstructive pulmonary disease, unspecified: Secondary | ICD-10-CM | POA: Diagnosis not present

## 2016-04-10 DIAGNOSIS — Z789 Other specified health status: Secondary | ICD-10-CM | POA: Diagnosis not present

## 2016-04-10 DIAGNOSIS — E782 Mixed hyperlipidemia: Secondary | ICD-10-CM | POA: Diagnosis not present

## 2016-05-03 DIAGNOSIS — R0602 Shortness of breath: Secondary | ICD-10-CM | POA: Diagnosis not present

## 2016-05-07 ENCOUNTER — Other Ambulatory Visit: Payer: Self-pay | Admitting: *Deleted

## 2016-05-07 DIAGNOSIS — C61 Malignant neoplasm of prostate: Secondary | ICD-10-CM

## 2016-05-08 DIAGNOSIS — Z720 Tobacco use: Secondary | ICD-10-CM | POA: Diagnosis not present

## 2016-05-08 DIAGNOSIS — I6523 Occlusion and stenosis of bilateral carotid arteries: Secondary | ICD-10-CM | POA: Diagnosis not present

## 2016-05-08 DIAGNOSIS — I77811 Abdominal aortic ectasia: Secondary | ICD-10-CM | POA: Diagnosis not present

## 2016-05-08 DIAGNOSIS — R0602 Shortness of breath: Secondary | ICD-10-CM | POA: Diagnosis not present

## 2016-05-09 ENCOUNTER — Encounter: Payer: Self-pay | Admitting: Radiation Oncology

## 2016-05-09 ENCOUNTER — Other Ambulatory Visit: Payer: Self-pay | Admitting: *Deleted

## 2016-05-09 ENCOUNTER — Inpatient Hospital Stay: Payer: Commercial Managed Care - HMO | Attending: Radiation Oncology

## 2016-05-09 ENCOUNTER — Ambulatory Visit
Admission: RE | Admit: 2016-05-09 | Discharge: 2016-05-09 | Disposition: A | Discharge status: Home / Self Care | Payer: Commercial Managed Care - HMO | Source: Ambulatory Visit | Attending: Radiation Oncology | Admitting: Radiation Oncology

## 2016-05-09 VITALS — BP 143/88 | HR 78 | Temp 97.8°F | Resp 20 | Ht 72.0 in | Wt 227.0 lb

## 2016-05-09 DIAGNOSIS — C61 Malignant neoplasm of prostate: Secondary | ICD-10-CM | POA: Insufficient documentation

## 2016-05-09 DIAGNOSIS — Z923 Personal history of irradiation: Secondary | ICD-10-CM | POA: Insufficient documentation

## 2016-05-09 LAB — PSA: PSA: 0.01 ng/mL (ref 0.00–4.00)

## 2016-05-09 NOTE — Progress Notes (Signed)
Radiation Oncology Follow up Note  Name: Robert Powell   Date:   05/09/2016 MRN:  [id number] DOB: 03-28-1951    This 65 y.o. male presents to the clinic today for 6 month follow-up status post I-125 interstitial implant for adenocarcinoma the prostate.  REFERRING PROVIDER: Glendon Axe, MD  HPI: Patient is a 65 year old male now out 6 months having completed I-125 interstitial implant for a Gleason 6 (3+3) adenocarcinoma the prostate. He is seen today in routine follow-up and is doing well. He specifically denies diarrhea dysuria or any other GI/GU complaints. He had his PSA tested prior to his visit today..  COMPLICATIONS OF TREATMENT: none  FOLLOW UP COMPLIANCE: keeps appointments   PHYSICAL EXAM:  BP (!) 143/88   Pulse 78   Temp 97.8 F (36.6 C)   Resp 20   Ht 6' (1.829 m)   Wt 226 lb 15.4 oz (103 kg)   BMI 30.78 kg/m  On rectal exam rectal sphincter tone is good. Prostate is smooth contracted without evidence of nodularity or mass. Sulcus is preserved bilaterally. No discrete nodularity is identified. No other rectal abnormalities are noted. Well-developed well-nourished patient in NAD. HEENT reveals PERLA, EOMI, discs not visualized.  Oral cavity is clear. No oral mucosal lesions are identified. Neck is clear without evidence of cervical or supraclavicular adenopathy. Lungs are clear to A&P. Cardiac examination is essentially unremarkable with regular rate and rhythm without murmur rub or thrill. Abdomen is benign with no organomegaly or masses noted. Motor sensory and DTR levels are equal and symmetric in the upper and lower extremities. Cranial nerves II through XII are grossly intact. Proprioception is intact. No peripheral adenopathy or edema is identified. No motor or sensory levels are noted. Crude visual fields are within normal range.  RADIOLOGY RESULTS: No current films for review  PLAN: At the present time he is doing well. I would support his PSA when available. I'm  please was overall progress. I've asked to see him back in 6 months he'll have a PSA drawn prior to his next visit. Patient knows to call sooner with any concerns.  I would like to take this opportunity to thank you for allowing me to participate in the care of your patient.Armstead Peaks., MD

## 2016-05-15 DIAGNOSIS — Z01818 Encounter for other preprocedural examination: Secondary | ICD-10-CM | POA: Diagnosis not present

## 2016-05-15 DIAGNOSIS — I6523 Occlusion and stenosis of bilateral carotid arteries: Secondary | ICD-10-CM | POA: Diagnosis not present

## 2016-05-15 DIAGNOSIS — R0602 Shortness of breath: Secondary | ICD-10-CM | POA: Diagnosis not present

## 2016-05-15 DIAGNOSIS — Z720 Tobacco use: Secondary | ICD-10-CM | POA: Diagnosis not present

## 2016-05-15 DIAGNOSIS — I700 Atherosclerosis of aorta: Secondary | ICD-10-CM | POA: Diagnosis not present

## 2016-05-20 DIAGNOSIS — I200 Unstable angina: Secondary | ICD-10-CM | POA: Diagnosis present

## 2016-05-23 ENCOUNTER — Encounter: Payer: Self-pay | Admitting: *Deleted

## 2016-05-23 ENCOUNTER — Encounter
Admission: RE | Disposition: A | Discharge status: Home / Self Care | Payer: Self-pay | Source: Ambulatory Visit | Attending: Cardiology

## 2016-05-23 ENCOUNTER — Ambulatory Visit
Admission: RE | Admit: 2016-05-23 | Discharge: 2016-05-24 | Disposition: A | Discharge status: Home / Self Care | Payer: Commercial Managed Care - HMO | Source: Ambulatory Visit | Attending: Cardiology | Admitting: Cardiology

## 2016-05-23 ENCOUNTER — Other Ambulatory Visit: Payer: Self-pay

## 2016-05-23 DIAGNOSIS — I25110 Atherosclerotic heart disease of native coronary artery with unstable angina pectoris: Secondary | ICD-10-CM | POA: Diagnosis not present

## 2016-05-23 DIAGNOSIS — E785 Hyperlipidemia, unspecified: Secondary | ICD-10-CM | POA: Diagnosis not present

## 2016-05-23 DIAGNOSIS — J449 Chronic obstructive pulmonary disease, unspecified: Secondary | ICD-10-CM | POA: Insufficient documentation

## 2016-05-23 DIAGNOSIS — Z79899 Other long term (current) drug therapy: Secondary | ICD-10-CM | POA: Insufficient documentation

## 2016-05-23 DIAGNOSIS — I10 Essential (primary) hypertension: Secondary | ICD-10-CM | POA: Insufficient documentation

## 2016-05-23 DIAGNOSIS — I25119 Atherosclerotic heart disease of native coronary artery with unspecified angina pectoris: Secondary | ICD-10-CM | POA: Diagnosis not present

## 2016-05-23 DIAGNOSIS — F17200 Nicotine dependence, unspecified, uncomplicated: Secondary | ICD-10-CM | POA: Insufficient documentation

## 2016-05-23 DIAGNOSIS — R079 Chest pain, unspecified: Secondary | ICD-10-CM | POA: Diagnosis not present

## 2016-05-23 DIAGNOSIS — I200 Unstable angina: Secondary | ICD-10-CM | POA: Diagnosis present

## 2016-05-23 DIAGNOSIS — E7800 Pure hypercholesterolemia, unspecified: Secondary | ICD-10-CM | POA: Insufficient documentation

## 2016-05-23 DIAGNOSIS — I2510 Atherosclerotic heart disease of native coronary artery without angina pectoris: Secondary | ICD-10-CM | POA: Diagnosis present

## 2016-05-23 DIAGNOSIS — G4730 Sleep apnea, unspecified: Secondary | ICD-10-CM | POA: Diagnosis not present

## 2016-05-23 HISTORY — PX: CARDIAC CATHETERIZATION: SHX172

## 2016-05-23 SURGERY — LEFT HEART CATH AND CORONARY ANGIOGRAPHY
Anesthesia: Moderate Sedation

## 2016-05-23 MED ORDER — SODIUM CHLORIDE 0.9 % IV SOLN: 250.0000 mL | INTRAVENOUS | Status: DC | PRN

## 2016-05-23 MED ORDER — TIOTROPIUM BROMIDE MONOHYDRATE 18 MCG IN CAPS
18.0000 ug | ORAL_CAPSULE | Freq: Every day | RESPIRATORY_TRACT | Status: DC
  Filled 2016-05-23: qty 5

## 2016-05-23 MED ORDER — ONDANSETRON HCL 4 MG/2ML IJ SOLN: 4.0000 mg | Freq: Four times a day (QID) | INTRAMUSCULAR | Status: DC | PRN

## 2016-05-23 MED ORDER — CLOPIDOGREL BISULFATE 75 MG PO TABS
75.0000 mg | ORAL_TABLET | Freq: Every day | ORAL | Status: DC
  Administered 2016-05-24: 75 mg via ORAL
  Filled 2016-05-23: qty 1

## 2016-05-23 MED ORDER — AMLODIPINE BESYLATE 5 MG PO TABS
5.0000 mg | ORAL_TABLET | Freq: Every day | ORAL | Status: DC
  Administered 2016-05-24: 5 mg via ORAL
  Filled 2016-05-23: qty 1

## 2016-05-23 MED ORDER — ACETAMINOPHEN 325 MG PO TABS: 650.0000 mg | ORAL_TABLET | ORAL | Status: DC | PRN

## 2016-05-23 MED ORDER — NITROGLYCERIN 5 MG/ML IV SOLN
INTRAVENOUS | Status: AC
  Filled 2016-05-23: qty 10

## 2016-05-23 MED ORDER — SODIUM CHLORIDE 0.9 % WEIGHT BASED INFUSION: 1.0000 mL/kg/h | INTRAVENOUS | Status: DC

## 2016-05-23 MED ORDER — FENTANYL CITRATE (PF) 100 MCG/2ML IJ SOLN
INTRAMUSCULAR | Status: DC | PRN
  Administered 2016-05-23: 25 ug via INTRAVENOUS

## 2016-05-23 MED ORDER — ASPIRIN 81 MG PO CHEW
CHEWABLE_TABLET | ORAL | Status: AC
  Filled 2016-05-23: qty 1

## 2016-05-23 MED ORDER — CLOPIDOGREL BISULFATE 75 MG PO TABS
ORAL_TABLET | ORAL | Status: DC | PRN
  Administered 2016-05-23: 600 mg via ORAL

## 2016-05-23 MED ORDER — HYDROCHLOROTHIAZIDE 12.5 MG PO CAPS
12.5000 mg | ORAL_CAPSULE | Freq: Every day | ORAL | Status: DC
  Administered 2016-05-24: 12.5 mg via ORAL
  Filled 2016-05-23: qty 1

## 2016-05-23 MED ORDER — SODIUM CHLORIDE 0.9% FLUSH: 3.0000 mL | INTRAVENOUS | Status: DC | PRN

## 2016-05-23 MED ORDER — ASPIRIN EC 325 MG PO TBEC
325.0000 mg | DELAYED_RELEASE_TABLET | Freq: Every day | ORAL | Status: DC
  Administered 2016-05-23 – 2016-05-24 (×2): 325 mg via ORAL
  Filled 2016-05-23: qty 1

## 2016-05-23 MED ORDER — SODIUM CHLORIDE 0.9 % WEIGHT BASED INFUSION
3.0000 mL/kg/h | INTRAVENOUS | Status: DC
  Administered 2016-05-23: 3 mL/kg/h via INTRAVENOUS

## 2016-05-23 MED ORDER — ASPIRIN 81 MG PO CHEW
CHEWABLE_TABLET | ORAL | Status: AC
  Filled 2016-05-23: qty 4

## 2016-05-23 MED ORDER — IOPAMIDOL (ISOVUE-300) INJECTION 61%
INTRAVENOUS | Status: DC | PRN
  Administered 2016-05-23: 75 mL via INTRA_ARTERIAL

## 2016-05-23 MED ORDER — MIDAZOLAM HCL 2 MG/2ML IJ SOLN
INTRAMUSCULAR | Status: DC | PRN
  Administered 2016-05-23: 1 mg via INTRAVENOUS

## 2016-05-23 MED ORDER — ASPIRIN 81 MG PO CHEW
CHEWABLE_TABLET | ORAL | Status: DC | PRN
  Administered 2016-05-23: 324 mg via ORAL

## 2016-05-23 MED ORDER — NITROGLYCERIN 1 MG/10 ML FOR IR/CATH LAB
INTRA_ARTERIAL | Status: DC | PRN
  Administered 2016-05-23 (×2): 200 ug

## 2016-05-23 MED ORDER — SODIUM CHLORIDE 0.9% FLUSH
3.0000 mL | Freq: Two times a day (BID) | INTRAVENOUS | Status: DC
  Administered 2016-05-23: 3 mL via INTRAVENOUS

## 2016-05-23 MED ORDER — SODIUM CHLORIDE 0.9 % IV SOLN
INTRAVENOUS | Status: DC | PRN
  Administered 2016-05-23: 1.75 mg/kg/h via INTRAVENOUS

## 2016-05-23 MED ORDER — BIVALIRUDIN 250 MG IV SOLR
INTRAVENOUS | Status: AC
  Filled 2016-05-23: qty 250

## 2016-05-23 MED ORDER — IOPAMIDOL (ISOVUE-300) INJECTION 61%
INTRAVENOUS | Status: DC | PRN
  Administered 2016-05-23: 130 mL via INTRA_ARTERIAL

## 2016-05-23 MED ORDER — FENTANYL CITRATE (PF) 100 MCG/2ML IJ SOLN
INTRAMUSCULAR | Status: AC
  Filled 2016-05-23: qty 2

## 2016-05-23 MED ORDER — SODIUM CHLORIDE 0.9% FLUSH: 3.0000 mL | Freq: Two times a day (BID) | INTRAVENOUS | Status: DC

## 2016-05-23 MED ORDER — SODIUM CHLORIDE 0.9 % WEIGHT BASED INFUSION
3.0000 mL/kg/h | INTRAVENOUS | Status: AC
  Administered 2016-05-23: 3 mL/kg/h via INTRAVENOUS

## 2016-05-23 MED ORDER — HEPARIN (PORCINE) IN NACL 2-0.9 UNIT/ML-% IJ SOLN
INTRAMUSCULAR | Status: AC
  Filled 2016-05-23: qty 1000

## 2016-05-23 MED ORDER — LISINOPRIL 10 MG PO TABS
20.0000 mg | ORAL_TABLET | Freq: Every day | ORAL | Status: DC
  Administered 2016-05-24: 20 mg via ORAL
  Filled 2016-05-23: qty 2

## 2016-05-23 MED ORDER — CLOPIDOGREL BISULFATE 75 MG PO TABS
ORAL_TABLET | ORAL | Status: AC
  Filled 2016-05-23: qty 8

## 2016-05-23 MED ORDER — MIDAZOLAM HCL 2 MG/2ML IJ SOLN
INTRAMUSCULAR | Status: AC
  Filled 2016-05-23: qty 2

## 2016-05-23 MED ORDER — BIVALIRUDIN BOLUS VIA INFUSION - CUPID
INTRAVENOUS | Status: DC | PRN
  Administered 2016-05-23: 76.575 mg via INTRAVENOUS

## 2016-05-23 MED ORDER — ASPIRIN 81 MG PO CHEW
81.0000 mg | CHEWABLE_TABLET | ORAL | Status: AC
  Administered 2016-05-23: 81 mg via ORAL

## 2016-05-23 SURGICAL SUPPLY — 15 items
CATH INFINITI 5FR ANG PIGTAIL (CATHETERS) ×4 IMPLANT
CATH INFINITI 5FR JL4 (CATHETERS) ×4 IMPLANT
CATH INFINITI JR4 5F (CATHETERS) ×4 IMPLANT
CATH VISTA GUIDE 6FR XB3.5 (CATHETERS) ×4 IMPLANT
DEVICE CLOSURE MYNXGRIP 6/7F (Vascular Products) ×4 IMPLANT
DEVICE INFLAT 30 PLUS (MISCELLANEOUS) ×4 IMPLANT
DEVICE SAFEGUARD 24CM (GAUZE/BANDAGES/DRESSINGS) ×4 IMPLANT
KIT MANI 3VAL PERCEP (MISCELLANEOUS) ×4 IMPLANT
NEEDLE PERC 18GX7CM (NEEDLE) ×4 IMPLANT
PACK CARDIAC CATH (CUSTOM PROCEDURE TRAY) ×4 IMPLANT
SHEATH AVANTI 6FR X 11CM (SHEATH) ×4 IMPLANT
SHEATH PINNACLE 5F 10CM (SHEATH) ×4 IMPLANT
STENT XIENCE ALPINE RX 3.5X12 (Permanent Stent) ×4 IMPLANT
WIRE ASAHI PROWATER 180CM (WIRE) ×4 IMPLANT
WIRE EMERALD 3MM-J .035X150CM (WIRE) ×4 IMPLANT

## 2016-05-23 NOTE — Progress Notes (Signed)
Angiomax stopped. Report called to floor. Patient able to tolerate PO fluids. Patient moved to floor, RN Alana in room to greet patient. Right groin assessed. No complications noted.

## 2016-05-24 ENCOUNTER — Encounter: Payer: Self-pay | Admitting: Internal Medicine

## 2016-05-24 DIAGNOSIS — I25110 Atherosclerotic heart disease of native coronary artery with unstable angina pectoris: Secondary | ICD-10-CM | POA: Diagnosis not present

## 2016-05-24 DIAGNOSIS — J449 Chronic obstructive pulmonary disease, unspecified: Secondary | ICD-10-CM | POA: Diagnosis not present

## 2016-05-24 DIAGNOSIS — E7800 Pure hypercholesterolemia, unspecified: Secondary | ICD-10-CM | POA: Diagnosis not present

## 2016-05-24 DIAGNOSIS — I25119 Atherosclerotic heart disease of native coronary artery with unspecified angina pectoris: Secondary | ICD-10-CM | POA: Diagnosis not present

## 2016-05-24 DIAGNOSIS — Z79899 Other long term (current) drug therapy: Secondary | ICD-10-CM | POA: Diagnosis not present

## 2016-05-24 DIAGNOSIS — F17200 Nicotine dependence, unspecified, uncomplicated: Secondary | ICD-10-CM | POA: Diagnosis not present

## 2016-05-24 DIAGNOSIS — G4730 Sleep apnea, unspecified: Secondary | ICD-10-CM | POA: Diagnosis not present

## 2016-05-24 DIAGNOSIS — E785 Hyperlipidemia, unspecified: Secondary | ICD-10-CM | POA: Diagnosis not present

## 2016-05-24 DIAGNOSIS — I10 Essential (primary) hypertension: Secondary | ICD-10-CM | POA: Diagnosis not present

## 2016-05-24 LAB — BASIC METABOLIC PANEL
ANION GAP: 6 (ref 5–15)
BUN: 16 mg/dL (ref 6–20)
CALCIUM: 8.7 mg/dL — AB (ref 8.9–10.3)
CO2: 30 mmol/L (ref 22–32)
Chloride: 101 mmol/L (ref 101–111)
Creatinine, Ser: 0.77 mg/dL (ref 0.61–1.24)
Glucose, Bld: 97 mg/dL (ref 65–99)
POTASSIUM: 3.7 mmol/L (ref 3.5–5.1)
Sodium: 137 mmol/L (ref 135–145)

## 2016-05-24 LAB — CBC
HEMATOCRIT: 38.4 % — AB (ref 40.0–52.0)
HEMOGLOBIN: 13.4 g/dL (ref 13.0–18.0)
MCH: 31.7 pg (ref 26.0–34.0)
MCHC: 34.9 g/dL (ref 32.0–36.0)
MCV: 90.9 fL (ref 80.0–100.0)
Platelets: 147 10*3/uL — ABNORMAL LOW (ref 150–440)
RBC: 4.23 MIL/uL — AB (ref 4.40–5.90)
RDW: 13.2 % (ref 11.5–14.5)
WBC: 6.8 10*3/uL (ref 3.8–10.6)

## 2016-05-24 MED ORDER — ASPIRIN 325 MG PO TBEC: 325.0000 mg | DELAYED_RELEASE_TABLET | Freq: Every day | ORAL | 0 refills | Status: DC

## 2016-05-24 MED ORDER — CLOPIDOGREL BISULFATE 75 MG PO TABS: 75.0000 mg | ORAL_TABLET | Freq: Every day | ORAL | 4 refills | Status: DC

## 2016-05-24 NOTE — Progress Notes (Signed)
Right groin site has some slow oozing of bright red blood under PAD. Did not remove the PAD. 10cc remain in the PAD. Will reinsert 10cc and continue to monitor.

## 2016-05-24 NOTE — Discharge Summary (Signed)
[facility] Discharge Summary  Patient ID: Robert Powell MRN: [id number] DOB/AGE: August 21, 1951 65 y.o.  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  Hospital Course: The patient was admitted to specials for cardiac cath with selective coronary angiogram after full consent, risk and benefits explained, and time out called with all approprate details voiced and discussed. The patient has had progressive canadian class 4 angina with high probability risk stress test consistent with ischemic chest pain and or anginal equivalent with coronary artery risk factors including high blood pressure, high cholesterol and smoking. The procedure was performed without complication and it revealed normal left ventricular function with ejection fraction of 55%.  It was found that the patient had severe 2 vessel coronary atherosclerosis with significant left anterior descending and OM2 stenosis requiring further intervention. Therefore, the patient had a PCI and drug eluding stent placed without complication. The patient has been ambulating without further significant symptoms and has reached his maximal hospital benefit and will be discharged to home in good condition.  Cardiac rehabilitation has been discussed and recommended. Medication management of cardiovascular risk factors will be given post discharge and modified as an outpatient.   Discharge Exam: Blood pressure (!) 148/79, pulse 71, temperature 97.7 F (36.5 C), resp. rate 18, height 6' (1.829 m), weight 235 lb 7.2 oz (106.8 kg), SpO2 92 %.  Constitutional: [place] oriented to person, place, and time. No distress.   HENT: No nasal discharge.  Head: Normocephalic and atraumatic.  Eyes: Pupils are equal and round. No discharge.  Neck: Normal range of motion. Neck supple. No JVD present. No thyromegaly present.  Cardiovascular: Normal rate, regular rhythm, normal S1 S2, no gallop, no friction rub. No murmur Pulmonary/Chest: Effort normal, No stridor. No respiratory distress. no wheezes.  no rales.    Abdominal: Soft. Bowel sounds are normal.  no distension.  no tenderness. There is no rebound and no guarding.  Musculoskeletal: No edema, no cyanosis, normal pulses, no bleeding, Normal range of motion. no tenderness.  Neurological:  alert and oriented to person, place, and time. Coordination normal.  Skin: Skin is warm and dry. No rash noted. No erythema. No pallor.  Psychiatric:  normal mood and affect. behavior is normal.    Labs:   Lab Results  Component Value Date   WBC 6.8 05/24/2016   HGB 13.4 05/24/2016   HCT 38.4 (L) 05/24/2016   MCV 90.9 05/24/2016   PLT 147 (L) 05/24/2016    Recent Labs Lab 05/24/16 0428  NA 137  K 3.7  CL 101  CO2 30  BUN 16  CREATININE 0.77  CALCIUM 8.7*  GLUCOSE 97    EKG: NSR without evidence of new changes  FOLLOW UP IN ONE TO TWO WEEKS    Medication List    TAKE these medications   amLODipine 5 MG tablet Commonly known as:  NORVASC Take by mouth.   aspirin 325 MG EC tablet Take 1 tablet (325 mg total) by mouth daily.   calcium-vitamin D 500-200 MG-UNIT tablet Take by mouth.   clopidogrel 75 MG tablet Commonly known as:  PLAVIX Take 1 tablet (75 mg total) by mouth daily with breakfast.   docusate sodium 100 MG capsule Commonly known as:  COLACE  Take 1 capsule (100 mg total) by mouth 2 (two) times daily.   ipratropium-albuterol 0.5-2.5 (3) MG/3ML Soln Commonly known as:  DUONEB   lisinopril-hydrochlorothiazide 20-12.5 MG tablet Commonly known as:  PRINZIDE,ZESTORETIC Take by mouth.   tamsulosin 0.4 MG Caps capsule Commonly known  as:  FLOMAX Take 1 capsule (0.4 mg total) by mouth daily.   tiotropium 18 MCG inhalation capsule Commonly known as:  [place] into inhaler and inhale.   VENTOLIN HFA 108 (90 Base) MCG/ACT inhaler Generic drug:  albuterol Inhale into the lungs.        THE PATIENT  SHALL BRING ALL MEDICATIONS TO FOLLOW UP APPOINTMENT  Signed:  Corey Skains MD, [facility] 05/24/2016, 8:25 AM

## 2016-05-24 NOTE — Progress Notes (Signed)
Pt to be discharged today. Ambulated at length in hall. tol well.iv 's and tele removed. disch instructions and prescrip given to pt and wife to their understanding. disch via w.c. Accompanied by wife.

## 2016-05-31 DIAGNOSIS — R0602 Shortness of breath: Secondary | ICD-10-CM | POA: Diagnosis not present

## 2016-05-31 DIAGNOSIS — I25118 Atherosclerotic heart disease of native coronary artery with other forms of angina pectoris: Secondary | ICD-10-CM | POA: Diagnosis not present

## 2016-05-31 DIAGNOSIS — I6523 Occlusion and stenosis of bilateral carotid arteries: Secondary | ICD-10-CM | POA: Diagnosis not present

## 2016-05-31 DIAGNOSIS — I10 Essential (primary) hypertension: Secondary | ICD-10-CM | POA: Diagnosis not present

## 2016-06-26 DIAGNOSIS — E782 Mixed hyperlipidemia: Secondary | ICD-10-CM | POA: Diagnosis not present

## 2016-06-26 DIAGNOSIS — Z720 Tobacco use: Secondary | ICD-10-CM | POA: Diagnosis not present

## 2016-06-26 DIAGNOSIS — I25118 Atherosclerotic heart disease of native coronary artery with other forms of angina pectoris: Secondary | ICD-10-CM | POA: Diagnosis not present

## 2016-06-26 DIAGNOSIS — I10 Essential (primary) hypertension: Secondary | ICD-10-CM | POA: Diagnosis not present

## 2016-07-11 DIAGNOSIS — F17200 Nicotine dependence, unspecified, uncomplicated: Secondary | ICD-10-CM | POA: Diagnosis not present

## 2016-07-11 DIAGNOSIS — Z23 Encounter for immunization: Secondary | ICD-10-CM | POA: Diagnosis not present

## 2016-07-11 DIAGNOSIS — G4733 Obstructive sleep apnea (adult) (pediatric): Secondary | ICD-10-CM | POA: Diagnosis not present

## 2016-07-11 DIAGNOSIS — I10 Essential (primary) hypertension: Secondary | ICD-10-CM | POA: Diagnosis not present

## 2016-07-11 DIAGNOSIS — J449 Chronic obstructive pulmonary disease, unspecified: Secondary | ICD-10-CM | POA: Diagnosis not present

## 2016-07-11 DIAGNOSIS — Z9989 Dependence on other enabling machines and devices: Secondary | ICD-10-CM | POA: Diagnosis not present

## 2016-07-11 DIAGNOSIS — F1020 Alcohol dependence, uncomplicated: Secondary | ICD-10-CM | POA: Diagnosis not present

## 2016-09-10 DIAGNOSIS — I2510 Atherosclerotic heart disease of native coronary artery without angina pectoris: Secondary | ICD-10-CM | POA: Diagnosis not present

## 2016-09-10 DIAGNOSIS — Z720 Tobacco use: Secondary | ICD-10-CM | POA: Diagnosis not present

## 2016-09-10 DIAGNOSIS — I6523 Occlusion and stenosis of bilateral carotid arteries: Secondary | ICD-10-CM | POA: Diagnosis not present

## 2016-09-10 DIAGNOSIS — I10 Essential (primary) hypertension: Secondary | ICD-10-CM | POA: Diagnosis not present

## 2016-09-10 DIAGNOSIS — I700 Atherosclerosis of aorta: Secondary | ICD-10-CM | POA: Diagnosis not present

## 2016-09-25 ENCOUNTER — Ambulatory Visit: Payer: Medicare HMO | Admitting: Urology

## 2016-09-27 ENCOUNTER — Ambulatory Visit: Payer: Self-pay | Admitting: Urology

## 2016-10-08 DIAGNOSIS — I10 Essential (primary) hypertension: Secondary | ICD-10-CM | POA: Diagnosis not present

## 2016-10-09 ENCOUNTER — Inpatient Hospital Stay: Payer: Medicare HMO | Attending: Radiation Oncology

## 2016-10-09 DIAGNOSIS — C61 Malignant neoplasm of prostate: Secondary | ICD-10-CM

## 2016-10-09 LAB — PSA: PSA: 0.05 ng/mL (ref 0.00–4.00)

## 2016-10-15 DIAGNOSIS — I10 Essential (primary) hypertension: Secondary | ICD-10-CM | POA: Diagnosis not present

## 2016-10-15 DIAGNOSIS — E7800 Pure hypercholesterolemia, unspecified: Secondary | ICD-10-CM | POA: Diagnosis not present

## 2016-10-15 DIAGNOSIS — Z9861 Coronary angioplasty status: Secondary | ICD-10-CM | POA: Diagnosis not present

## 2016-10-15 DIAGNOSIS — I2510 Atherosclerotic heart disease of native coronary artery without angina pectoris: Secondary | ICD-10-CM | POA: Diagnosis not present

## 2016-10-15 DIAGNOSIS — F17200 Nicotine dependence, unspecified, uncomplicated: Secondary | ICD-10-CM | POA: Diagnosis not present

## 2016-10-15 DIAGNOSIS — J449 Chronic obstructive pulmonary disease, unspecified: Secondary | ICD-10-CM | POA: Diagnosis not present

## 2016-10-16 ENCOUNTER — Encounter: Payer: Self-pay | Admitting: Radiation Oncology

## 2016-10-16 ENCOUNTER — Ambulatory Visit
Admission: RE | Admit: 2016-10-16 | Discharge: 2016-10-16 | Disposition: A | Discharge status: Home / Self Care | Payer: Medicare HMO | Source: Ambulatory Visit | Attending: Radiation Oncology | Admitting: Radiation Oncology

## 2016-10-16 VITALS — BP 126/72 | HR 81 | Temp 98.1°F | Resp 18 | Ht 72.0 in | Wt 239.4 lb

## 2016-10-16 DIAGNOSIS — C61 Malignant neoplasm of prostate: Secondary | ICD-10-CM

## 2016-10-16 NOTE — Progress Notes (Signed)
Radiation Oncology Follow up Note  Name: Robert Powell   Date:   10/16/2016 MRN:  [id number] DOB: Jan 13, 1951    This 66 y.o. male presents to the clinic today for follow-up liver months out status post I-125 interstitial implant.  REFERRING PROVIDER: Glendon Axe, MD  HPI: Patient is a 66 year old male now out 11 months having completed I-125 interstitial implant for Gleason 6 adenocarcinoma the prostate. He is seen today in routine follow-up is doing well specifically denies diarrhea dysuria or any other GI/GU complaints. His recent PSA was 0.05.Marland Kitchen  COMPLICATIONS OF TREATMENT: none  FOLLOW UP COMPLIANCE: keeps appointments   PHYSICAL EXAM:  BP 126/72   Pulse 81   Temp 98.1 F (36.7 C)   Resp 18   Ht 6' (1.829 m)   Wt 239 lb 6.7 oz (108.6 kg)   BMI 32.47 kg/m  On rectal exam rectal sphincter tone is good. Prostate is smooth contracted without evidence of nodularity or mass. Sulcus is preserved bilaterally. No discrete nodularity is identified. No other rectal abnormalities are noted. Well-developed well-nourished patient in NAD. HEENT reveals PERLA, EOMI, discs not visualized.  Oral cavity is clear. No oral mucosal lesions are identified. Neck is clear without evidence of cervical or supraclavicular adenopathy. Lungs are clear to A&P. Cardiac examination is essentially unremarkable with regular rate and rhythm without murmur rub or thrill. Abdomen is benign with no organomegaly or masses noted. Motor sensory and DTR levels are equal and symmetric in the upper and lower extremities. Cranial nerves II through XII are grossly intact. Proprioception is intact. No peripheral adenopathy or edema is identified. No motor or sensory levels are noted. Crude visual fields are within normal range.  RADIOLOGY RESULTS: No current films for review  PLAN: , At the present time patient is doing well under excellent biochemical control of his prostate cancer. I've asked to see him back in 1 year for  follow-up will obtain a PSA prior to that visit. Patient is to call at anytime with any concerns. I'm please was overall progress and response.  I would like to take this opportunity to thank you for allowing me to participate in the care of your patient.Armstead Peaks., MD

## 2016-11-08 ENCOUNTER — Encounter: Payer: Self-pay | Admitting: Urology

## 2016-11-08 ENCOUNTER — Ambulatory Visit (INDEPENDENT_AMBULATORY_CARE_PROVIDER_SITE_OTHER): Payer: Medicare HMO | Admitting: Urology

## 2016-11-08 VITALS — BP 118/69 | HR 81 | Ht 72.0 in | Wt 235.0 lb

## 2016-11-08 DIAGNOSIS — C61 Malignant neoplasm of prostate: Secondary | ICD-10-CM | POA: Diagnosis not present

## 2016-11-08 NOTE — Progress Notes (Signed)
11/08/16  Marland Kitchen 1950/09/18 [id number]  Referring provider: Glendon Axe, MD [facility] [place], [place] [address]  Chief Complaint  Patient presents with  . Prostate Cancer    10month    HPI: 66 year old male diagnosed with primarily Gleason 3+3 and single core of 3+4 prostate cancer in 04/2015 involving 7/12 cores bilaterally with largest burden involving 100% of the left lateral core with Gleason 3+4 (only 5% or less of 4 component).  PSA 6.2 at the time of dx. 35 cc prostate with firm nodule of the left mid gland.  TRUS ultrasound with diffuse prostatic calcifications and hypoechoic area at site of nodule.     CT abd/ pelvis showed multiple lytic lesions in L2 L3 L4 vertebral bodies.  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 There is no obvious pelvic lymphadenopathy.   As part of follow-up, he underwent bone scan which was negative for any evidence of metastatic disease. He also had chest imaging given his history of extensive smoking which showed a very small stable pulmonary nodule. Follow up MRI today at [facility] of the spine which shows generative disc disease and no evidence of metastatic bony disease.  He elected to undergo brachytherapy seed placement for treatment of his prostate cancer with myself and Dr. Baruch Gouty on 11/20/15.  He tolerated the procedure well and has no complaints today. He has no voiding complaints and has stopped flomax.  He completed 6 months Lupron.    Today, he is sleeping in clinic 2/2 to narcolepsy and unable to provided additional history.  He did briefly wake up at the end of the visit and reports that he is urinating well. He has no dysuria, gross hematuria, urinary frequency or urgency.  PMH: Past Medical History:  Diagnosis Date  . AA (alcohol abuse) 02/28/2014  . Adenocarcinoma of prostate ([place]) 05/22/2015  . Alcohol abuse   . Apnea, sleep 02/28/2014  . Benign essential HTN 02/17/2015  . BPH  with obstruction/lower urinary tract symptoms 04/12/2015  . Cancer ([place])   . Cellulitis 09/21/2014  . Chronic obstructive pulmonary disease ([place]) 04/28/2014  . 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 COPD (chronic obstructive pulmonary disease) ([place])   . Elevated prostate specific antigen (PSA) 02/16/2015  . Elevated PSA    6.7 on 02/16/2015  . Essential (primary) hypertension 02/18/2015  . Genital herpes   . Hyperlipidemia   . Hypertension   . Lesion of external ear 04/28/2014  . Narcolepsy   . Rhinitis   . Sleep apnea    histort had surgery better  . Tobacco abuse     Surgical History: Past Surgical History:  Procedure Laterality Date  . CARDIAC CATHETERIZATION Left 05/23/2016   Procedure: Left Heart Cath and Coronary Angiography;  Surgeon: Corey Skains, MD;  Location: [place];  Service: Cardiovascular;  Laterality: Left;  . CARDIAC CATHETERIZATION N/A 05/23/2016   Procedure: Coronary Stent Intervention;  Surgeon: Isaias Cowman, MD;  Location: [employer] [facility];  Service: Cardiovascular;  Laterality: N/A;  . RADIOACTIVE SEED IMPLANT N/A 11/20/2015   Procedure: RADIOACTIVE SEED IMPLANT/BRACHYTHERAPY IMPLANT;  Surgeon: Hollice Espy, MD;  Location: [facility] ORS;  Service: Urology;  Laterality: N/A;  . TONSILLECTOMY      Home Medications:  Allergies as of 11/08/2016   No Known Allergies     Medication List       Accurate as of 11/08/16  3:19 PM. Always use your most  recent med list.          amLODipine 5 MG tablet Commonly known as:  NORVASC Take by mouth.   aspirin 325 MG EC tablet Take 1 tablet (325 mg total) by mouth daily.   Budesonide 90 MCG/ACT inhaler Inhale into the lungs.   calcium-vitamin D 500-200 MG-UNIT tablet Take by mouth.   clopidogrel 75 MG tablet Commonly known as:  PLAVIX Take 1 tablet (75 mg total) by mouth daily with breakfast.   docusate sodium 100 MG capsule Commonly known as:  COLACE Take 1 capsule  (100 mg total) by mouth 2 (two) times daily.   ipratropium-albuterol 0.5-2.5 (3) MG/3ML Soln Commonly known as:  DUONEB   isosorbide mononitrate 30 MG 24 hr tablet Commonly known as:  IMDUR Take by mouth.   lisinopril-hydrochlorothiazide 20-12.5 MG tablet Commonly known as:  PRINZIDE,ZESTORETIC Take by mouth.   rosuvastatin 10 MG tablet Commonly known as:  CRESTOR Take by mouth.   tamsulosin 0.4 MG Caps capsule Commonly known as:  FLOMAX Take 1 capsule (0.4 mg total) by mouth daily.   tiotropium 18 MCG inhalation capsule Commonly known as:  [place] into inhaler and inhale.   VENTOLIN HFA 108 (90 Base) MCG/ACT inhaler Generic drug:  albuterol Inhale into the lungs.       Allergies: No Known Allergies  Family History: Family History  Problem Relation Age of Onset  . Heart attack Mother   . Heart disease Mother   . Heart attack Father   . Kidney disease Neg Hx   . Prostate cancer Neg Hx     Social History:  reports that he has been smoking.  He has been smoking about 1.50 packs per day. He has never used smokeless tobacco. He reports that he drinks about 4.8 oz of alcohol per week . He reports that he does not use drugs.  ROS UROLOGY Frequent Urination?: No Hard to postpone urination?: No Burning/pain with urination?: No Get up at night to urinate?: No Leakage of urine?: No Urine stream starts and stops?: No Trouble starting stream?: No Do you have to strain to urinate?: No Blood in urine?: No Urinary tract infection?: No Sexually transmitted disease?: No Injury to kidneys or bladder?: No Painful intercourse?: No Weak stream?: No Erection problems?: No Penile pain?: No Gastrointestinal Nausea?: No Vomiting?: No Indigestion/heartburn?: No Diarrhea?: No Constipation?: No Constitutional Fever: No Night sweats?: No Weight loss?: No Fatigue?: No Skin Skin rash/lesions?: No Itching?: No Eyes Blurred vision?: No Double vision?:  No Ears/Nose/Throat Sore throat?: No Sinus problems?: No Hematologic/Lymphatic Swollen glands?: No Easy bruising?: Yes Cardiovascular Leg swelling?: No Chest pain?: No Respiratory Cough?: Yes Shortness of breath?: Yes Endocrine Excessive thirst?: No Musculoskeletal Back pain?: No Joint pain?: No Neurological Headaches?: No Dizziness?: No Psychologic Depression?: No Anxiety?: No   Physical Exam: BP 118/69   Pulse 81   Ht 6' (1.829 m)   Wt 235 lb (106.6 kg)   BMI 31.87 kg/m   Constitutional:  Alert and oriented, No acute distress.   HEENT: [place] AT, moist mucus membranes.  Trachea midline, no masses.  Slightly erythematous sclera. Cardiovascular: No clubbing, cyanosis, or edema. Respiratory: Normal respiratory effort, no increased work of breathing.. Abd: soft, NT, ND Skin: No rashes, bruises or suspicious lesions.  Facial telangiectasia. Neurologic: Grossly intact, no focal deficits, moving all 4 extremities.  On the rest at times throughout the visit. Finally, awoken and was appropriate. Psychiatric: Normal mood and affect.  Laboratory Data: CBC  Component Value Date/Time   WBC 6.8 05/24/2016 0428   RBC 4.23 (L) 05/24/2016 0428   HGB 13.4 05/24/2016 0428   HCT 38.4 (L) 05/24/2016 0428   PLT 147 (L) 05/24/2016 0428   MCV 90.9 05/24/2016 0428   MCH 31.7 05/24/2016 0428   MCHC 34.9 05/24/2016 0428   RDW 13.2 05/24/2016 0428   LYMPHSABS 1.3 08/22/2015 1510   MONOABS 0.6 08/22/2015 1510   EOSABS 0.2 08/22/2015 1510   BASOSABS 0.0 08/22/2015 1510     Lab Results  Component Value Date   PSA 0.05 10/09/2016   PSA 0.01 05/09/2016   PSA 7.78 (H) 08/28/2015    Assessment & Plan:    1. Prostate cancer (H  T2aN0M0 prostate cancer s/p brachytherapy I-125 seed placement 11/2014.   S/p Lupron x 6 months completed PSA 0.5, possibly nadir PSA recheck in 6 months   Return in about 6 months (around 05/11/2017) for PSA/ DRE.   Hollice Espy, MD  [facility] [address], [place], [place] [address] [phone]

## 2016-11-13 ENCOUNTER — Telehealth: Payer: Self-pay

## 2016-11-13 NOTE — Telephone Encounter (Signed)
Pt called wanting to know if he needs to continue taking calcium and iron supplements. Please advise.

## 2016-11-14 NOTE — Telephone Encounter (Signed)
Probably not a bad idea for a little while.  Hollice Espy, MD

## 2016-11-14 NOTE — Telephone Encounter (Signed)
Spoke with pt in reference to taking supplements. Reinforced with pt to continue supplements for now. Pt voiced understanding.

## 2017-01-14 DIAGNOSIS — J449 Chronic obstructive pulmonary disease, unspecified: Secondary | ICD-10-CM | POA: Diagnosis not present

## 2017-01-14 DIAGNOSIS — Z125 Encounter for screening for malignant neoplasm of prostate: Secondary | ICD-10-CM | POA: Diagnosis not present

## 2017-01-14 DIAGNOSIS — I10 Essential (primary) hypertension: Secondary | ICD-10-CM | POA: Diagnosis not present

## 2017-01-14 DIAGNOSIS — E7800 Pure hypercholesterolemia, unspecified: Secondary | ICD-10-CM | POA: Diagnosis not present

## 2017-02-10 DIAGNOSIS — I10 Essential (primary) hypertension: Secondary | ICD-10-CM | POA: Diagnosis not present

## 2017-02-10 DIAGNOSIS — I2510 Atherosclerotic heart disease of native coronary artery without angina pectoris: Secondary | ICD-10-CM | POA: Diagnosis not present

## 2017-02-10 DIAGNOSIS — Z9861 Coronary angioplasty status: Secondary | ICD-10-CM | POA: Diagnosis not present

## 2017-02-10 DIAGNOSIS — E782 Mixed hyperlipidemia: Secondary | ICD-10-CM | POA: Diagnosis not present

## 2017-02-10 DIAGNOSIS — I700 Atherosclerosis of aorta: Secondary | ICD-10-CM | POA: Diagnosis not present

## 2017-02-10 DIAGNOSIS — G4733 Obstructive sleep apnea (adult) (pediatric): Secondary | ICD-10-CM | POA: Diagnosis not present

## 2017-02-10 DIAGNOSIS — I6523 Occlusion and stenosis of bilateral carotid arteries: Secondary | ICD-10-CM | POA: Diagnosis not present

## 2017-02-10 DIAGNOSIS — Z720 Tobacco use: Secondary | ICD-10-CM | POA: Diagnosis not present

## 2017-02-10 DIAGNOSIS — F17200 Nicotine dependence, unspecified, uncomplicated: Secondary | ICD-10-CM | POA: Diagnosis not present

## 2017-04-09 DIAGNOSIS — Z125 Encounter for screening for malignant neoplasm of prostate: Secondary | ICD-10-CM | POA: Diagnosis not present

## 2017-04-09 DIAGNOSIS — I10 Essential (primary) hypertension: Secondary | ICD-10-CM | POA: Diagnosis not present

## 2017-04-16 DIAGNOSIS — E871 Hypo-osmolality and hyponatremia: Secondary | ICD-10-CM | POA: Insufficient documentation

## 2017-04-16 DIAGNOSIS — Z0000 Encounter for general adult medical examination without abnormal findings: Secondary | ICD-10-CM | POA: Diagnosis not present

## 2017-04-16 DIAGNOSIS — Z23 Encounter for immunization: Secondary | ICD-10-CM | POA: Diagnosis not present

## 2017-05-15 ENCOUNTER — Other Ambulatory Visit: Payer: Self-pay | Admitting: *Deleted

## 2017-05-15 DIAGNOSIS — Z8546 Personal history of malignant neoplasm of prostate: Secondary | ICD-10-CM

## 2017-05-16 ENCOUNTER — Ambulatory Visit (INDEPENDENT_AMBULATORY_CARE_PROVIDER_SITE_OTHER): Payer: Medicare HMO | Admitting: Urology

## 2017-05-16 ENCOUNTER — Encounter: Payer: Self-pay | Admitting: Urology

## 2017-05-16 ENCOUNTER — Other Ambulatory Visit
Admission: RE | Admit: 2017-05-16 | Discharge: 2017-05-16 | Disposition: A | Discharge status: Home / Self Care | Payer: Medicare HMO | Source: Ambulatory Visit | Attending: Urology | Admitting: Urology

## 2017-05-16 VITALS — BP 128/76 | HR 76 | Ht 72.0 in | Wt 216.0 lb

## 2017-05-16 DIAGNOSIS — Z8546 Personal history of malignant neoplasm of prostate: Secondary | ICD-10-CM

## 2017-05-16 DIAGNOSIS — F17200 Nicotine dependence, unspecified, uncomplicated: Secondary | ICD-10-CM | POA: Diagnosis not present

## 2017-05-16 DIAGNOSIS — C61 Malignant neoplasm of prostate: Secondary | ICD-10-CM | POA: Diagnosis not present

## 2017-05-16 NOTE — Progress Notes (Signed)
05/16/17  Robert Powell 1951/07/06 [id number]  Referring provider: Glendon Axe, MD [facility] [place], [place] [address]  Chief Complaint  Patient presents with  . Prostate Cancer    6 month follow up     HPI: 66 year old male diagnosed with primarily Gleason 3+3 and single core of 3+4 prostate cancer in 04/2015 involving 7/12 cores bilaterally with largest burden involving 100% of the left lateral core with Gleason 3+4 (only 5% or less of 4 component).  PSA 6.2 at the time of dx. 35 cc prostate with firm nodule of the left mid gland.  TRUS ultrasound with diffuse prostatic calcifications and hypoechoic area at site of nodule.     CT abd/ pelvis showed multiple lytic lesions in L2 L3 L4 vertebral bodies.  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 There is no obvious pelvic lymphadenopathy.   As part of follow-up, he underwent bone scan which was negative for any evidence of metastatic disease. He also had chest imaging given his history of extensive smoking which showed a very small stable pulmonary nodule. Follow up MRI today at [facility] of the spine which shows generative disc disease and no evidence of metastatic bony disease.  He elected to undergo brachytherapy seed placement for treatment of his prostate cancer with myself and Dr. Baruch Gouty on 11/20/15.  He tolerated the procedure well and has no complaints today. He has no voiding complaints and has stopped flomax.  He completed 6 months Lupron.    PSA remain stably low, 0.05 which appears to be his nadir.  Today, he has no urinary symptoms. No urinary frequency or urgency.   No dysuria or gross hematuria.  He continues to take Flomax which is effective.    PMH: Past Medical History:  Diagnosis Date  . AA (alcohol abuse) 02/28/2014  . Adenocarcinoma of prostate ([place]) 05/22/2015  . Alcohol abuse   . Apnea, sleep 02/28/2014  . Benign essential HTN 02/17/2015  . BPH with  obstruction/lower urinary tract symptoms 04/12/2015  . Cancer ([place])   . Cellulitis 09/21/2014  . Chronic obstructive pulmonary disease ([place]) 04/28/2014  . 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 COPD (chronic obstructive pulmonary disease) ([place])   . Elevated prostate specific antigen (PSA) 02/16/2015  . Elevated PSA    6.7 on 02/16/2015  . Essential (primary) hypertension 02/18/2015  . Genital herpes   . Hyperlipidemia   . Hypertension   . Lesion of external ear 04/28/2014  . Narcolepsy   . Rhinitis   . Sleep apnea    histort had surgery better  . Tobacco abuse     Surgical History: Past Surgical History:  Procedure Laterality Date  . CARDIAC CATHETERIZATION Left 05/23/2016   Procedure: Left Heart Cath and Coronary Angiography;  Surgeon: Corey Skains, MD;  Location: [place] [facility];  Service: Cardiovascular;  Laterality: Left;  . CARDIAC CATHETERIZATION N/A 05/23/2016   Procedure: Coronary Stent Intervention;  Surgeon: Isaias Cowman, MD;  Location: [place] [facility];  Service: Cardiovascular;  Laterality: N/A;  . RADIOACTIVE SEED IMPLANT N/A 11/20/2015   Procedure: RADIOACTIVE SEED IMPLANT/BRACHYTHERAPY IMPLANT;  Surgeon: Hollice Espy, MD;  Location: [facility] ORS;  Service: Urology;  Laterality: N/A;  . TONSILLECTOMY      Home Medications:  Allergies as of 05/16/2017   No Known Allergies     Medication List       Accurate as of 05/16/17  2:29 PM. Always use your  most recent med list.          amLODipine 5 MG tablet Commonly known as:  NORVASC Take by mouth.   aspirin 325 MG EC tablet Take 1 tablet (325 mg total) by mouth daily.   Budesonide 90 MCG/ACT inhaler Inhale into the lungs.   calcium-vitamin D 500-200 MG-UNIT tablet Take by mouth.   clopidogrel 75 MG tablet Commonly known as:  PLAVIX Take 1 tablet (75 mg total) by mouth daily with breakfast.   docusate sodium 100 MG capsule Commonly known as:  COLACE Take 1 capsule (100  mg total) by mouth 2 (two) times daily.   ipratropium-albuterol 0.5-2.5 (3) MG/3ML Soln Commonly known as:  DUONEB   isosorbide mononitrate 30 MG 24 hr tablet Commonly known as:  IMDUR Take by mouth.   lisinopril-hydrochlorothiazide 20-12.5 MG tablet Commonly known as:  PRINZIDE,ZESTORETIC Take by mouth.   rosuvastatin 10 MG tablet Commonly known as:  CRESTOR Take by mouth.   tamsulosin 0.4 MG Caps capsule Commonly known as:  FLOMAX Take 1 capsule (0.4 mg total) by mouth daily.   tiotropium 18 MCG inhalation capsule Commonly known as:  [place] into inhaler and inhale.   VENTOLIN HFA 108 (90 Base) MCG/ACT inhaler Generic drug:  albuterol Inhale into the lungs.            Discharge Care Instructions        Start     Ordered   05/16/17 0000  PSA     05/16/17 1428      Allergies: No Known Allergies  Family History: Family History  Problem Relation Age of Onset  . Heart attack Mother   . Heart disease Mother   . Heart attack Father   . Kidney disease Neg Hx   . Prostate cancer Neg Hx   . Kidney cancer Neg Hx   . Bladder Cancer Neg Hx     Social History:  reports that he has been smoking.  He has been smoking about 1.50 packs per day. He has never used smokeless tobacco. He reports that he drinks about 4.8 oz of alcohol per week . He reports that he does not use drugs.  ROS UROLOGY Frequent Urination?: No Hard to postpone urination?: No Burning/pain with urination?: No Get up at night to urinate?: Yes Leakage of urine?: No Urine stream starts and stops?: No Trouble starting stream?: No Do you have to strain to urinate?: No Blood in urine?: No Urinary tract infection?: No Sexually transmitted disease?: No Injury to kidneys or bladder?: No Painful intercourse?: No Weak stream?: No Erection problems?: No Penile pain?: No Gastrointestinal Nausea?: No Vomiting?: No Indigestion/heartburn?: No Diarrhea?: No Constipation?:  No Constitutional Fever: No Night sweats?: No Weight loss?: No Fatigue?: No Skin Skin rash/lesions?: No Itching?: No Eyes Blurred vision?: No Double vision?: No Ears/Nose/Throat Sore throat?: No Sinus problems?: No Hematologic/Lymphatic Swollen glands?: No Easy bruising?: No Cardiovascular Leg swelling?: No Chest pain?: No Respiratory Cough?: Yes Shortness of breath?: Yes Endocrine Excessive thirst?: No Musculoskeletal Back pain?: No Joint pain?: No Neurological Headaches?: No Dizziness?: No Psychologic Depression?: No Anxiety?: No   Physical Exam: BP 128/76   Pulse 76   Ht 6' (1.829 m)   Wt 216 lb (98 kg)   BMI 29.29 kg/m   Constitutional:  Alert and oriented, No acute distress.   HEENT: [place] AT, moist mucus membranes.  Trachea midline, no masses.  Slightly erythematous sclera. Cardiovascular: No clubbing, cyanosis, or edema. Respiratory: Normal respiratory effort, no increased  work of breathing.. Abd: soft, NT, ND Skin: No rashes, bruises or suspicious lesions.  Facial telangiectasia. Neurologic: Grossly intact, no focal deficits, moving all 4 extremities.   Psychiatric: Normal mood and affect.  Laboratory Data: CBC    Component Value Date/Time   WBC 6.8 05/24/2016 0428   RBC 4.23 (L) 05/24/2016 0428   HGB 13.4 05/24/2016 0428   HCT 38.4 (L) 05/24/2016 0428   PLT 147 (L) 05/24/2016 0428   MCV 90.9 05/24/2016 0428   MCH 31.7 05/24/2016 0428   MCHC 34.9 05/24/2016 0428   RDW 13.2 05/24/2016 0428   LYMPHSABS 1.3 08/22/2015 1510   MONOABS 0.6 08/22/2015 1510   EOSABS 0.2 08/22/2015 1510   BASOSABS 0.0 08/22/2015 1510     Lab Results  Component Value Date   PSA 0.05 10/09/2016   PSA 0.01 05/09/2016   PSA 7.78 (H) 08/28/2015    Assessment & Plan:    1. Prostate cancer  T2aN0M0 prostate cancer s/p brachytherapy I-125 seed placement 11/2014.   S/p Lupron x 6 months completed PSA 0.5, possibly nadir PSA recheck in 6 months- lab only visit  with M.D. visit in 1 year Continue Flomax  2. Smoker Discussed the importance of smoking cessation-he knows he should quit but not able to and not interested at this time  Return in about 1 year (around 05/16/2018) for PSA (recheck PSA in 6 months for lab only).   Hollice Espy, MD  [facility] [phone]

## 2017-05-28 DIAGNOSIS — Z23 Encounter for immunization: Secondary | ICD-10-CM | POA: Diagnosis not present

## 2017-07-14 DIAGNOSIS — E871 Hypo-osmolality and hyponatremia: Secondary | ICD-10-CM | POA: Diagnosis not present

## 2017-07-21 DIAGNOSIS — Z23 Encounter for immunization: Secondary | ICD-10-CM | POA: Diagnosis not present

## 2017-07-21 DIAGNOSIS — G4733 Obstructive sleep apnea (adult) (pediatric): Secondary | ICD-10-CM | POA: Diagnosis not present

## 2017-07-21 DIAGNOSIS — J449 Chronic obstructive pulmonary disease, unspecified: Secondary | ICD-10-CM | POA: Diagnosis not present

## 2017-07-21 DIAGNOSIS — R739 Hyperglycemia, unspecified: Secondary | ICD-10-CM | POA: Diagnosis not present

## 2017-07-21 DIAGNOSIS — F1020 Alcohol dependence, uncomplicated: Secondary | ICD-10-CM | POA: Diagnosis not present

## 2017-07-21 DIAGNOSIS — I10 Essential (primary) hypertension: Secondary | ICD-10-CM | POA: Diagnosis not present

## 2017-07-21 DIAGNOSIS — I2510 Atherosclerotic heart disease of native coronary artery without angina pectoris: Secondary | ICD-10-CM | POA: Diagnosis not present

## 2017-07-21 DIAGNOSIS — E871 Hypo-osmolality and hyponatremia: Secondary | ICD-10-CM | POA: Diagnosis not present

## 2017-07-21 DIAGNOSIS — E7800 Pure hypercholesterolemia, unspecified: Secondary | ICD-10-CM | POA: Diagnosis not present

## 2017-08-12 DIAGNOSIS — Z720 Tobacco use: Secondary | ICD-10-CM | POA: Diagnosis not present

## 2017-08-12 DIAGNOSIS — I2510 Atherosclerotic heart disease of native coronary artery without angina pectoris: Secondary | ICD-10-CM | POA: Diagnosis not present

## 2017-08-12 DIAGNOSIS — I10 Essential (primary) hypertension: Secondary | ICD-10-CM | POA: Diagnosis not present

## 2017-08-12 DIAGNOSIS — G4733 Obstructive sleep apnea (adult) (pediatric): Secondary | ICD-10-CM | POA: Diagnosis not present

## 2017-08-12 DIAGNOSIS — I6523 Occlusion and stenosis of bilateral carotid arteries: Secondary | ICD-10-CM | POA: Diagnosis not present

## 2017-08-12 DIAGNOSIS — Z9989 Dependence on other enabling machines and devices: Secondary | ICD-10-CM | POA: Diagnosis not present

## 2017-08-27 ENCOUNTER — Other Ambulatory Visit: Payer: Self-pay | Admitting: *Deleted

## 2017-08-27 DIAGNOSIS — C61 Malignant neoplasm of prostate: Secondary | ICD-10-CM

## 2017-10-13 DIAGNOSIS — E7800 Pure hypercholesterolemia, unspecified: Secondary | ICD-10-CM | POA: Diagnosis not present

## 2017-10-13 DIAGNOSIS — R739 Hyperglycemia, unspecified: Secondary | ICD-10-CM | POA: Diagnosis not present

## 2017-10-13 DIAGNOSIS — E871 Hypo-osmolality and hyponatremia: Secondary | ICD-10-CM | POA: Diagnosis not present

## 2017-10-13 DIAGNOSIS — I10 Essential (primary) hypertension: Secondary | ICD-10-CM | POA: Diagnosis not present

## 2017-10-21 ENCOUNTER — Other Ambulatory Visit: Payer: Self-pay

## 2017-10-21 ENCOUNTER — Emergency Department: Payer: Medicare HMO

## 2017-10-21 ENCOUNTER — Emergency Department
Admission: EM | Admit: 2017-10-21 | Discharge: 2017-10-21 | Disposition: A | Discharge status: Home / Self Care | Payer: Medicare HMO | Attending: Emergency Medicine | Admitting: Emergency Medicine

## 2017-10-21 DIAGNOSIS — Z7902 Long term (current) use of antithrombotics/antiplatelets: Secondary | ICD-10-CM | POA: Insufficient documentation

## 2017-10-21 DIAGNOSIS — Z9861 Coronary angioplasty status: Secondary | ICD-10-CM | POA: Diagnosis not present

## 2017-10-21 DIAGNOSIS — I10 Essential (primary) hypertension: Secondary | ICD-10-CM | POA: Insufficient documentation

## 2017-10-21 DIAGNOSIS — F17210 Nicotine dependence, cigarettes, uncomplicated: Secondary | ICD-10-CM | POA: Insufficient documentation

## 2017-10-21 DIAGNOSIS — J449 Chronic obstructive pulmonary disease, unspecified: Secondary | ICD-10-CM | POA: Insufficient documentation

## 2017-10-21 DIAGNOSIS — Z79899 Other long term (current) drug therapy: Secondary | ICD-10-CM | POA: Insufficient documentation

## 2017-10-21 DIAGNOSIS — I4891 Unspecified atrial fibrillation: Secondary | ICD-10-CM | POA: Diagnosis not present

## 2017-10-21 DIAGNOSIS — Z7982 Long term (current) use of aspirin: Secondary | ICD-10-CM | POA: Diagnosis not present

## 2017-10-21 DIAGNOSIS — I2510 Atherosclerotic heart disease of native coronary artery without angina pectoris: Secondary | ICD-10-CM | POA: Insufficient documentation

## 2017-10-21 DIAGNOSIS — R0602 Shortness of breath: Secondary | ICD-10-CM

## 2017-10-21 LAB — CBC
HEMATOCRIT: 45.4 % (ref 40.0–52.0)
Hemoglobin: 15.1 g/dL (ref 13.0–18.0)
MCH: 29.7 pg (ref 26.0–34.0)
MCHC: 33.4 g/dL (ref 32.0–36.0)
MCV: 89 fL (ref 80.0–100.0)
PLATELETS: 157 10*3/uL (ref 150–440)
RBC: 5.1 MIL/uL (ref 4.40–5.90)
RDW: 13.7 % (ref 11.5–14.5)
WBC: 5.6 10*3/uL (ref 3.8–10.6)

## 2017-10-21 LAB — BASIC METABOLIC PANEL
Anion gap: 10 (ref 5–15)
BUN: 13 mg/dL (ref 6–20)
CHLORIDE: 96 mmol/L — AB (ref 101–111)
CO2: 28 mmol/L (ref 22–32)
CREATININE: 0.81 mg/dL (ref 0.61–1.24)
Calcium: 9.5 mg/dL (ref 8.9–10.3)
GFR calc Af Amer: >60 mL/min (ref >60)
GFR calc non Af Amer: >60 mL/min (ref >60)
GLUCOSE: 94 mg/dL (ref 65–99)
POTASSIUM: 3.6 mmol/L (ref 3.5–5.1)
Sodium: 134 mmol/L — ABNORMAL LOW (ref 135–145)

## 2017-10-21 LAB — TROPONIN I: Troponin I: <0.03 ng/mL (ref <0.03)

## 2017-10-21 MED ORDER — APIXABAN 5 MG PO TABS
5.0000 mg | ORAL_TABLET | ORAL | Status: AC
  Administered 2017-10-21: 5 mg via ORAL
  Filled 2017-10-21: qty 1

## 2017-10-21 MED ORDER — APIXABAN 5 MG PO TABS: 5.0000 mg | ORAL_TABLET | Freq: Two times a day (BID) | ORAL | 1 refills | Status: DC

## 2017-10-21 NOTE — ED Provider Notes (Signed)
[facility] Emergency Department Provider Note  ____________________________________________   First MD Initiated Contact with Patient 10/21/17 1927     (approximate)  I have reviewed the triage vital signs and the nursing notes.   HISTORY  Chief Complaint Shortness of Breath    HPI Robert Powell is a 67 y.o. male with a history of ACS status post stent placement about a year and a half ago who sees Dr. Nehemiah Massed for cardiology care.  He presents today at the request of his primary care provider for further evaluation of shortness of breath.  He has felt shortness of breath for 3-4 weeks, worse with exertion and position change, occasionally with accompanied lightheadedness and dizziness.  He denies having any chest pain during that period of time.  He denies fever/chills, nausea, vomiting, abdominal pain, and dysuria.  He does not like going to the doctors so finally today after 3-4 weeks of symptoms he went to see his primary care provider and she noted that he is now in atrial fibrillation, new in onset from the last time he checked with no prior history.  He was referred to the emergency department for further management.  He reports his symptoms wax and wane from mild to moderate severity.  Nothing in particular makes her symptoms better except for rest and they are worsened as described above.  Past Medical History:  Diagnosis Date  . AA (alcohol abuse) 02/28/2014  . Adenocarcinoma of prostate ([place]) 05/22/2015  . Alcohol abuse   . Apnea, sleep 02/28/2014  . Benign essential HTN 02/17/2015  . 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 Cancer ([place])   . Cellulitis 09/21/2014  . Chronic obstructive pulmonary disease ([place]) 04/28/2014  . 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 COPD (chronic obstructive pulmonary disease) ([place])   . Elevated prostate specific antigen (PSA) 02/16/2015  . Elevated PSA    6.7 on 02/16/2015    . Essential (primary) hypertension 02/18/2015  . Genital herpes   . Hyperlipidemia   . Hypertension   . Lesion of external ear 04/28/2014  . Narcolepsy   . Rhinitis   . Sleep apnea    histort had surgery better  . Tobacco abuse     Patient Active Problem List   Diagnosis Date Noted  . CAD (coronary artery disease) 05/23/2016  . Unstable angina ([place]) 05/20/2016  . Continuous chronic alcoholism ([place]) 09/26/2015  . History of alcoholism ([place]) 09/26/2015  . Adenocarcinoma of prostate ([place]) 05/22/2015  . 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 Elevated PSA 03/14/2015  . BPH (benign prostatic hyperplasia) 03/14/2015  . Essential (primary) hypertension 02/18/2015  . Benign essential HTN 02/17/2015  . Elevated prostate specific antigen (PSA) 02/16/2015  . Cellulitis 09/21/2014  . Breathlessness on exertion 08/29/2014  . Chronic obstructive pulmonary disease ([place]) 04/28/2014  . Lesion of external ear 04/28/2014  . AA (alcohol abuse) 02/28/2014  . 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 Apnea, sleep 02/28/2014  . Current tobacco use 02/28/2014    Past Surgical History:  Procedure Laterality Date  . CARDIAC CATHETERIZATION Left 05/23/2016   Procedure: Left Heart Cath and Coronary Angiography;  Surgeon: Corey Skains, MD;  Location: [place] [facility];  Service: Cardiovascular;  Laterality: Left;  . CARDIAC CATHETERIZATION N/A 05/23/2016   Procedure: Coronary Stent Intervention;  Surgeon: Isaias Cowman, MD;  Location: [place] [facility];  Service: Cardiovascular;  Laterality: N/A;  . RADIOACTIVE SEED IMPLANT  N/A 11/20/2015   Procedure: RADIOACTIVE SEED IMPLANT/BRACHYTHERAPY IMPLANT;  Surgeon: Hollice Espy, MD;  Location: [facility] OR[facility];  Service: Urology;  Laterality: N/A;  . TONSILLECTOMY      Prior to Admission medications   Medication Sig Start Date End Date Taking? Authorizing Provider  albuterol (VENTOLIN HFA) 108 (90  BASE) MCG/ACT inhaler Inhale into the lungs. 02/22/15   [provider]  amLODipine (NORVASC) 5 MG tablet Take by mouth. 11/16/14 06/21/17  [provider]  apixaban (ELIQUIS) 5 MG TABS tablet Take 1 tablet (5 mg total) by mouth 2 (two) times daily. 10/21/17   Hinda Kehr, MD  aspirin EC 325 MG EC tablet Take 1 tablet (325 mg total) by mouth daily. 05/24/16   Corey Skains, MD  Budesonide 90 MCG/ACT inhaler Inhale into the lungs. 10/15/16 10/15/17  [provider]  Calcium Carbonate-Vitamin D (CALCIUM-VITAMIN D) 500-200 MG-UNIT tablet Take by mouth.    [provider]  clopidogrel (PLAVIX) 75 MG tablet Take 1 tablet (75 mg total) by mouth daily with breakfast. 05/24/16   Corey Skains, MD  docusate sodium (COLACE) 100 MG capsule Take 1 capsule (100 mg total) by mouth 2 (two) times daily. 11/20/15   Hollice Espy, MD  ipratropium-albuterol (DUONEB) 0.5-2.5 (3) MG/3ML SOLN  06/14/15   [provider]  isosorbide mononitrate (IMDUR) 30 MG 24 hr tablet Take by mouth. 05/31/16 05/31/17  [provider]  lisinopril-hydrochlorothiazide (PRINZIDE,ZESTORETIC) 20-12.5 MG per tablet Take by mouth. 10/05/14 05/16/17  [provider]  rosuvastatin (CRESTOR) 10 MG tablet Take by mouth. 05/31/16 05/31/17  [provider]  tamsulosin (FLOMAX) 0.4 MG CAPS capsule Take 1 capsule (0.4 mg total) by mouth daily. 11/20/15   Hollice Espy, MD  tiotropium (SPIRIVA) 18 MCG inhalation capsule Place into inhaler and inhale. 02/09/15 05/16/17  [provider]    Allergies Patient has no known allergies.  Family History  Problem Relation Age of Onset  . Heart attack Mother   . Heart disease Mother   . Heart attack Father   . Kidney disease Neg Hx   . Prostate cancer Neg Hx   . Kidney cancer Neg Hx   . Bladder Cancer Neg Hx     Social History Social History   Tobacco Use  . Smoking status: Current Every Day Smoker    Packs/day: 1.50  .  Smokeless tobacco: Never Used  Substance Use Topics  . Alcohol use: Yes    Alcohol/week: 4.8 oz    Types: 8 Glasses of wine per week    Comment: occ  . Drug use: No    Review of Systems Constitutional: No fever/chills Eyes: No visual changes. ENT: No sore throat. Cardiovascular: Denies chest pain.   Respiratory: shortness of breath, worse with exertion and positional change Gastrointestinal: No abdominal pain.  No nausea, no vomiting.  No diarrhea.  No constipation. Genitourinary: Negative for dysuria. Musculoskeletal: Negative for neck pain.  Negative for back pain. Integumentary: Negative for rash. Neurological: Negative for headaches, focal weakness or numbness.   ____________________________________________   PHYSICAL EXAM:  VITAL SIGNS: ED Triage Vitals [10/21/17 1547]  Enc Vitals Group     BP (!) 137/100     Pulse Rate (!) 104     Resp 18     Temp 97.8 F (36.6 C)     Temp Source Oral     SpO2 95 %     Weight 97.5 kg (215 lb)     Height 1.829 m (6')  Head Circumference      Peak Flow      Pain Score      Pain Loc      Pain Edu?      Excl. in [place]?     Constitutional: Alert and oriented. Well appearing and in no acute distress. Eyes: Conjunctivae are normal.  Head: Atraumatic. Nose: No congestion/rhinnorhea. Mouth/Throat: Mucous membranes are moist. Neck: No stridor.  No meningeal signs.   Cardiovascular: Irregularly irregular rhythm with borderline tachycardia, sometimes in the 90s and sometimes in the 110s. Good peripheral circulation. Grossly normal heart sounds. Respiratory: Normal respiratory effort.  No retractions. Lungs CTAB. Gastrointestinal: Soft and nontender. No distention.  Musculoskeletal: No lower extremity tenderness nor edema. No gross deformities of extremities. Neurologic:  Normal speech and language. No gross focal neurologic deficits are appreciated.  Skin:  Skin is warm, dry and intact. No rash noted. Psychiatric: Mood and affect  are normal. Speech and behavior are normal.  ____________________________________________   LABS (all labs ordered are listed, but only abnormal results are displayed)  Labs Reviewed  BASIC METABOLIC PANEL - Abnormal; Notable for the following components:      Result Value   Sodium 134 (*)    Chloride 96 (*)    All other components within normal limits  CBC  TROPONIN I  PROTIME-INR   ____________________________________________  EKG  ED ECG REPORT I, Hinda Kehr, the attending physician, personally viewed and interpreted this ECG.  Date: 10/21/2017 EKG Time: 16: 24 Rate: 113 Rhythm: Atrial fibrillation with mild RVR QRS Axis: normal Intervals: normal ST/T Wave abnormalities: Non-specific ST segment / T-wave changes, but no evidence of acute ischemia. Narrative Interpretation: no evidence of acute ischemia   ____________________________________________  RADIOLOGY I, Hinda Kehr, personally viewed and evaluated these images (plain radiographs) as part of my medical decision making, as well as reviewing the written report by the radiologist.  ED MD interpretation: No evidence of acute abnormality such as infiltrate  Official radiology report(s): Dg Chest 2 View  Result Date: 10/21/2017 CLINICAL DATA:  Shortness of breath. EXAM: CHEST  2 VIEW COMPARISON:  CT scan of August 18, 2015. FINDINGS: The heart size and mediastinal contours are within normal limits. No pneumothorax or pleural effusion is noted. Lungs are clear. The visualized skeletal structures are unremarkable. IMPRESSION: No active cardiopulmonary disease. Electronically Signed   By: Marijo Conception, M.D.   On: 10/21/2017 16:38    ____________________________________________   PROCEDURES  Critical Care performed: No   Procedure(s) performed:   Procedures   ____________________________________________   INITIAL IMPRESSION / ASSESSMENT AND PLAN / ED COURSE  As part of my medical decision  making, I reviewed the following data within the [facility] notes reviewed and incorporated, Labs reviewed , EKG interpreted , Old chart reviewed, Radiograph reviewed  and A phone consult was requested and obtained from this/these consultant(s) Cardiology (Dr. Saralyn Pilar).    Differential includes, but is not limited to, viral syndrome, bronchitis including COPD exacerbation, pneumonia, reactive airway disease including asthma, CHF including exacerbation with or without pulmonary/interstitial edema, pneumothorax, ACS, thoracic trauma, and pulmonary embolism.  However, in this particular case of believe the patient has had intermittent but persistent shortness of breath over the last 3-4 weeks due to new onset atrial fibrillation.  His workup is otherwise reassuring today with no evidence of acute or emergent medical condition.  His heart rate is borderline tachycardic but generally normal.  He is not having any chest pain, has  no infectious symptoms, and he has normal renal function.  I spoke with phone with Dr. Saralyn Pilar who is on-call for cardiology and he agreed with the plan to initiate Eliquis anticoagulation and have close follow-up with Dr. Nehemiah Massed.  The patient is in strong agreement with the plan and does not want to stay in the hospital.   I gave my usual and customary return precautions and a first dose of Eliquis in the ED tonight.       ____________________________________________  FINAL CLINICAL IMPRESSION(S) / ED DIAGNOSES  Final diagnoses:  New onset atrial fibrillation (HCC)  SOB (shortness of breath)     MEDICATIONS GIVEN DURING THIS VISIT:  Medications  apixaban (ELIQUIS) tablet 5 mg (5 mg Oral Given 10/21/17 2038)     ED Discharge Orders        Ordered    apixaban (ELIQUIS) 5 MG TABS tablet  2 times daily     10/21/17 2032       Note:  This document was prepared using Dragon voice recognition software and may include unintentional  dictation errors.    Hinda Kehr, MD 10/21/17 2101

## 2017-10-21 NOTE — ED Notes (Signed)
Pt is ambulatory to wheel chair upon discharge. Verbalized understanding of discharge instructions, follow-up care and prescriptions. VSS. Skin warm and dry. A&Ox4.

## 2017-10-21 NOTE — ED Triage Notes (Signed)
Pt comes into the ED from [facility] with c/o SOB and dizziness for the past 3 weeks, staff brought ECG with noted a-fib with a rate of 118.  Pt denies any pain at present

## 2017-10-21 NOTE — Discharge Instructions (Signed)
As we discussed, your workup was reassuring except for the fact that at some point in the last month you have developed atrial fibrillation.  We recommend you start the prescribed medication and take it twice a day as written.  Please follow-up with Dr. Nehemiah Massed at the next available opportunity.  Return to the emergency department if you develop new or worsening symptoms that concern you.

## 2017-10-21 NOTE — ED Notes (Signed)
Spoke with pt about wait times and what to expect next. Advised pt that I am available for further questions if needed.  

## 2017-10-31 DIAGNOSIS — I6523 Occlusion and stenosis of bilateral carotid arteries: Secondary | ICD-10-CM | POA: Diagnosis not present

## 2017-10-31 DIAGNOSIS — I2510 Atherosclerotic heart disease of native coronary artery without angina pectoris: Secondary | ICD-10-CM | POA: Diagnosis not present

## 2017-10-31 DIAGNOSIS — R0602 Shortness of breath: Secondary | ICD-10-CM | POA: Diagnosis not present

## 2017-10-31 DIAGNOSIS — I480 Paroxysmal atrial fibrillation: Secondary | ICD-10-CM | POA: Diagnosis not present

## 2017-10-31 DIAGNOSIS — I739 Peripheral vascular disease, unspecified: Secondary | ICD-10-CM | POA: Diagnosis not present

## 2017-10-31 DIAGNOSIS — I4891 Unspecified atrial fibrillation: Secondary | ICD-10-CM | POA: Diagnosis not present

## 2017-11-03 ENCOUNTER — Inpatient Hospital Stay: Payer: Medicare HMO | Attending: Radiation Oncology

## 2017-11-03 DIAGNOSIS — C61 Malignant neoplasm of prostate: Secondary | ICD-10-CM | POA: Diagnosis not present

## 2017-11-03 LAB — PSA: PROSTATIC SPECIFIC ANTIGEN: 0.06 ng/mL (ref 0.00–4.00)

## 2017-11-04 DIAGNOSIS — I4891 Unspecified atrial fibrillation: Secondary | ICD-10-CM | POA: Diagnosis not present

## 2017-11-10 ENCOUNTER — Encounter: Payer: Self-pay | Admitting: Radiation Oncology

## 2017-11-10 ENCOUNTER — Other Ambulatory Visit: Payer: Self-pay | Admitting: *Deleted

## 2017-11-10 ENCOUNTER — Other Ambulatory Visit: Payer: Self-pay

## 2017-11-10 ENCOUNTER — Ambulatory Visit
Admission: RE | Admit: 2017-11-10 | Discharge: 2017-11-10 | Disposition: A | Discharge status: Home / Self Care | Payer: Medicare HMO | Source: Ambulatory Visit | Attending: Radiation Oncology | Admitting: Radiation Oncology

## 2017-11-10 VITALS — BP 122/79 | HR 91 | Temp 96.8°F | Resp 20 | Wt 216.2 lb

## 2017-11-10 DIAGNOSIS — Z923 Personal history of irradiation: Secondary | ICD-10-CM | POA: Diagnosis not present

## 2017-11-10 DIAGNOSIS — C61 Malignant neoplasm of prostate: Secondary | ICD-10-CM | POA: Diagnosis not present

## 2017-11-10 NOTE — Progress Notes (Signed)
Radiation Oncology Follow up Note  Name: Robert Powell   Date:   11/10/2017 MRN:  [id number] DOB: 03/20/1951    This 67 y.o. male presents to the clinic today for a 2 year follow-up status post I-125 interstitial implant for adenocarcinoma the prostate.  REFERRING PROVIDER: Glendon Axe, MD  HPI: Patient is a 67 year old male now out 2 years having completed I-125 interstitial implant for adenocarcinoma of the prostate Gleason 6. He is seen today in routine follow-up is doing well he specifically denies any increased lower urinary tract symptoms diarrhea fatigue. His most recent PSA on February 25 was 9.53.  COMPLICATIONS OF TREATMENT: none  FOLLOW UP COMPLIANCE: keeps appointments   PHYSICAL EXAM:  BP 122/79   Pulse 91   Temp (!) 96.8 F (36 C)   Resp 20   Wt 216 lb 2.6 oz (98.1 kg)   BMI 29.32 kg/m  Well-developed well-nourished patient in NAD. HEENT reveals PERLA, EOMI, discs not visualized.  Oral cavity is clear. No oral mucosal lesions are identified. Neck is clear without evidence of cervical or supraclavicular adenopathy. Lungs are clear to A&P. Cardiac examination is essentially unremarkable with regular rate and rhythm without murmur rub or thrill. Abdomen is benign with no organomegaly or masses noted. Motor sensory and DTR levels are equal and symmetric in the upper and lower extremities. Cranial nerves II through XII are grossly intact. Proprioception is intact. No peripheral adenopathy or edema is identified. No motor or sensory levels are noted. Crude visual fields are within normal range.  RADIOLOGY RESULTS: No current films for review  PLAN: Present time he is under excellent biochemical control of his prostate cancer. I've asked to see him back in 1 year for follow-up and a PSA prior to that visit. He knows to call with any concerns at any time.  I would like to take this opportunity to thank you for allowing me to participate in the care of your patient.Noreene Filbert, MD

## 2017-11-18 DIAGNOSIS — I2510 Atherosclerotic heart disease of native coronary artery without angina pectoris: Secondary | ICD-10-CM | POA: Diagnosis not present

## 2017-12-08 DIAGNOSIS — I071 Rheumatic tricuspid insufficiency: Secondary | ICD-10-CM | POA: Diagnosis not present

## 2017-12-08 DIAGNOSIS — I10 Essential (primary) hypertension: Secondary | ICD-10-CM | POA: Diagnosis not present

## 2017-12-08 DIAGNOSIS — I480 Paroxysmal atrial fibrillation: Secondary | ICD-10-CM | POA: Diagnosis not present

## 2017-12-08 DIAGNOSIS — I2510 Atherosclerotic heart disease of native coronary artery without angina pectoris: Secondary | ICD-10-CM | POA: Diagnosis not present

## 2018-04-16 DIAGNOSIS — I482 Chronic atrial fibrillation, unspecified: Secondary | ICD-10-CM | POA: Insufficient documentation

## 2018-04-16 DIAGNOSIS — Z720 Tobacco use: Secondary | ICD-10-CM | POA: Diagnosis not present

## 2018-04-16 DIAGNOSIS — I10 Essential (primary) hypertension: Secondary | ICD-10-CM | POA: Diagnosis not present

## 2018-04-16 DIAGNOSIS — I480 Paroxysmal atrial fibrillation: Secondary | ICD-10-CM | POA: Diagnosis not present

## 2018-04-16 DIAGNOSIS — E782 Mixed hyperlipidemia: Secondary | ICD-10-CM | POA: Diagnosis not present

## 2018-04-16 DIAGNOSIS — I2510 Atherosclerotic heart disease of native coronary artery without angina pectoris: Secondary | ICD-10-CM | POA: Diagnosis not present

## 2018-05-15 ENCOUNTER — Ambulatory Visit (INDEPENDENT_AMBULATORY_CARE_PROVIDER_SITE_OTHER): Payer: Medicare HMO | Admitting: Urology

## 2018-05-15 ENCOUNTER — Encounter: Payer: Self-pay | Admitting: Urology

## 2018-05-15 ENCOUNTER — Other Ambulatory Visit
Admission: RE | Admit: 2018-05-15 | Discharge: 2018-05-15 | Disposition: A | Discharge status: Home / Self Care | Payer: Medicare HMO | Source: Ambulatory Visit | Attending: Urology | Admitting: Urology

## 2018-05-15 VITALS — BP 96/63 | HR 87 | Ht 72.0 in | Wt 212.0 lb

## 2018-05-15 DIAGNOSIS — Z8546 Personal history of malignant neoplasm of prostate: Secondary | ICD-10-CM

## 2018-05-15 DIAGNOSIS — C61 Malignant neoplasm of prostate: Secondary | ICD-10-CM | POA: Diagnosis not present

## 2018-05-15 LAB — PSA: PROSTATIC SPECIFIC ANTIGEN: 0.05 ng/mL (ref 0.00–4.00)

## 2018-05-15 NOTE — Progress Notes (Signed)
05/15/2018 3:32 PM   Robert Powell 1950-10-09 [id number]  Referring provider: Glendon Axe, MD [facility] [place], [place] [address]  Chief Complaint  Patient presents with  . Prostate Cancer    HPI: 67 year old male with personal history of prostate cancer status post brachytherapy who returns today for routine annual follow-up.   He was first dx with Gleason 3+3 and single core of 3+4 prostate cancer in 04/2015 involving 7/12 cores bilaterally with largest burden involving 100% of the left lateral core with Gleason 3+4 (only 5% or less of 4 component).  PSA 6.2 at the time of dx. 35 cc prostate with firm nodule of the left mid gland.  TRUS ultrasound with diffuse prostatic calcifications and hypoechoic area at site of nodule.     CT abd/ pelvis showed multiple lytic lesions in L2 L3 L4 vertebral bodies.  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 There is no obvious pelvic lymphadenopathy.   As part of follow-up, he underwent bone scan which was negative for any evidence of metastatic disease.   He elected to undergo brachytherapy seed placement for treatment of his prostate cancer with myself and Dr. Baruch Gouty on 11/20/15.   He completed 6 months Lupron.    His PSA is remained quite low.  He last had his PSA checked in 10/2017 which was 0.06.  PSA was repeated today and is pending.  He reports that he is been doing well.  He has no voiding symptoms today.  He denies any weak stream, incomplete bladder emptying, frequency or urgency.  He is overall quite pleased.  He is not on any BPH medications.  No weight loss or bone pain.   PMH: Past Medical History:  Diagnosis Date  . AA (alcohol abuse) 02/28/2014  . Adenocarcinoma of prostate ([place]) 05/22/2015  . Alcohol abuse   . Apnea, sleep 02/28/2014  . Benign essential HTN 02/17/2015  . 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 Cancer ([place])   . Cellulitis  09/21/2014  . Chronic obstructive pulmonary disease ([place]) 04/28/2014  . 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 COPD (chronic obstructive pulmonary disease) ([place])   . Elevated prostate specific antigen (PSA) 02/16/2015  . Elevated PSA    6.7 on 02/16/2015  . Essential (primary) hypertension 02/18/2015  . Genital herpes   . Hyperlipidemia   . Hypertension   . Lesion of external ear 04/28/2014  . Narcolepsy   . Rhinitis   . Sleep apnea    histort had surgery better  . Tobacco abuse     Surgical History: Past Surgical History:  Procedure Laterality Date  . CARDIAC CATHETERIZATION Left 05/23/2016   Procedure: Left Heart Cath and Coronary Angiography;  Surgeon: Corey Skains, MD;  Location: [place] [facility];  Service: Cardiovascular;  Laterality: Left;  . CARDIAC CATHETERIZATION N/A 05/23/2016   Procedure: Coronary Stent Intervention;  Surgeon: Isaias Cowman, MD;  Location: [place] [facility];  Service: Cardiovascular;  Laterality: N/A;  . RADIOACTIVE SEED IMPLANT N/A 11/20/2015   Procedure: RADIOACTIVE SEED IMPLANT/BRACHYTHERAPY IMPLANT;  Surgeon: Hollice Espy, MD;  Location: [facility] ORS;  Service: Urology;  Laterality: N/A;  . TONSILLECTOMY      Home Medications:  Allergies as of 05/15/2018   No Known Allergies     Medication List        Accurate as of 05/15/18  3:32 PM. Always use your most recent med list.  amLODipine 5 MG tablet Commonly known as:  NORVASC Take by mouth.   apixaban 5 MG Tabs tablet Commonly known as:  ELIQUIS Take 1 tablet (5 mg total) by mouth 2 (two) times daily.   aspirin 325 MG EC tablet Take 1 tablet (325 mg total) by mouth daily.   Budesonide 90 MCG/ACT inhaler Inhale into the lungs.   calcium-vitamin D 500-200 MG-UNIT tablet Take by mouth.   clopidogrel 75 MG tablet Commonly known as:  PLAVIX Take 1 tablet (75 mg total) by mouth daily with breakfast.   diltiazem 240 MG 24 hr capsule Commonly  known as:  CARDIZEM CD Take by mouth.   docusate sodium 100 MG capsule Commonly known as:  COLACE Take 1 capsule (100 mg total) by mouth 2 (two) times daily.   ipratropium-albuterol 0.5-2.5 (3) MG/3ML Soln Commonly known as:  DUONEB   isosorbide mononitrate 30 MG 24 hr tablet Commonly known as:  IMDUR Take by mouth.   lisinopril-hydrochlorothiazide 20-12.5 MG tablet Commonly known as:  PRINZIDE,ZESTORETIC Take by mouth.   rosuvastatin 10 MG tablet Commonly known as:  CRESTOR Take by mouth.   tamsulosin 0.4 MG Caps capsule Commonly known as:  FLOMAX Take 1 capsule (0.4 mg total) by mouth daily.   tiotropium 18 MCG inhalation capsule Commonly known as:  [place] into inhaler and inhale.   VENTOLIN HFA 108 (90 Base) MCG/ACT inhaler Generic drug:  albuterol Inhale into the lungs.   VITAMIN D-1000 MAX ST 1000 units tablet Generic drug:  Cholecalciferol Take by mouth.       Allergies: No Known Allergies  Family History: Family History  Problem Relation Age of Onset  . Heart attack Mother   . Heart disease Mother   . Heart attack Father   . Kidney disease Neg Hx   . Prostate cancer Neg Hx   . Kidney cancer Neg Hx   . Bladder Cancer Neg Hx     Social History:  reports that he has been smoking. He has been smoking about 1.50 packs per day. He has never used smokeless tobacco. He reports that he drinks about 8.0 standard drinks of alcohol per week. He reports that he does not use drugs.  ROS: UROLOGY Frequent Urination?: No Hard to postpone urination?: No Burning/pain with urination?: No Get up at night to urinate?: Yes Leakage of urine?: No Urine stream starts and stops?: No Trouble starting stream?: No Do you have to strain to urinate?: No Blood in urine?: No Urinary tract infection?: No Sexually transmitted disease?: No Injury to kidneys or bladder?: No Painful intercourse?: No Weak stream?: No Erection problems?: No Penile pain?:  No  Gastrointestinal Nausea?: No Vomiting?: No Indigestion/heartburn?: No Diarrhea?: No Constipation?: No  Constitutional Fever: No Night sweats?: No Weight loss?: No Fatigue?: No  Skin Skin rash/lesions?: No Itching?: No  Eyes Blurred vision?: No Double vision?: No  Ears/Nose/Throat Sore throat?: No Sinus problems?: No  Hematologic/Lymphatic Swollen glands?: No Easy bruising?: No  Cardiovascular Leg swelling?: No Chest pain?: No  Respiratory Cough?: No Shortness of breath?: Yes  Endocrine Excessive thirst?: No  Musculoskeletal Back pain?: No Joint pain?: No  Neurological Headaches?: No Dizziness?: No  Psychologic Depression?: No Anxiety?: No  Physical Exam: BP 96/63 (BP Location: Left Arm, Patient Position: Sitting, Cuff Size: Normal)   Pulse 87   Ht 6' (1.829 m)   Wt 212 lb (96.2 kg)   BMI 28.75 kg/m   Constitutional:  Alert and oriented, No acute distress. HEENT: [place] AT,  moist mucus membranes.  Trachea midline, no masses. Cardiovascular: No clubbing, cyanosis, or edema. Respiratory: Normal respiratory effort, no increased work of breathing. Skin: No rashes, bruises or suspicious lesions.  [place] complexion. Neurologic: Grossly intact, no focal deficits, moving all 4 extremities. Psychiatric: Normal mood and affect.  Laboratory Data: Lab Results  Component Value Date   WBC 5.6 10/21/2017   HGB 15.1 10/21/2017   HCT 45.4 10/21/2017   MCV 89.0 10/21/2017   PLT 157 10/21/2017    Lab Results  Component Value Date   CREATININE 0.81 10/21/2017    Lab Results  Component Value Date   PSA 0.05 10/09/2016   PSA 0.01 05/09/2016   PSA 7.78 (H) 08/28/2015    Urinalysis N/a  Pertinent Imaging: n/a  Assessment & Plan:    1. History of prostate cancer S/p brachytherapy 2017 PSA remains quite low I recommended annual PSA either by myself or Dr. Baruch Gouty No voiding symptoms   Return in about 1 year (around 05/16/2019) for  PSA.  Hollice Espy, MD  [facility] [address] [place], [place] [address] [phone]

## 2018-05-18 ENCOUNTER — Telehealth: Payer: Self-pay

## 2018-05-18 NOTE — Telephone Encounter (Signed)
Pt informed

## 2018-05-18 NOTE — Telephone Encounter (Signed)
-----   Message from Hollice Espy, MD sent at 05/17/2018  2:07 PM EDT ----- PSA is stable.  Excellent news.  Hollice Espy, MD

## 2018-05-25 DIAGNOSIS — I10 Essential (primary) hypertension: Secondary | ICD-10-CM | POA: Diagnosis not present

## 2018-05-25 DIAGNOSIS — F1021 Alcohol dependence, in remission: Secondary | ICD-10-CM | POA: Diagnosis not present

## 2018-05-25 DIAGNOSIS — F17200 Nicotine dependence, unspecified, uncomplicated: Secondary | ICD-10-CM | POA: Diagnosis not present

## 2018-05-25 DIAGNOSIS — E7800 Pure hypercholesterolemia, unspecified: Secondary | ICD-10-CM | POA: Diagnosis not present

## 2018-05-25 DIAGNOSIS — J449 Chronic obstructive pulmonary disease, unspecified: Secondary | ICD-10-CM | POA: Diagnosis not present

## 2018-05-29 DIAGNOSIS — R0902 Hypoxemia: Secondary | ICD-10-CM | POA: Diagnosis not present

## 2018-05-29 DIAGNOSIS — J439 Emphysema, unspecified: Secondary | ICD-10-CM | POA: Diagnosis not present

## 2018-05-29 DIAGNOSIS — R0609 Other forms of dyspnea: Secondary | ICD-10-CM | POA: Diagnosis not present

## 2018-06-09 DIAGNOSIS — J449 Chronic obstructive pulmonary disease, unspecified: Secondary | ICD-10-CM | POA: Diagnosis not present

## 2018-06-09 DIAGNOSIS — R0609 Other forms of dyspnea: Secondary | ICD-10-CM | POA: Diagnosis not present

## 2018-06-23 DIAGNOSIS — Z23 Encounter for immunization: Secondary | ICD-10-CM | POA: Diagnosis not present

## 2018-06-23 DIAGNOSIS — Z0000 Encounter for general adult medical examination without abnormal findings: Secondary | ICD-10-CM | POA: Diagnosis not present

## 2018-06-23 DIAGNOSIS — R7303 Prediabetes: Secondary | ICD-10-CM | POA: Diagnosis not present

## 2018-06-23 DIAGNOSIS — I10 Essential (primary) hypertension: Secondary | ICD-10-CM | POA: Diagnosis not present

## 2018-08-03 DIAGNOSIS — R0609 Other forms of dyspnea: Secondary | ICD-10-CM | POA: Diagnosis not present

## 2018-08-03 DIAGNOSIS — G4733 Obstructive sleep apnea (adult) (pediatric): Secondary | ICD-10-CM | POA: Diagnosis not present

## 2018-08-07 DIAGNOSIS — J449 Chronic obstructive pulmonary disease, unspecified: Secondary | ICD-10-CM | POA: Diagnosis not present

## 2018-09-06 DIAGNOSIS — J449 Chronic obstructive pulmonary disease, unspecified: Secondary | ICD-10-CM | POA: Diagnosis not present

## 2018-09-16 DIAGNOSIS — R7303 Prediabetes: Secondary | ICD-10-CM | POA: Diagnosis not present

## 2018-09-23 DIAGNOSIS — I10 Essential (primary) hypertension: Secondary | ICD-10-CM | POA: Diagnosis not present

## 2018-09-23 DIAGNOSIS — R7303 Prediabetes: Secondary | ICD-10-CM | POA: Diagnosis not present

## 2018-10-07 DIAGNOSIS — R0902 Hypoxemia: Secondary | ICD-10-CM | POA: Diagnosis not present

## 2018-10-07 DIAGNOSIS — G4733 Obstructive sleep apnea (adult) (pediatric): Secondary | ICD-10-CM | POA: Diagnosis not present

## 2018-10-07 DIAGNOSIS — Z720 Tobacco use: Secondary | ICD-10-CM | POA: Diagnosis not present

## 2018-10-07 DIAGNOSIS — J449 Chronic obstructive pulmonary disease, unspecified: Secondary | ICD-10-CM | POA: Diagnosis not present

## 2018-10-07 DIAGNOSIS — J439 Emphysema, unspecified: Secondary | ICD-10-CM | POA: Diagnosis not present

## 2018-10-07 DIAGNOSIS — R0609 Other forms of dyspnea: Secondary | ICD-10-CM | POA: Diagnosis not present

## 2018-10-07 DIAGNOSIS — Z9989 Dependence on other enabling machines and devices: Secondary | ICD-10-CM | POA: Diagnosis not present

## 2018-10-07 DIAGNOSIS — R0602 Shortness of breath: Secondary | ICD-10-CM | POA: Diagnosis not present

## 2018-10-16 ENCOUNTER — Other Ambulatory Visit: Payer: Self-pay | Admitting: Specialist

## 2018-10-16 DIAGNOSIS — R0609 Other forms of dyspnea: Secondary | ICD-10-CM

## 2018-10-16 DIAGNOSIS — Z720 Tobacco use: Secondary | ICD-10-CM

## 2018-10-16 DIAGNOSIS — J439 Emphysema, unspecified: Secondary | ICD-10-CM

## 2018-10-16 DIAGNOSIS — R0902 Hypoxemia: Secondary | ICD-10-CM

## 2018-10-16 DIAGNOSIS — R0602 Shortness of breath: Secondary | ICD-10-CM

## 2018-10-23 ENCOUNTER — Ambulatory Visit
Admission: RE | Admit: 2018-10-23 | Discharge: 2018-10-23 | Disposition: A | Discharge status: Home / Self Care | Payer: Medicare HMO | Source: Ambulatory Visit | Attending: Specialist | Admitting: Specialist

## 2018-10-23 DIAGNOSIS — J439 Emphysema, unspecified: Secondary | ICD-10-CM | POA: Diagnosis not present

## 2018-10-23 DIAGNOSIS — Z720 Tobacco use: Secondary | ICD-10-CM | POA: Diagnosis not present

## 2018-10-23 DIAGNOSIS — R0609 Other forms of dyspnea: Secondary | ICD-10-CM | POA: Diagnosis not present

## 2018-10-23 DIAGNOSIS — J432 Centrilobular emphysema: Secondary | ICD-10-CM | POA: Diagnosis not present

## 2018-10-23 DIAGNOSIS — R0602 Shortness of breath: Secondary | ICD-10-CM | POA: Diagnosis not present

## 2018-10-23 DIAGNOSIS — R0902 Hypoxemia: Secondary | ICD-10-CM

## 2018-11-07 DIAGNOSIS — J449 Chronic obstructive pulmonary disease, unspecified: Secondary | ICD-10-CM | POA: Diagnosis not present

## 2018-11-11 ENCOUNTER — Inpatient Hospital Stay: Payer: Medicare HMO

## 2018-11-13 ENCOUNTER — Inpatient Hospital Stay: Payer: Medicare HMO | Attending: Radiation Oncology

## 2018-11-13 DIAGNOSIS — C61 Malignant neoplasm of prostate: Secondary | ICD-10-CM | POA: Insufficient documentation

## 2018-11-13 LAB — PSA: Prostatic Specific Antigen: 0.05 ng/mL (ref 0.00–4.00)

## 2018-11-18 ENCOUNTER — Encounter: Payer: Self-pay | Admitting: Radiation Oncology

## 2018-11-18 ENCOUNTER — Other Ambulatory Visit: Payer: Self-pay

## 2018-11-18 ENCOUNTER — Ambulatory Visit
Admission: RE | Admit: 2018-11-18 | Discharge: 2018-11-18 | Disposition: A | Discharge status: Home / Self Care | Payer: Medicare HMO | Source: Ambulatory Visit | Attending: Radiation Oncology | Admitting: Radiation Oncology

## 2018-11-18 VITALS — BP 123/83 | HR 116 | Temp 96.4°F | Resp 22 | Wt 248.5 lb

## 2018-11-18 DIAGNOSIS — Z923 Personal history of irradiation: Secondary | ICD-10-CM | POA: Insufficient documentation

## 2018-11-18 DIAGNOSIS — C61 Malignant neoplasm of prostate: Secondary | ICD-10-CM | POA: Diagnosis not present

## 2018-11-18 NOTE — Progress Notes (Signed)
Radiation Oncology Follow up Note  Name: Robert Powell   Date:   11/18/2018 MRN:  [id number] DOB: [birth year]    This 68 y.o. male presents to the clinic today for 3 year follow-up status post I-125 interstitial implant for adenocarcinoma the prostate.  REFERRING PROVIDER: Glendon Axe, MD  HPI: patient is a 68 year old male now out 3 years having completed I-125 interstitial implant for a Gleason 6 adenocarcinoma the prostate.He is seen today in routine follow-up is doing well specifically denies any increased lower urinary tract symptoms diarrhea or fatigue. He is on chronic nasal oxygen. Most recent PSA performed this month was 0.05..he had a recent CT scan showing small subcentimeter bilateral pulmonary nodules largest measuring over 5 mm. 1 year follow-up CT scan has been recommended.  COMPLICATIONS OF TREATMENT: none  FOLLOW UP COMPLIANCE: keeps appointments   PHYSICAL EXAM:  BP 123/83 (BP Location: Left Arm, Patient Position: Sitting)   Pulse (!) 116   Temp (!) 96.4 F (35.8 C) (Tympanic)   Resp (!) 22   Wt 248 lb 7.3 oz (112.7 kg)   BMI 33.70 kg/m  Well-developed well-nourished patient in NAD. HEENT reveals PERLA, EOMI, discs not visualized.  Oral cavity is clear. No oral mucosal lesions are identified. Neck is clear without evidence of cervical or supraclavicular adenopathy. Lungs are clear to A&P. Cardiac examination is essentially unremarkable with regular rate and rhythm without murmur rub or thrill. Abdomen is benign with no organomegaly or masses noted. Motor sensory and DTR levels are equal and symmetric in the upper and lower extremities. Cranial nerves II through XII are grossly intact. Proprioception is intact. No peripheral adenopathy or edema is identified. No motor or sensory levels are noted. Crude visual fields are within normal range.  RADIOLOGY RESULTS: CT scan is reviewed and compatible with the above-stated findings as well as recommendations for 1 year  follow-up CT scan  PLAN: present time patient is doing well under excellent biochemical control of his prostate cancer. I'm please was overall progress. Will make sure he has a follow-up CT scan in 1 year. I've asked to see him back in 1 year for follow-up. He continues follow-up care with urology. Patient knows to call with any concerns.  I would like to take this opportunity to thank you for allowing me to participate in the care of your patient.Noreene Filbert, MD

## 2018-12-02 DIAGNOSIS — J432 Centrilobular emphysema: Secondary | ICD-10-CM | POA: Diagnosis not present

## 2018-12-02 DIAGNOSIS — G4733 Obstructive sleep apnea (adult) (pediatric): Secondary | ICD-10-CM | POA: Diagnosis not present

## 2018-12-02 DIAGNOSIS — R0609 Other forms of dyspnea: Secondary | ICD-10-CM | POA: Diagnosis not present

## 2018-12-02 DIAGNOSIS — Z9981 Dependence on supplemental oxygen: Secondary | ICD-10-CM | POA: Diagnosis not present

## 2018-12-06 DIAGNOSIS — J449 Chronic obstructive pulmonary disease, unspecified: Secondary | ICD-10-CM | POA: Diagnosis not present

## 2018-12-18 IMAGING — CR DG CHEST 2V
1 series · 2 of 2 positions shown · non-contrast
Comparison: CT scan of August 18, 2015.

CLINICAL DATA: Shortness of breath.

EXAM:
CHEST  2 VIEW

[Series 1: dg chest 2 view · 0.14mm/px · 2 of 2 slices shown]
[im 1/2]
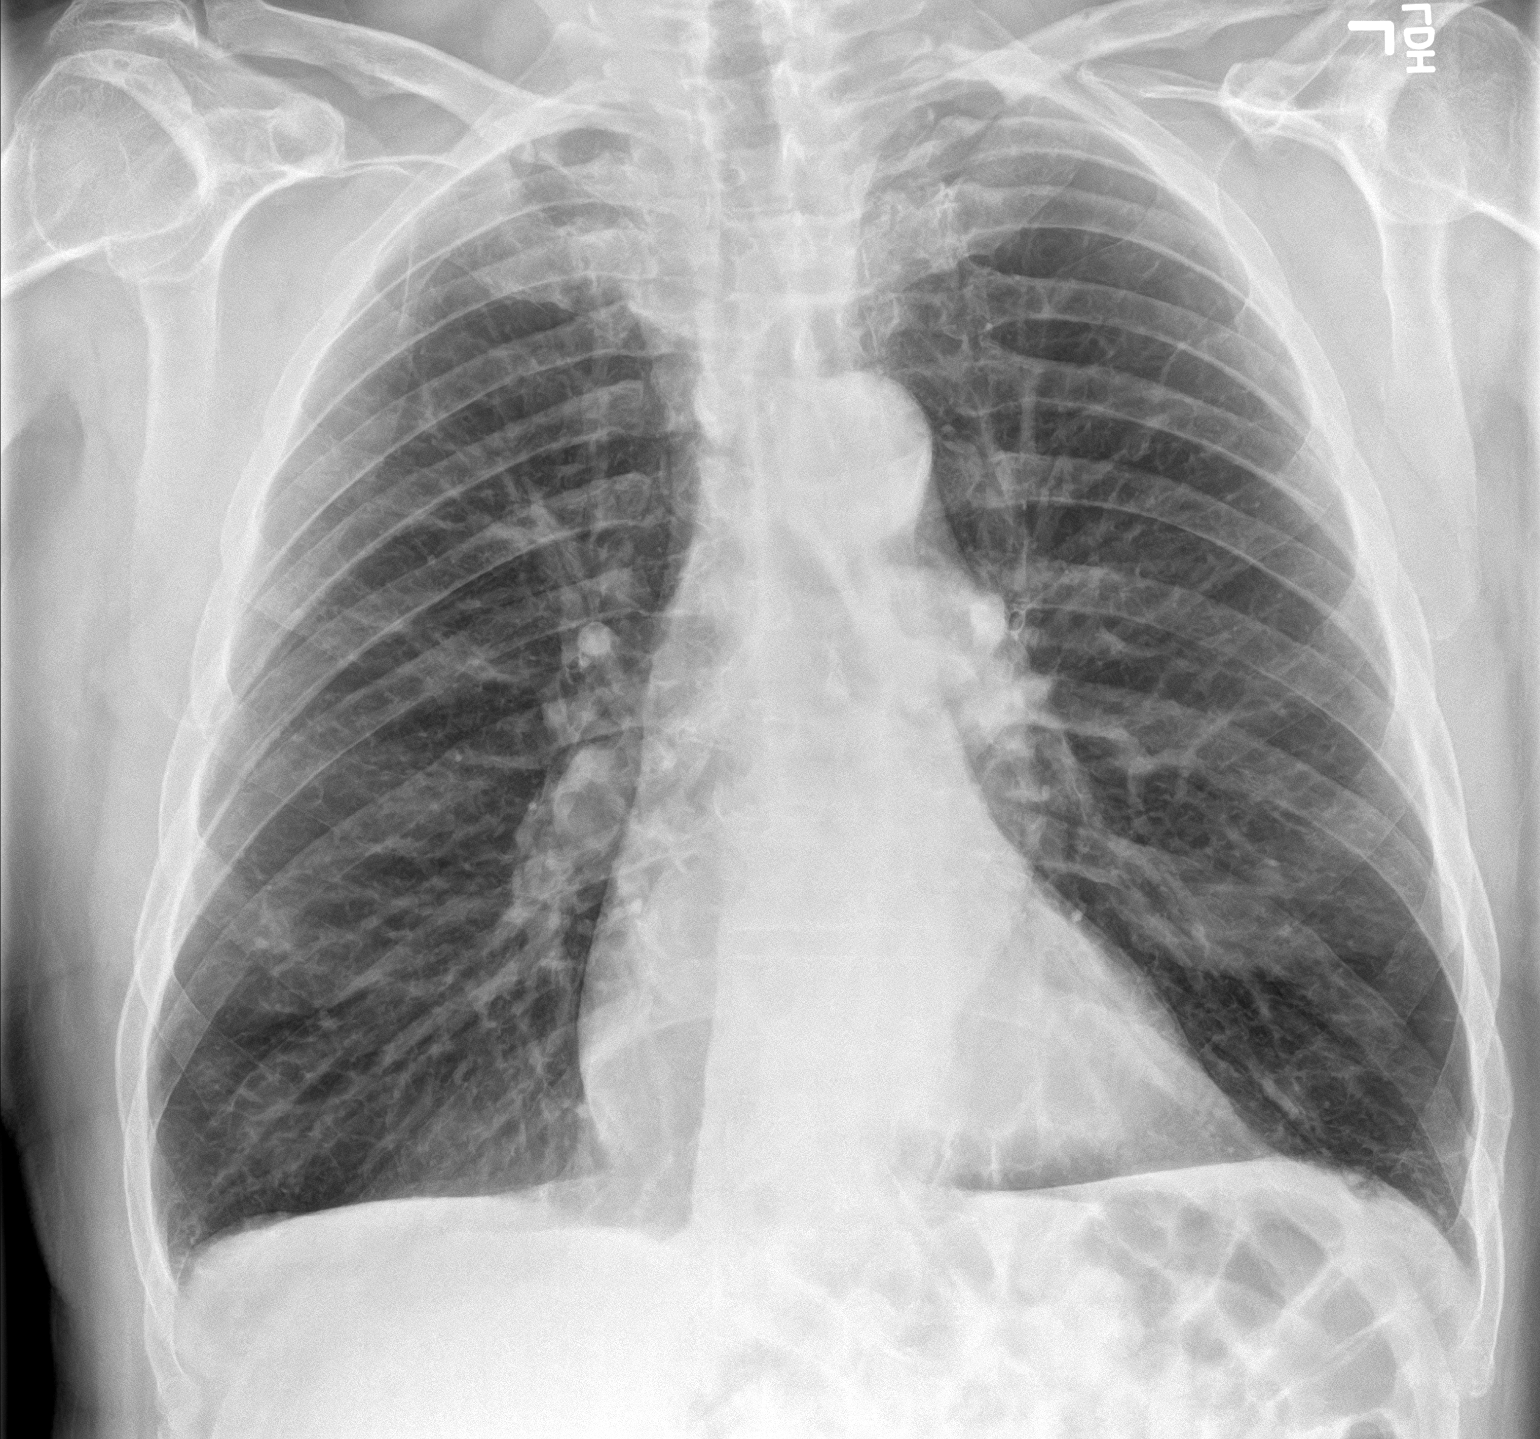
[im 2/2]
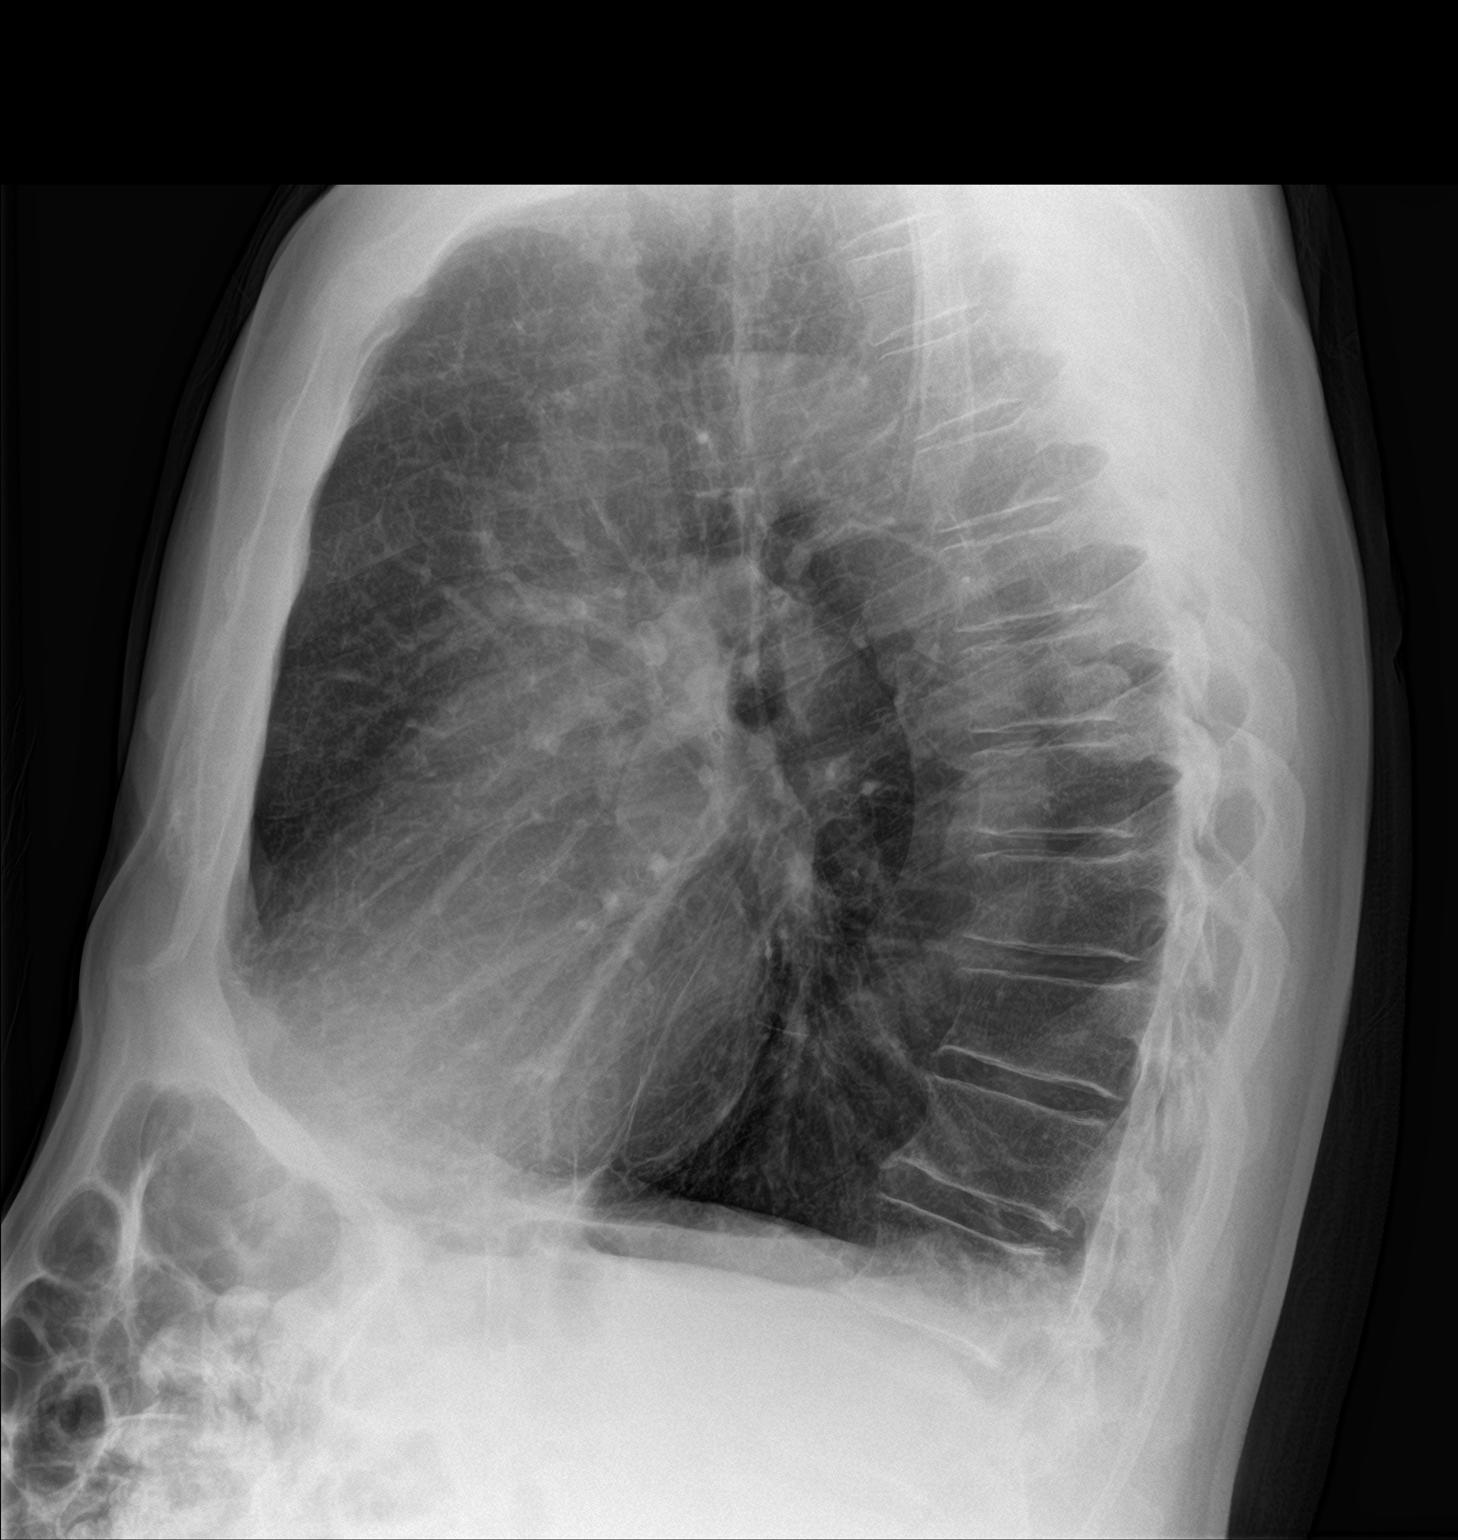

[2 of 2 positions shown; findings below may reference images not displayed]

FINDINGS: The heart size and mediastinal contours are within normal limits. No
pneumothorax or pleural effusion is noted. Lungs are clear. The
visualized skeletal structures are unremarkable.
IMPRESSION: No active cardiopulmonary disease.

## 2018-12-25 DIAGNOSIS — J449 Chronic obstructive pulmonary disease, unspecified: Secondary | ICD-10-CM | POA: Diagnosis not present

## 2019-01-06 DIAGNOSIS — J449 Chronic obstructive pulmonary disease, unspecified: Secondary | ICD-10-CM | POA: Diagnosis not present

## 2019-02-05 DIAGNOSIS — J449 Chronic obstructive pulmonary disease, unspecified: Secondary | ICD-10-CM | POA: Diagnosis not present

## 2019-02-25 DIAGNOSIS — J432 Centrilobular emphysema: Secondary | ICD-10-CM | POA: Diagnosis not present

## 2019-03-08 DIAGNOSIS — J449 Chronic obstructive pulmonary disease, unspecified: Secondary | ICD-10-CM | POA: Diagnosis not present

## 2019-03-18 DIAGNOSIS — I10 Essential (primary) hypertension: Secondary | ICD-10-CM | POA: Diagnosis not present

## 2019-03-18 DIAGNOSIS — R7303 Prediabetes: Secondary | ICD-10-CM | POA: Diagnosis not present

## 2019-03-25 DIAGNOSIS — J9611 Chronic respiratory failure with hypoxia: Secondary | ICD-10-CM | POA: Insufficient documentation

## 2019-03-25 DIAGNOSIS — G4733 Obstructive sleep apnea (adult) (pediatric): Secondary | ICD-10-CM | POA: Diagnosis not present

## 2019-03-25 DIAGNOSIS — R7303 Prediabetes: Secondary | ICD-10-CM | POA: Diagnosis not present

## 2019-03-25 DIAGNOSIS — I482 Chronic atrial fibrillation, unspecified: Secondary | ICD-10-CM | POA: Diagnosis not present

## 2019-03-25 DIAGNOSIS — I2510 Atherosclerotic heart disease of native coronary artery without angina pectoris: Secondary | ICD-10-CM | POA: Diagnosis not present

## 2019-03-25 DIAGNOSIS — E782 Mixed hyperlipidemia: Secondary | ICD-10-CM | POA: Diagnosis not present

## 2019-03-25 DIAGNOSIS — Z0000 Encounter for general adult medical examination without abnormal findings: Secondary | ICD-10-CM | POA: Diagnosis not present

## 2019-03-25 DIAGNOSIS — I10 Essential (primary) hypertension: Secondary | ICD-10-CM | POA: Diagnosis not present

## 2019-03-25 DIAGNOSIS — J432 Centrilobular emphysema: Secondary | ICD-10-CM | POA: Diagnosis not present

## 2019-03-25 DIAGNOSIS — I700 Atherosclerosis of aorta: Secondary | ICD-10-CM | POA: Diagnosis not present

## 2019-03-31 DIAGNOSIS — J432 Centrilobular emphysema: Secondary | ICD-10-CM | POA: Diagnosis not present

## 2019-04-01 DIAGNOSIS — G4733 Obstructive sleep apnea (adult) (pediatric): Secondary | ICD-10-CM | POA: Diagnosis not present

## 2019-04-01 DIAGNOSIS — Z9981 Dependence on supplemental oxygen: Secondary | ICD-10-CM | POA: Diagnosis not present

## 2019-04-01 DIAGNOSIS — J449 Chronic obstructive pulmonary disease, unspecified: Secondary | ICD-10-CM | POA: Diagnosis not present

## 2019-04-01 DIAGNOSIS — Z9989 Dependence on other enabling machines and devices: Secondary | ICD-10-CM | POA: Diagnosis not present

## 2019-04-01 DIAGNOSIS — R918 Other nonspecific abnormal finding of lung field: Secondary | ICD-10-CM | POA: Diagnosis not present

## 2019-04-07 DIAGNOSIS — J449 Chronic obstructive pulmonary disease, unspecified: Secondary | ICD-10-CM | POA: Diagnosis not present

## 2019-04-09 ENCOUNTER — Other Ambulatory Visit: Payer: Self-pay | Admitting: Specialist

## 2019-04-09 DIAGNOSIS — R918 Other nonspecific abnormal finding of lung field: Secondary | ICD-10-CM

## 2019-04-23 ENCOUNTER — Ambulatory Visit: Payer: Self-pay | Admitting: Urology

## 2019-04-23 ENCOUNTER — Ambulatory Visit: Payer: Medicare HMO | Admitting: Urology

## 2019-05-01 DIAGNOSIS — J432 Centrilobular emphysema: Secondary | ICD-10-CM | POA: Diagnosis not present

## 2019-05-08 DIAGNOSIS — J449 Chronic obstructive pulmonary disease, unspecified: Secondary | ICD-10-CM | POA: Diagnosis not present

## 2019-06-01 DIAGNOSIS — J432 Centrilobular emphysema: Secondary | ICD-10-CM | POA: Diagnosis not present

## 2019-06-08 DIAGNOSIS — J449 Chronic obstructive pulmonary disease, unspecified: Secondary | ICD-10-CM | POA: Diagnosis not present

## 2019-07-01 DIAGNOSIS — J432 Centrilobular emphysema: Secondary | ICD-10-CM | POA: Diagnosis not present

## 2019-07-08 DIAGNOSIS — J449 Chronic obstructive pulmonary disease, unspecified: Secondary | ICD-10-CM | POA: Diagnosis not present

## 2019-07-21 DIAGNOSIS — Z87891 Personal history of nicotine dependence: Secondary | ICD-10-CM | POA: Diagnosis not present

## 2019-07-21 DIAGNOSIS — R600 Localized edema: Secondary | ICD-10-CM | POA: Diagnosis not present

## 2019-08-01 DIAGNOSIS — J432 Centrilobular emphysema: Secondary | ICD-10-CM | POA: Diagnosis not present

## 2019-08-08 DIAGNOSIS — J449 Chronic obstructive pulmonary disease, unspecified: Secondary | ICD-10-CM | POA: Diagnosis not present

## 2019-08-18 DIAGNOSIS — I482 Chronic atrial fibrillation, unspecified: Secondary | ICD-10-CM | POA: Diagnosis not present

## 2019-08-18 DIAGNOSIS — E782 Mixed hyperlipidemia: Secondary | ICD-10-CM | POA: Diagnosis not present

## 2019-08-18 DIAGNOSIS — I700 Atherosclerosis of aorta: Secondary | ICD-10-CM | POA: Diagnosis not present

## 2019-08-18 DIAGNOSIS — I10 Essential (primary) hypertension: Secondary | ICD-10-CM | POA: Diagnosis not present

## 2019-08-18 DIAGNOSIS — I2510 Atherosclerotic heart disease of native coronary artery without angina pectoris: Secondary | ICD-10-CM | POA: Diagnosis not present

## 2019-08-18 DIAGNOSIS — Z9861 Coronary angioplasty status: Secondary | ICD-10-CM | POA: Diagnosis not present

## 2019-08-18 DIAGNOSIS — I6523 Occlusion and stenosis of bilateral carotid arteries: Secondary | ICD-10-CM | POA: Diagnosis not present

## 2019-08-31 DIAGNOSIS — J432 Centrilobular emphysema: Secondary | ICD-10-CM | POA: Diagnosis not present

## 2019-09-07 DIAGNOSIS — J449 Chronic obstructive pulmonary disease, unspecified: Secondary | ICD-10-CM | POA: Diagnosis not present

## 2019-11-01 DIAGNOSIS — J432 Centrilobular emphysema: Secondary | ICD-10-CM | POA: Diagnosis not present

## 2019-11-07 DIAGNOSIS — J449 Chronic obstructive pulmonary disease, unspecified: Secondary | ICD-10-CM | POA: Diagnosis not present

## 2019-11-09 ENCOUNTER — Other Ambulatory Visit: Payer: Self-pay

## 2019-11-10 ENCOUNTER — Other Ambulatory Visit: Payer: Self-pay

## 2019-11-10 ENCOUNTER — Inpatient Hospital Stay: Payer: Medicare HMO | Attending: Radiation Oncology

## 2019-11-10 DIAGNOSIS — C61 Malignant neoplasm of prostate: Secondary | ICD-10-CM | POA: Diagnosis not present

## 2019-11-10 LAB — PSA: Prostatic Specific Antigen: 0.04 ng/mL (ref 0.00–4.00)

## 2019-11-11 DIAGNOSIS — Z1331 Encounter for screening for depression: Secondary | ICD-10-CM | POA: Diagnosis not present

## 2019-11-11 DIAGNOSIS — I272 Pulmonary hypertension, unspecified: Secondary | ICD-10-CM | POA: Diagnosis not present

## 2019-11-11 DIAGNOSIS — R0600 Dyspnea, unspecified: Secondary | ICD-10-CM | POA: Diagnosis not present

## 2019-11-11 DIAGNOSIS — R918 Other nonspecific abnormal finding of lung field: Secondary | ICD-10-CM | POA: Diagnosis not present

## 2019-11-11 DIAGNOSIS — G4733 Obstructive sleep apnea (adult) (pediatric): Secondary | ICD-10-CM | POA: Diagnosis not present

## 2019-11-16 ENCOUNTER — Other Ambulatory Visit: Payer: Self-pay

## 2019-11-17 ENCOUNTER — Other Ambulatory Visit: Payer: Self-pay

## 2019-11-17 ENCOUNTER — Ambulatory Visit
Admission: RE | Admit: 2019-11-17 | Discharge: 2019-11-17 | Disposition: A | Discharge status: Home / Self Care | Payer: Medicare HMO | Source: Ambulatory Visit | Attending: Radiation Oncology | Admitting: Radiation Oncology

## 2019-11-17 VITALS — BP 120/78 | HR 118 | Temp 98.0°F | Resp 20 | Wt 273.5 lb

## 2019-11-17 DIAGNOSIS — C61 Malignant neoplasm of prostate: Secondary | ICD-10-CM

## 2019-11-17 NOTE — Progress Notes (Signed)
Radiation Oncology Follow up Note  Name: Robert Powell   Date:   11/17/2019 MRN:  [id number] DOB: [birth year]    This 69 y.o. male presents to the clinic today for 4-year follow-up status post I-125 interstitial implant for adenocarcinoma of the prostate Gleason score of 6.  REFERRING PROVIDER: Glendon Axe, MD  HPI: Patient is a 69-year-old male now out over 4 years having completed I-125 interstitial implant for Gleason 6 adenocarcinoma the prostate.  Seen today in routine follow-up he is doing fine from a urologic standpoint specifically denies any increased lower urinary tract symptoms diarrhea or fatigue he has severe emphysema COPD is on nasal oxygen.  His most recent PSA.  Is 0.04 down slightly from 0.0571-year prior.  He is under excellent biochemical control of his prostate cancer.  COMPLICATIONS OF TREATMENT: none  FOLLOW UP COMPLIANCE: keeps appointments   PHYSICAL EXAM:  BP 120/78   Pulse (!) 118   Temp 98 F (36.7 C)   Resp 20   Wt 273 lb 8 oz (124.1 kg)   SpO2 95%   BMI 37.09 kg/m  Wheelchair-bound male on nasal oxygen in NAD.  Well-developed well-nourished patient in NAD. HEENT reveals PERLA, EOMI, discs not visualized.  Oral cavity is clear. No oral mucosal lesions are identified. Neck is clear without evidence of cervical or supraclavicular adenopathy. Lungs are clear to A&P. Cardiac examination is essentially unremarkable with regular rate and rhythm without murmur rub or thrill. Abdomen is benign with no organomegaly or masses noted. Motor sensory and DTR levels are equal and symmetric in the upper and lower extremities. Cranial nerves II through XII are grossly intact. Proprioception is intact. No peripheral adenopathy or edema is identified. No motor or sensory levels are noted. Crude visual fields are within normal range.  RADIOLOGY RESULTS: No current films for review  PLAN: Present time patient is under excellent biochemical control of his prostate cancer.  At  this time I am going to discontinue follow-up care of asked his PMD to resume yearly PSA levels.  I would be happy to reevaluate the patient anytime the future should that be indicated.  I would like to take this opportunity to thank you for allowing me to participate in the care of your patient.Noreene Filbert, MD

## 2019-11-29 DIAGNOSIS — J432 Centrilobular emphysema: Secondary | ICD-10-CM | POA: Diagnosis not present

## 2019-12-06 DIAGNOSIS — J449 Chronic obstructive pulmonary disease, unspecified: Secondary | ICD-10-CM | POA: Diagnosis not present

## 2019-12-30 DIAGNOSIS — J432 Centrilobular emphysema: Secondary | ICD-10-CM | POA: Diagnosis not present

## 2020-01-06 DIAGNOSIS — J449 Chronic obstructive pulmonary disease, unspecified: Secondary | ICD-10-CM | POA: Diagnosis not present

## 2020-01-29 DIAGNOSIS — J432 Centrilobular emphysema: Secondary | ICD-10-CM | POA: Diagnosis not present

## 2020-02-05 DIAGNOSIS — J449 Chronic obstructive pulmonary disease, unspecified: Secondary | ICD-10-CM | POA: Diagnosis not present

## 2020-02-18 DIAGNOSIS — J449 Chronic obstructive pulmonary disease, unspecified: Secondary | ICD-10-CM | POA: Diagnosis not present

## 2020-02-29 DIAGNOSIS — J432 Centrilobular emphysema: Secondary | ICD-10-CM | POA: Diagnosis not present

## 2020-03-07 DIAGNOSIS — J449 Chronic obstructive pulmonary disease, unspecified: Secondary | ICD-10-CM | POA: Diagnosis not present

## 2020-03-30 DIAGNOSIS — J432 Centrilobular emphysema: Secondary | ICD-10-CM | POA: Diagnosis not present

## 2020-04-06 DIAGNOSIS — J449 Chronic obstructive pulmonary disease, unspecified: Secondary | ICD-10-CM | POA: Diagnosis not present

## 2020-04-14 DIAGNOSIS — J449 Chronic obstructive pulmonary disease, unspecified: Secondary | ICD-10-CM | POA: Diagnosis not present

## 2020-04-30 DIAGNOSIS — J432 Centrilobular emphysema: Secondary | ICD-10-CM | POA: Diagnosis not present

## 2020-05-07 DIAGNOSIS — J449 Chronic obstructive pulmonary disease, unspecified: Secondary | ICD-10-CM | POA: Diagnosis not present

## 2020-05-31 DIAGNOSIS — J432 Centrilobular emphysema: Secondary | ICD-10-CM | POA: Diagnosis not present

## 2020-06-07 DIAGNOSIS — J449 Chronic obstructive pulmonary disease, unspecified: Secondary | ICD-10-CM | POA: Diagnosis not present

## 2020-06-23 DIAGNOSIS — I10 Essential (primary) hypertension: Secondary | ICD-10-CM | POA: Diagnosis not present

## 2020-06-23 DIAGNOSIS — J9611 Chronic respiratory failure with hypoxia: Secondary | ICD-10-CM | POA: Diagnosis not present

## 2020-06-23 DIAGNOSIS — J432 Centrilobular emphysema: Secondary | ICD-10-CM | POA: Diagnosis not present

## 2020-06-30 DIAGNOSIS — J432 Centrilobular emphysema: Secondary | ICD-10-CM | POA: Diagnosis not present

## 2020-07-02 DIAGNOSIS — I10 Essential (primary) hypertension: Secondary | ICD-10-CM | POA: Diagnosis not present

## 2020-07-02 DIAGNOSIS — C61 Malignant neoplasm of prostate: Secondary | ICD-10-CM | POA: Diagnosis not present

## 2020-07-02 DIAGNOSIS — I700 Atherosclerosis of aorta: Secondary | ICD-10-CM | POA: Diagnosis not present

## 2020-07-02 DIAGNOSIS — J9611 Chronic respiratory failure with hypoxia: Secondary | ICD-10-CM | POA: Diagnosis not present

## 2020-07-02 DIAGNOSIS — I2510 Atherosclerotic heart disease of native coronary artery without angina pectoris: Secondary | ICD-10-CM | POA: Diagnosis not present

## 2020-07-02 DIAGNOSIS — I361 Nonrheumatic tricuspid (valve) insufficiency: Secondary | ICD-10-CM | POA: Diagnosis not present

## 2020-07-02 DIAGNOSIS — I482 Chronic atrial fibrillation, unspecified: Secondary | ICD-10-CM | POA: Diagnosis not present

## 2020-07-02 DIAGNOSIS — I6523 Occlusion and stenosis of bilateral carotid arteries: Secondary | ICD-10-CM | POA: Diagnosis not present

## 2020-07-02 DIAGNOSIS — J432 Centrilobular emphysema: Secondary | ICD-10-CM | POA: Diagnosis not present

## 2020-07-04 DIAGNOSIS — J432 Centrilobular emphysema: Secondary | ICD-10-CM | POA: Diagnosis not present

## 2020-07-04 DIAGNOSIS — I6523 Occlusion and stenosis of bilateral carotid arteries: Secondary | ICD-10-CM | POA: Diagnosis not present

## 2020-07-04 DIAGNOSIS — I482 Chronic atrial fibrillation, unspecified: Secondary | ICD-10-CM | POA: Diagnosis not present

## 2020-07-04 DIAGNOSIS — I10 Essential (primary) hypertension: Secondary | ICD-10-CM | POA: Diagnosis not present

## 2020-07-04 DIAGNOSIS — I2510 Atherosclerotic heart disease of native coronary artery without angina pectoris: Secondary | ICD-10-CM | POA: Diagnosis not present

## 2020-07-04 DIAGNOSIS — C61 Malignant neoplasm of prostate: Secondary | ICD-10-CM | POA: Diagnosis not present

## 2020-07-04 DIAGNOSIS — I700 Atherosclerosis of aorta: Secondary | ICD-10-CM | POA: Diagnosis not present

## 2020-07-04 DIAGNOSIS — I361 Nonrheumatic tricuspid (valve) insufficiency: Secondary | ICD-10-CM | POA: Diagnosis not present

## 2020-07-04 DIAGNOSIS — J9611 Chronic respiratory failure with hypoxia: Secondary | ICD-10-CM | POA: Diagnosis not present

## 2020-07-05 DIAGNOSIS — I361 Nonrheumatic tricuspid (valve) insufficiency: Secondary | ICD-10-CM | POA: Diagnosis not present

## 2020-07-05 DIAGNOSIS — J432 Centrilobular emphysema: Secondary | ICD-10-CM | POA: Diagnosis not present

## 2020-07-05 DIAGNOSIS — I2510 Atherosclerotic heart disease of native coronary artery without angina pectoris: Secondary | ICD-10-CM | POA: Diagnosis not present

## 2020-07-05 DIAGNOSIS — C61 Malignant neoplasm of prostate: Secondary | ICD-10-CM | POA: Diagnosis not present

## 2020-07-05 DIAGNOSIS — I6523 Occlusion and stenosis of bilateral carotid arteries: Secondary | ICD-10-CM | POA: Diagnosis not present

## 2020-07-05 DIAGNOSIS — I10 Essential (primary) hypertension: Secondary | ICD-10-CM | POA: Diagnosis not present

## 2020-07-05 DIAGNOSIS — J9611 Chronic respiratory failure with hypoxia: Secondary | ICD-10-CM | POA: Diagnosis not present

## 2020-07-05 DIAGNOSIS — I700 Atherosclerosis of aorta: Secondary | ICD-10-CM | POA: Diagnosis not present

## 2020-07-05 DIAGNOSIS — I482 Chronic atrial fibrillation, unspecified: Secondary | ICD-10-CM | POA: Diagnosis not present

## 2020-07-07 DIAGNOSIS — I2510 Atherosclerotic heart disease of native coronary artery without angina pectoris: Secondary | ICD-10-CM | POA: Diagnosis not present

## 2020-07-07 DIAGNOSIS — I6523 Occlusion and stenosis of bilateral carotid arteries: Secondary | ICD-10-CM | POA: Diagnosis not present

## 2020-07-07 DIAGNOSIS — I361 Nonrheumatic tricuspid (valve) insufficiency: Secondary | ICD-10-CM | POA: Diagnosis not present

## 2020-07-07 DIAGNOSIS — J9611 Chronic respiratory failure with hypoxia: Secondary | ICD-10-CM | POA: Diagnosis not present

## 2020-07-07 DIAGNOSIS — I10 Essential (primary) hypertension: Secondary | ICD-10-CM | POA: Diagnosis not present

## 2020-07-07 DIAGNOSIS — J432 Centrilobular emphysema: Secondary | ICD-10-CM | POA: Diagnosis not present

## 2020-07-07 DIAGNOSIS — I482 Chronic atrial fibrillation, unspecified: Secondary | ICD-10-CM | POA: Diagnosis not present

## 2020-07-07 DIAGNOSIS — C61 Malignant neoplasm of prostate: Secondary | ICD-10-CM | POA: Diagnosis not present

## 2020-07-07 DIAGNOSIS — I700 Atherosclerosis of aorta: Secondary | ICD-10-CM | POA: Diagnosis not present

## 2020-07-07 DIAGNOSIS — J449 Chronic obstructive pulmonary disease, unspecified: Secondary | ICD-10-CM | POA: Diagnosis not present

## 2020-07-11 DIAGNOSIS — I482 Chronic atrial fibrillation, unspecified: Secondary | ICD-10-CM | POA: Diagnosis not present

## 2020-07-11 DIAGNOSIS — I2510 Atherosclerotic heart disease of native coronary artery without angina pectoris: Secondary | ICD-10-CM | POA: Diagnosis not present

## 2020-07-11 DIAGNOSIS — I10 Essential (primary) hypertension: Secondary | ICD-10-CM | POA: Diagnosis not present

## 2020-07-11 DIAGNOSIS — J9611 Chronic respiratory failure with hypoxia: Secondary | ICD-10-CM | POA: Diagnosis not present

## 2020-07-11 DIAGNOSIS — I700 Atherosclerosis of aorta: Secondary | ICD-10-CM | POA: Diagnosis not present

## 2020-07-11 DIAGNOSIS — I6523 Occlusion and stenosis of bilateral carotid arteries: Secondary | ICD-10-CM | POA: Diagnosis not present

## 2020-07-11 DIAGNOSIS — J432 Centrilobular emphysema: Secondary | ICD-10-CM | POA: Diagnosis not present

## 2020-07-11 DIAGNOSIS — C61 Malignant neoplasm of prostate: Secondary | ICD-10-CM | POA: Diagnosis not present

## 2020-07-11 DIAGNOSIS — I361 Nonrheumatic tricuspid (valve) insufficiency: Secondary | ICD-10-CM | POA: Diagnosis not present

## 2020-07-12 DIAGNOSIS — J432 Centrilobular emphysema: Secondary | ICD-10-CM | POA: Diagnosis not present

## 2020-07-12 DIAGNOSIS — I361 Nonrheumatic tricuspid (valve) insufficiency: Secondary | ICD-10-CM | POA: Diagnosis not present

## 2020-07-12 DIAGNOSIS — I700 Atherosclerosis of aorta: Secondary | ICD-10-CM | POA: Diagnosis not present

## 2020-07-12 DIAGNOSIS — I6523 Occlusion and stenosis of bilateral carotid arteries: Secondary | ICD-10-CM | POA: Diagnosis not present

## 2020-07-12 DIAGNOSIS — J9611 Chronic respiratory failure with hypoxia: Secondary | ICD-10-CM | POA: Diagnosis not present

## 2020-07-12 DIAGNOSIS — I2510 Atherosclerotic heart disease of native coronary artery without angina pectoris: Secondary | ICD-10-CM | POA: Diagnosis not present

## 2020-07-12 DIAGNOSIS — C61 Malignant neoplasm of prostate: Secondary | ICD-10-CM | POA: Diagnosis not present

## 2020-07-12 DIAGNOSIS — I10 Essential (primary) hypertension: Secondary | ICD-10-CM | POA: Diagnosis not present

## 2020-07-12 DIAGNOSIS — I482 Chronic atrial fibrillation, unspecified: Secondary | ICD-10-CM | POA: Diagnosis not present

## 2020-07-13 DIAGNOSIS — J432 Centrilobular emphysema: Secondary | ICD-10-CM | POA: Diagnosis not present

## 2020-07-13 DIAGNOSIS — I6523 Occlusion and stenosis of bilateral carotid arteries: Secondary | ICD-10-CM | POA: Diagnosis not present

## 2020-07-13 DIAGNOSIS — I482 Chronic atrial fibrillation, unspecified: Secondary | ICD-10-CM | POA: Diagnosis not present

## 2020-07-13 DIAGNOSIS — C61 Malignant neoplasm of prostate: Secondary | ICD-10-CM | POA: Diagnosis not present

## 2020-07-13 DIAGNOSIS — J9611 Chronic respiratory failure with hypoxia: Secondary | ICD-10-CM | POA: Diagnosis not present

## 2020-07-13 DIAGNOSIS — I700 Atherosclerosis of aorta: Secondary | ICD-10-CM | POA: Diagnosis not present

## 2020-07-13 DIAGNOSIS — I2510 Atherosclerotic heart disease of native coronary artery without angina pectoris: Secondary | ICD-10-CM | POA: Diagnosis not present

## 2020-07-13 DIAGNOSIS — I10 Essential (primary) hypertension: Secondary | ICD-10-CM | POA: Diagnosis not present

## 2020-07-13 DIAGNOSIS — I361 Nonrheumatic tricuspid (valve) insufficiency: Secondary | ICD-10-CM | POA: Diagnosis not present

## 2020-07-14 DIAGNOSIS — I700 Atherosclerosis of aorta: Secondary | ICD-10-CM | POA: Diagnosis not present

## 2020-07-14 DIAGNOSIS — I482 Chronic atrial fibrillation, unspecified: Secondary | ICD-10-CM | POA: Diagnosis not present

## 2020-07-14 DIAGNOSIS — J9611 Chronic respiratory failure with hypoxia: Secondary | ICD-10-CM | POA: Diagnosis not present

## 2020-07-14 DIAGNOSIS — I6523 Occlusion and stenosis of bilateral carotid arteries: Secondary | ICD-10-CM | POA: Diagnosis not present

## 2020-07-14 DIAGNOSIS — I361 Nonrheumatic tricuspid (valve) insufficiency: Secondary | ICD-10-CM | POA: Diagnosis not present

## 2020-07-14 DIAGNOSIS — I2510 Atherosclerotic heart disease of native coronary artery without angina pectoris: Secondary | ICD-10-CM | POA: Diagnosis not present

## 2020-07-14 DIAGNOSIS — I10 Essential (primary) hypertension: Secondary | ICD-10-CM | POA: Diagnosis not present

## 2020-07-14 DIAGNOSIS — J449 Chronic obstructive pulmonary disease, unspecified: Secondary | ICD-10-CM | POA: Diagnosis not present

## 2020-07-14 DIAGNOSIS — C61 Malignant neoplasm of prostate: Secondary | ICD-10-CM | POA: Diagnosis not present

## 2020-07-14 DIAGNOSIS — J432 Centrilobular emphysema: Secondary | ICD-10-CM | POA: Diagnosis not present

## 2020-07-17 DIAGNOSIS — I2510 Atherosclerotic heart disease of native coronary artery without angina pectoris: Secondary | ICD-10-CM | POA: Diagnosis not present

## 2020-07-17 DIAGNOSIS — I10 Essential (primary) hypertension: Secondary | ICD-10-CM | POA: Diagnosis not present

## 2020-07-17 DIAGNOSIS — I482 Chronic atrial fibrillation, unspecified: Secondary | ICD-10-CM | POA: Diagnosis not present

## 2020-07-17 DIAGNOSIS — I700 Atherosclerosis of aorta: Secondary | ICD-10-CM | POA: Diagnosis not present

## 2020-07-17 DIAGNOSIS — J432 Centrilobular emphysema: Secondary | ICD-10-CM | POA: Diagnosis not present

## 2020-07-17 DIAGNOSIS — I6523 Occlusion and stenosis of bilateral carotid arteries: Secondary | ICD-10-CM | POA: Diagnosis not present

## 2020-07-17 DIAGNOSIS — J9611 Chronic respiratory failure with hypoxia: Secondary | ICD-10-CM | POA: Diagnosis not present

## 2020-07-17 DIAGNOSIS — C61 Malignant neoplasm of prostate: Secondary | ICD-10-CM | POA: Diagnosis not present

## 2020-07-17 DIAGNOSIS — I361 Nonrheumatic tricuspid (valve) insufficiency: Secondary | ICD-10-CM | POA: Diagnosis not present

## 2020-07-18 DIAGNOSIS — I361 Nonrheumatic tricuspid (valve) insufficiency: Secondary | ICD-10-CM | POA: Diagnosis not present

## 2020-07-18 DIAGNOSIS — J432 Centrilobular emphysema: Secondary | ICD-10-CM | POA: Diagnosis not present

## 2020-07-18 DIAGNOSIS — J9611 Chronic respiratory failure with hypoxia: Secondary | ICD-10-CM | POA: Diagnosis not present

## 2020-07-18 DIAGNOSIS — C61 Malignant neoplasm of prostate: Secondary | ICD-10-CM | POA: Diagnosis not present

## 2020-07-18 DIAGNOSIS — I2510 Atherosclerotic heart disease of native coronary artery without angina pectoris: Secondary | ICD-10-CM | POA: Diagnosis not present

## 2020-07-18 DIAGNOSIS — I482 Chronic atrial fibrillation, unspecified: Secondary | ICD-10-CM | POA: Diagnosis not present

## 2020-07-18 DIAGNOSIS — I6523 Occlusion and stenosis of bilateral carotid arteries: Secondary | ICD-10-CM | POA: Diagnosis not present

## 2020-07-18 DIAGNOSIS — I700 Atherosclerosis of aorta: Secondary | ICD-10-CM | POA: Diagnosis not present

## 2020-07-18 DIAGNOSIS — I10 Essential (primary) hypertension: Secondary | ICD-10-CM | POA: Diagnosis not present

## 2020-07-21 DIAGNOSIS — C61 Malignant neoplasm of prostate: Secondary | ICD-10-CM | POA: Diagnosis not present

## 2020-07-21 DIAGNOSIS — I361 Nonrheumatic tricuspid (valve) insufficiency: Secondary | ICD-10-CM | POA: Diagnosis not present

## 2020-07-21 DIAGNOSIS — I700 Atherosclerosis of aorta: Secondary | ICD-10-CM | POA: Diagnosis not present

## 2020-07-21 DIAGNOSIS — J9611 Chronic respiratory failure with hypoxia: Secondary | ICD-10-CM | POA: Diagnosis not present

## 2020-07-21 DIAGNOSIS — I10 Essential (primary) hypertension: Secondary | ICD-10-CM | POA: Diagnosis not present

## 2020-07-21 DIAGNOSIS — I2510 Atherosclerotic heart disease of native coronary artery without angina pectoris: Secondary | ICD-10-CM | POA: Diagnosis not present

## 2020-07-21 DIAGNOSIS — J432 Centrilobular emphysema: Secondary | ICD-10-CM | POA: Diagnosis not present

## 2020-07-21 DIAGNOSIS — I482 Chronic atrial fibrillation, unspecified: Secondary | ICD-10-CM | POA: Diagnosis not present

## 2020-07-21 DIAGNOSIS — I6523 Occlusion and stenosis of bilateral carotid arteries: Secondary | ICD-10-CM | POA: Diagnosis not present

## 2020-07-22 DIAGNOSIS — J9611 Chronic respiratory failure with hypoxia: Secondary | ICD-10-CM | POA: Diagnosis not present

## 2020-07-22 DIAGNOSIS — C61 Malignant neoplasm of prostate: Secondary | ICD-10-CM | POA: Diagnosis not present

## 2020-07-22 DIAGNOSIS — I10 Essential (primary) hypertension: Secondary | ICD-10-CM | POA: Diagnosis not present

## 2020-07-22 DIAGNOSIS — I700 Atherosclerosis of aorta: Secondary | ICD-10-CM | POA: Diagnosis not present

## 2020-07-22 DIAGNOSIS — I2510 Atherosclerotic heart disease of native coronary artery without angina pectoris: Secondary | ICD-10-CM | POA: Diagnosis not present

## 2020-07-22 DIAGNOSIS — I361 Nonrheumatic tricuspid (valve) insufficiency: Secondary | ICD-10-CM | POA: Diagnosis not present

## 2020-07-22 DIAGNOSIS — I6523 Occlusion and stenosis of bilateral carotid arteries: Secondary | ICD-10-CM | POA: Diagnosis not present

## 2020-07-22 DIAGNOSIS — I482 Chronic atrial fibrillation, unspecified: Secondary | ICD-10-CM | POA: Diagnosis not present

## 2020-07-22 DIAGNOSIS — J432 Centrilobular emphysema: Secondary | ICD-10-CM | POA: Diagnosis not present

## 2020-07-24 DIAGNOSIS — J432 Centrilobular emphysema: Secondary | ICD-10-CM | POA: Diagnosis not present

## 2020-07-24 DIAGNOSIS — C61 Malignant neoplasm of prostate: Secondary | ICD-10-CM | POA: Diagnosis not present

## 2020-07-24 DIAGNOSIS — I700 Atherosclerosis of aorta: Secondary | ICD-10-CM | POA: Diagnosis not present

## 2020-07-24 DIAGNOSIS — I2510 Atherosclerotic heart disease of native coronary artery without angina pectoris: Secondary | ICD-10-CM | POA: Diagnosis not present

## 2020-07-24 DIAGNOSIS — I361 Nonrheumatic tricuspid (valve) insufficiency: Secondary | ICD-10-CM | POA: Diagnosis not present

## 2020-07-24 DIAGNOSIS — I10 Essential (primary) hypertension: Secondary | ICD-10-CM | POA: Diagnosis not present

## 2020-07-24 DIAGNOSIS — I482 Chronic atrial fibrillation, unspecified: Secondary | ICD-10-CM | POA: Diagnosis not present

## 2020-07-24 DIAGNOSIS — I6523 Occlusion and stenosis of bilateral carotid arteries: Secondary | ICD-10-CM | POA: Diagnosis not present

## 2020-07-24 DIAGNOSIS — J9611 Chronic respiratory failure with hypoxia: Secondary | ICD-10-CM | POA: Diagnosis not present

## 2020-07-25 DIAGNOSIS — I2510 Atherosclerotic heart disease of native coronary artery without angina pectoris: Secondary | ICD-10-CM | POA: Diagnosis not present

## 2020-07-25 DIAGNOSIS — I10 Essential (primary) hypertension: Secondary | ICD-10-CM | POA: Diagnosis not present

## 2020-07-25 DIAGNOSIS — I482 Chronic atrial fibrillation, unspecified: Secondary | ICD-10-CM | POA: Diagnosis not present

## 2020-07-25 DIAGNOSIS — C61 Malignant neoplasm of prostate: Secondary | ICD-10-CM | POA: Diagnosis not present

## 2020-07-25 DIAGNOSIS — J432 Centrilobular emphysema: Secondary | ICD-10-CM | POA: Diagnosis not present

## 2020-07-25 DIAGNOSIS — I361 Nonrheumatic tricuspid (valve) insufficiency: Secondary | ICD-10-CM | POA: Diagnosis not present

## 2020-07-25 DIAGNOSIS — J9611 Chronic respiratory failure with hypoxia: Secondary | ICD-10-CM | POA: Diagnosis not present

## 2020-07-25 DIAGNOSIS — I700 Atherosclerosis of aorta: Secondary | ICD-10-CM | POA: Diagnosis not present

## 2020-07-25 DIAGNOSIS — I6523 Occlusion and stenosis of bilateral carotid arteries: Secondary | ICD-10-CM | POA: Diagnosis not present

## 2020-07-27 DIAGNOSIS — I700 Atherosclerosis of aorta: Secondary | ICD-10-CM | POA: Diagnosis not present

## 2020-07-27 DIAGNOSIS — C61 Malignant neoplasm of prostate: Secondary | ICD-10-CM | POA: Diagnosis not present

## 2020-07-27 DIAGNOSIS — J9611 Chronic respiratory failure with hypoxia: Secondary | ICD-10-CM | POA: Diagnosis not present

## 2020-07-27 DIAGNOSIS — I2510 Atherosclerotic heart disease of native coronary artery without angina pectoris: Secondary | ICD-10-CM | POA: Diagnosis not present

## 2020-07-27 DIAGNOSIS — I10 Essential (primary) hypertension: Secondary | ICD-10-CM | POA: Diagnosis not present

## 2020-07-27 DIAGNOSIS — I482 Chronic atrial fibrillation, unspecified: Secondary | ICD-10-CM | POA: Diagnosis not present

## 2020-07-27 DIAGNOSIS — I6523 Occlusion and stenosis of bilateral carotid arteries: Secondary | ICD-10-CM | POA: Diagnosis not present

## 2020-07-27 DIAGNOSIS — I361 Nonrheumatic tricuspid (valve) insufficiency: Secondary | ICD-10-CM | POA: Diagnosis not present

## 2020-07-27 DIAGNOSIS — J432 Centrilobular emphysema: Secondary | ICD-10-CM | POA: Diagnosis not present

## 2020-07-28 DIAGNOSIS — I10 Essential (primary) hypertension: Secondary | ICD-10-CM | POA: Diagnosis not present

## 2020-07-28 DIAGNOSIS — I2510 Atherosclerotic heart disease of native coronary artery without angina pectoris: Secondary | ICD-10-CM | POA: Diagnosis not present

## 2020-07-28 DIAGNOSIS — I482 Chronic atrial fibrillation, unspecified: Secondary | ICD-10-CM | POA: Diagnosis not present

## 2020-07-28 DIAGNOSIS — J9611 Chronic respiratory failure with hypoxia: Secondary | ICD-10-CM | POA: Diagnosis not present

## 2020-07-28 DIAGNOSIS — I361 Nonrheumatic tricuspid (valve) insufficiency: Secondary | ICD-10-CM | POA: Diagnosis not present

## 2020-07-28 DIAGNOSIS — J432 Centrilobular emphysema: Secondary | ICD-10-CM | POA: Diagnosis not present

## 2020-07-28 DIAGNOSIS — I700 Atherosclerosis of aorta: Secondary | ICD-10-CM | POA: Diagnosis not present

## 2020-07-28 DIAGNOSIS — I6523 Occlusion and stenosis of bilateral carotid arteries: Secondary | ICD-10-CM | POA: Diagnosis not present

## 2020-07-28 DIAGNOSIS — C61 Malignant neoplasm of prostate: Secondary | ICD-10-CM | POA: Diagnosis not present

## 2020-07-30 DIAGNOSIS — I482 Chronic atrial fibrillation, unspecified: Secondary | ICD-10-CM | POA: Diagnosis not present

## 2020-07-30 DIAGNOSIS — I6523 Occlusion and stenosis of bilateral carotid arteries: Secondary | ICD-10-CM | POA: Diagnosis not present

## 2020-07-30 DIAGNOSIS — I2510 Atherosclerotic heart disease of native coronary artery without angina pectoris: Secondary | ICD-10-CM | POA: Diagnosis not present

## 2020-07-30 DIAGNOSIS — I10 Essential (primary) hypertension: Secondary | ICD-10-CM | POA: Diagnosis not present

## 2020-07-30 DIAGNOSIS — J432 Centrilobular emphysema: Secondary | ICD-10-CM | POA: Diagnosis not present

## 2020-07-30 DIAGNOSIS — C61 Malignant neoplasm of prostate: Secondary | ICD-10-CM | POA: Diagnosis not present

## 2020-07-30 DIAGNOSIS — I700 Atherosclerosis of aorta: Secondary | ICD-10-CM | POA: Diagnosis not present

## 2020-07-30 DIAGNOSIS — J9611 Chronic respiratory failure with hypoxia: Secondary | ICD-10-CM | POA: Diagnosis not present

## 2020-07-30 DIAGNOSIS — I361 Nonrheumatic tricuspid (valve) insufficiency: Secondary | ICD-10-CM | POA: Diagnosis not present

## 2020-07-31 DIAGNOSIS — J432 Centrilobular emphysema: Secondary | ICD-10-CM | POA: Diagnosis not present

## 2020-08-05 DIAGNOSIS — J9611 Chronic respiratory failure with hypoxia: Secondary | ICD-10-CM | POA: Diagnosis not present

## 2020-08-07 DIAGNOSIS — I700 Atherosclerosis of aorta: Secondary | ICD-10-CM | POA: Diagnosis not present

## 2020-08-07 DIAGNOSIS — I361 Nonrheumatic tricuspid (valve) insufficiency: Secondary | ICD-10-CM | POA: Diagnosis not present

## 2020-08-07 DIAGNOSIS — I2510 Atherosclerotic heart disease of native coronary artery without angina pectoris: Secondary | ICD-10-CM | POA: Diagnosis not present

## 2020-08-07 DIAGNOSIS — J449 Chronic obstructive pulmonary disease, unspecified: Secondary | ICD-10-CM | POA: Diagnosis not present

## 2020-08-07 DIAGNOSIS — I6523 Occlusion and stenosis of bilateral carotid arteries: Secondary | ICD-10-CM | POA: Diagnosis not present

## 2020-08-07 DIAGNOSIS — I10 Essential (primary) hypertension: Secondary | ICD-10-CM | POA: Diagnosis not present

## 2020-08-07 DIAGNOSIS — I482 Chronic atrial fibrillation, unspecified: Secondary | ICD-10-CM | POA: Diagnosis not present

## 2020-08-07 DIAGNOSIS — C61 Malignant neoplasm of prostate: Secondary | ICD-10-CM | POA: Diagnosis not present

## 2020-08-07 DIAGNOSIS — J9611 Chronic respiratory failure with hypoxia: Secondary | ICD-10-CM | POA: Diagnosis not present

## 2020-08-07 DIAGNOSIS — J432 Centrilobular emphysema: Secondary | ICD-10-CM | POA: Diagnosis not present

## 2020-08-09 DIAGNOSIS — I482 Chronic atrial fibrillation, unspecified: Secondary | ICD-10-CM | POA: Diagnosis not present

## 2020-08-09 DIAGNOSIS — J432 Centrilobular emphysema: Secondary | ICD-10-CM | POA: Diagnosis not present

## 2020-08-09 DIAGNOSIS — I361 Nonrheumatic tricuspid (valve) insufficiency: Secondary | ICD-10-CM | POA: Diagnosis not present

## 2020-08-09 DIAGNOSIS — I10 Essential (primary) hypertension: Secondary | ICD-10-CM | POA: Diagnosis not present

## 2020-08-09 DIAGNOSIS — I2510 Atherosclerotic heart disease of native coronary artery without angina pectoris: Secondary | ICD-10-CM | POA: Diagnosis not present

## 2020-08-09 DIAGNOSIS — I6523 Occlusion and stenosis of bilateral carotid arteries: Secondary | ICD-10-CM | POA: Diagnosis not present

## 2020-08-09 DIAGNOSIS — I700 Atherosclerosis of aorta: Secondary | ICD-10-CM | POA: Diagnosis not present

## 2020-08-09 DIAGNOSIS — C61 Malignant neoplasm of prostate: Secondary | ICD-10-CM | POA: Diagnosis not present

## 2020-08-09 DIAGNOSIS — J9611 Chronic respiratory failure with hypoxia: Secondary | ICD-10-CM | POA: Diagnosis not present

## 2020-08-14 DIAGNOSIS — I700 Atherosclerosis of aorta: Secondary | ICD-10-CM | POA: Diagnosis not present

## 2020-08-14 DIAGNOSIS — C61 Malignant neoplasm of prostate: Secondary | ICD-10-CM | POA: Diagnosis not present

## 2020-08-14 DIAGNOSIS — I2510 Atherosclerotic heart disease of native coronary artery without angina pectoris: Secondary | ICD-10-CM | POA: Diagnosis not present

## 2020-08-14 DIAGNOSIS — J432 Centrilobular emphysema: Secondary | ICD-10-CM | POA: Diagnosis not present

## 2020-08-14 DIAGNOSIS — I6523 Occlusion and stenosis of bilateral carotid arteries: Secondary | ICD-10-CM | POA: Diagnosis not present

## 2020-08-14 DIAGNOSIS — I361 Nonrheumatic tricuspid (valve) insufficiency: Secondary | ICD-10-CM | POA: Diagnosis not present

## 2020-08-14 DIAGNOSIS — I482 Chronic atrial fibrillation, unspecified: Secondary | ICD-10-CM | POA: Diagnosis not present

## 2020-08-14 DIAGNOSIS — J9611 Chronic respiratory failure with hypoxia: Secondary | ICD-10-CM | POA: Diagnosis not present

## 2020-08-14 DIAGNOSIS — I10 Essential (primary) hypertension: Secondary | ICD-10-CM | POA: Diagnosis not present

## 2020-08-17 DIAGNOSIS — I361 Nonrheumatic tricuspid (valve) insufficiency: Secondary | ICD-10-CM | POA: Diagnosis not present

## 2020-08-17 DIAGNOSIS — I482 Chronic atrial fibrillation, unspecified: Secondary | ICD-10-CM | POA: Diagnosis not present

## 2020-08-17 DIAGNOSIS — I700 Atherosclerosis of aorta: Secondary | ICD-10-CM | POA: Diagnosis not present

## 2020-08-17 DIAGNOSIS — C61 Malignant neoplasm of prostate: Secondary | ICD-10-CM | POA: Diagnosis not present

## 2020-08-17 DIAGNOSIS — I6523 Occlusion and stenosis of bilateral carotid arteries: Secondary | ICD-10-CM | POA: Diagnosis not present

## 2020-08-17 DIAGNOSIS — J432 Centrilobular emphysema: Secondary | ICD-10-CM | POA: Diagnosis not present

## 2020-08-17 DIAGNOSIS — I10 Essential (primary) hypertension: Secondary | ICD-10-CM | POA: Diagnosis not present

## 2020-08-17 DIAGNOSIS — J9611 Chronic respiratory failure with hypoxia: Secondary | ICD-10-CM | POA: Diagnosis not present

## 2020-08-17 DIAGNOSIS — I2510 Atherosclerotic heart disease of native coronary artery without angina pectoris: Secondary | ICD-10-CM | POA: Diagnosis not present

## 2020-08-24 DIAGNOSIS — I6523 Occlusion and stenosis of bilateral carotid arteries: Secondary | ICD-10-CM | POA: Diagnosis not present

## 2020-08-24 DIAGNOSIS — I10 Essential (primary) hypertension: Secondary | ICD-10-CM | POA: Diagnosis not present

## 2020-08-24 DIAGNOSIS — I700 Atherosclerosis of aorta: Secondary | ICD-10-CM | POA: Diagnosis not present

## 2020-08-24 DIAGNOSIS — I361 Nonrheumatic tricuspid (valve) insufficiency: Secondary | ICD-10-CM | POA: Diagnosis not present

## 2020-08-24 DIAGNOSIS — J432 Centrilobular emphysema: Secondary | ICD-10-CM | POA: Diagnosis not present

## 2020-08-24 DIAGNOSIS — C61 Malignant neoplasm of prostate: Secondary | ICD-10-CM | POA: Diagnosis not present

## 2020-08-24 DIAGNOSIS — I482 Chronic atrial fibrillation, unspecified: Secondary | ICD-10-CM | POA: Diagnosis not present

## 2020-08-24 DIAGNOSIS — I2510 Atherosclerotic heart disease of native coronary artery without angina pectoris: Secondary | ICD-10-CM | POA: Diagnosis not present

## 2020-08-24 DIAGNOSIS — J9611 Chronic respiratory failure with hypoxia: Secondary | ICD-10-CM | POA: Diagnosis not present

## 2020-08-29 DIAGNOSIS — I6523 Occlusion and stenosis of bilateral carotid arteries: Secondary | ICD-10-CM | POA: Diagnosis not present

## 2020-08-29 DIAGNOSIS — C61 Malignant neoplasm of prostate: Secondary | ICD-10-CM | POA: Diagnosis not present

## 2020-08-29 DIAGNOSIS — J432 Centrilobular emphysema: Secondary | ICD-10-CM | POA: Diagnosis not present

## 2020-08-29 DIAGNOSIS — I482 Chronic atrial fibrillation, unspecified: Secondary | ICD-10-CM | POA: Diagnosis not present

## 2020-08-29 DIAGNOSIS — I700 Atherosclerosis of aorta: Secondary | ICD-10-CM | POA: Diagnosis not present

## 2020-08-29 DIAGNOSIS — I361 Nonrheumatic tricuspid (valve) insufficiency: Secondary | ICD-10-CM | POA: Diagnosis not present

## 2020-08-29 DIAGNOSIS — J9611 Chronic respiratory failure with hypoxia: Secondary | ICD-10-CM | POA: Diagnosis not present

## 2020-08-29 DIAGNOSIS — I2510 Atherosclerotic heart disease of native coronary artery without angina pectoris: Secondary | ICD-10-CM | POA: Diagnosis not present

## 2020-08-29 DIAGNOSIS — I10 Essential (primary) hypertension: Secondary | ICD-10-CM | POA: Diagnosis not present

## 2020-08-30 DIAGNOSIS — J432 Centrilobular emphysema: Secondary | ICD-10-CM | POA: Diagnosis not present

## 2020-09-04 DIAGNOSIS — J9611 Chronic respiratory failure with hypoxia: Secondary | ICD-10-CM | POA: Diagnosis not present

## 2020-09-06 DIAGNOSIS — J449 Chronic obstructive pulmonary disease, unspecified: Secondary | ICD-10-CM | POA: Diagnosis not present

## 2020-09-06 IMAGING — CT CT CHEST W/O CM
2 of 4 series · 15 of 36 positions shown, 18 images · non-contrast
Comparison: None.

CLINICAL DATA: History of prostate cancer. Former smoker. Oxygen
dependent. Shortness of breath.

EXAM:
CT CHEST WITHOUT CONTRAST
TECHNIQUE: Multidetector CT imaging of the chest was performed following the
standard protocol without IV contrast.

[Series 2: chest · axial · 0.75mm/px · z∈[-1218,-916]mm · 12 of 179 slices shown, 15 images (1 of 2)]
[im 14/179  mediastinal]
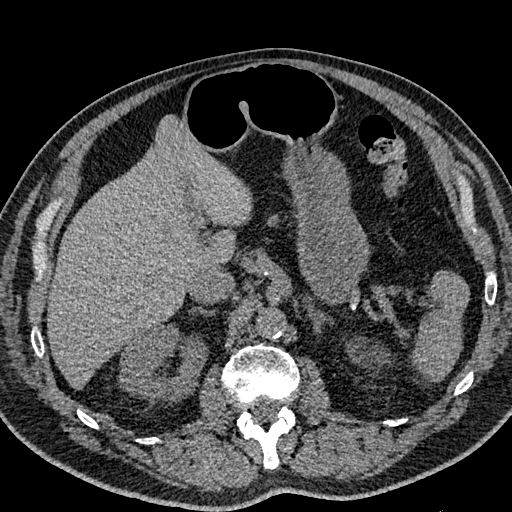
[im 14/179  lung]
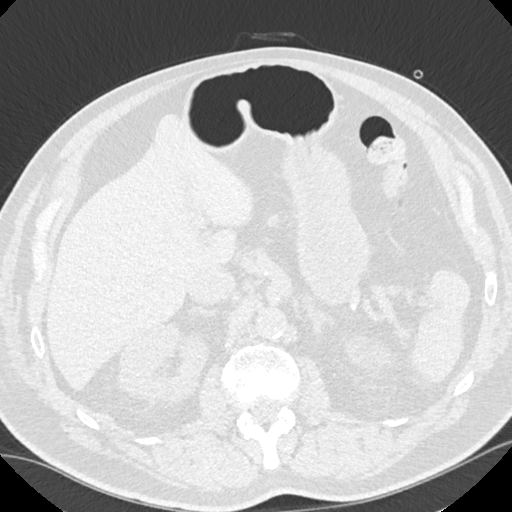
[im 28/179  lung]
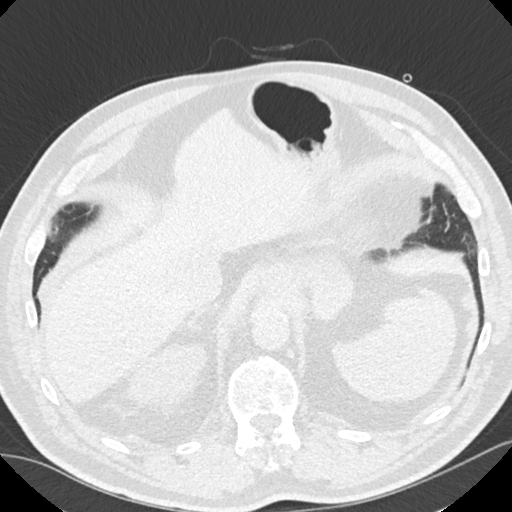
[im 42/179  lung]
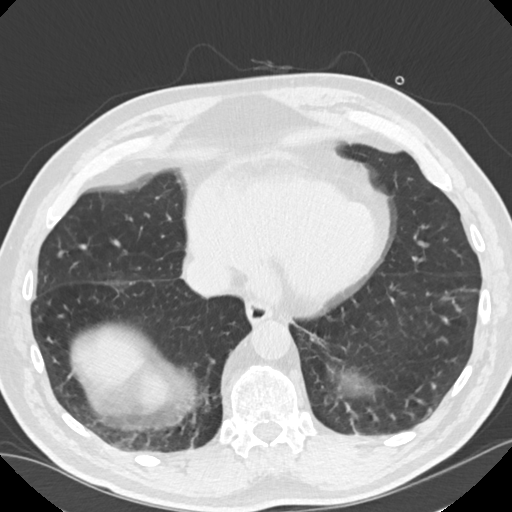
[im 55/179  lung]
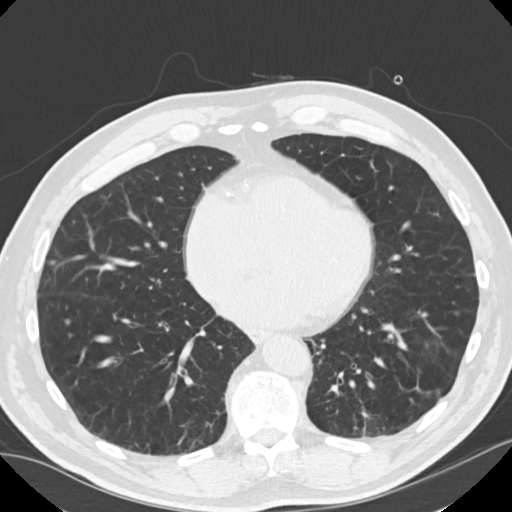
[im 69/179  mediastinal]
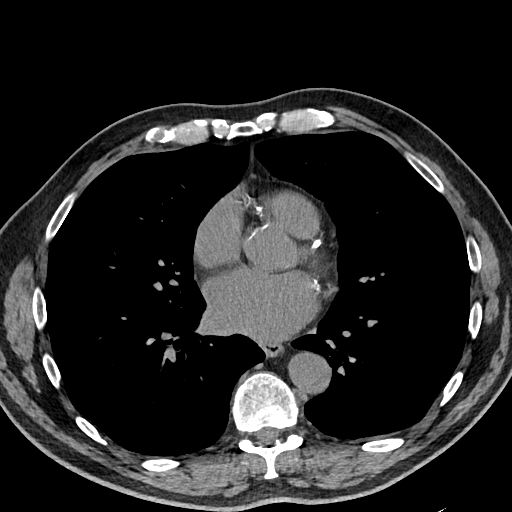
[im 69/179  lung]
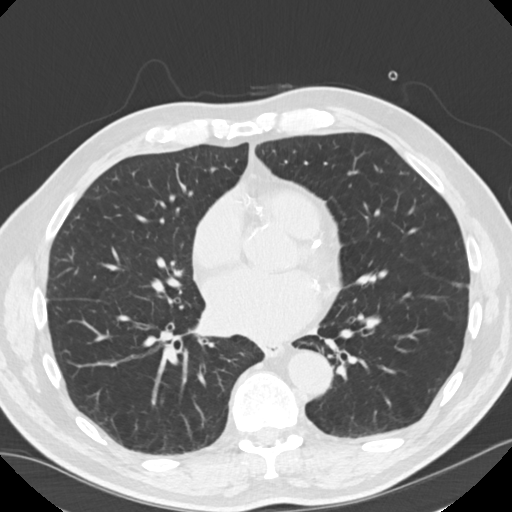
[im 83/179  lung]
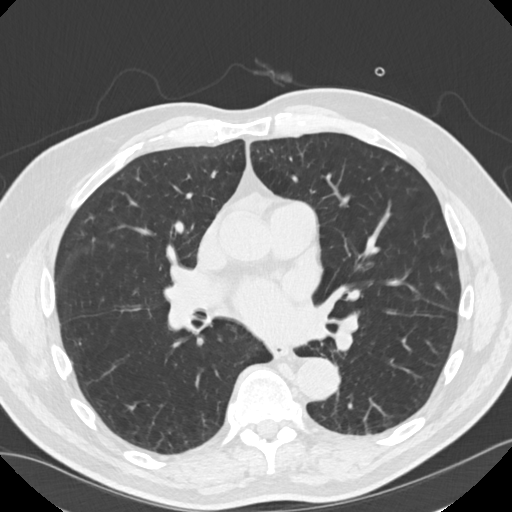
[im 96/179  lung]
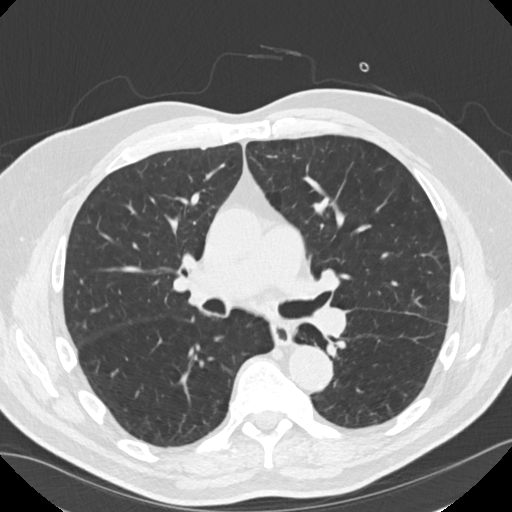
[im 110/179  lung]
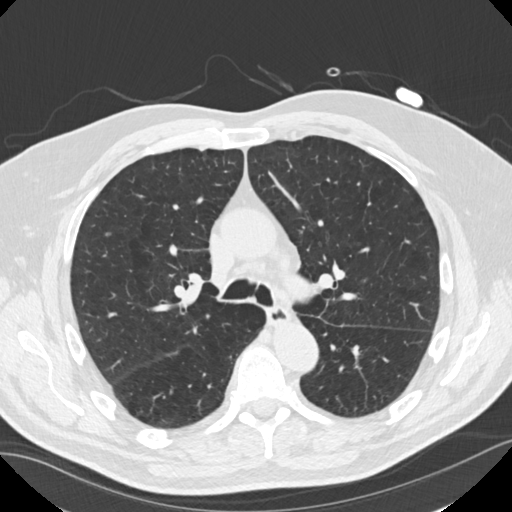
[im 124/179  mediastinal]
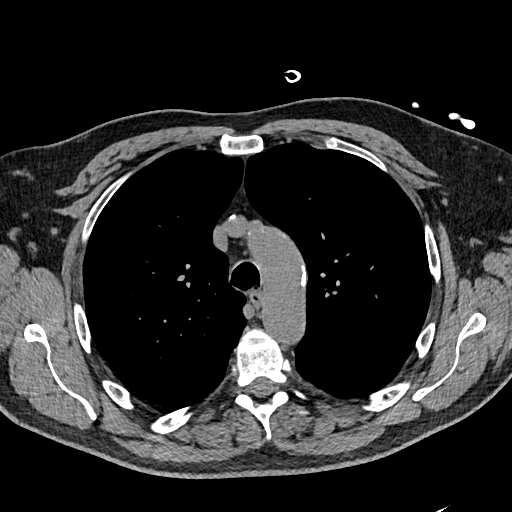
[im 124/179  lung]
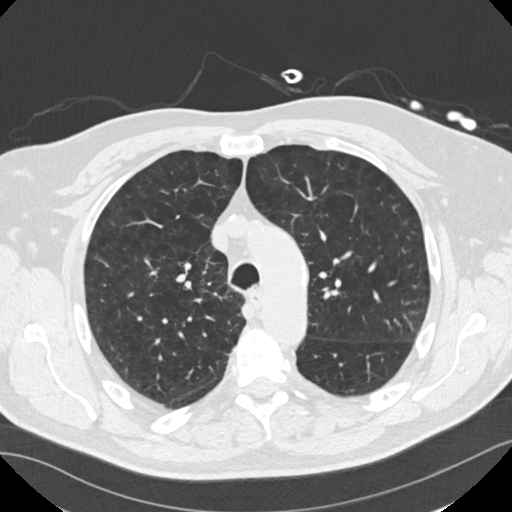
[im 137/179  lung]
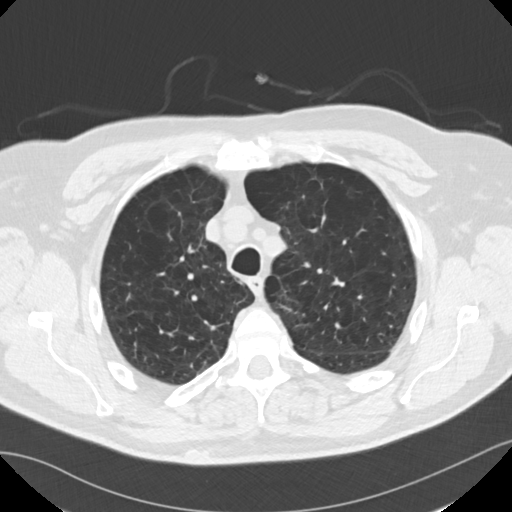
[im 151/179  lung]
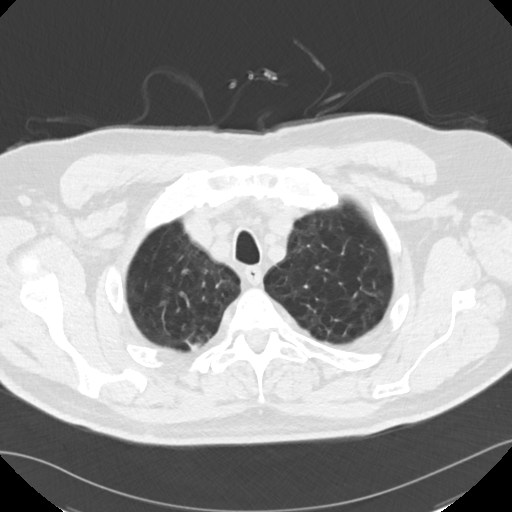
[im 165/179  lung]
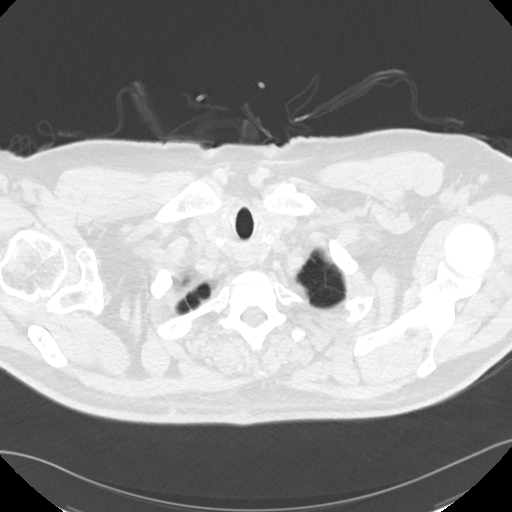

[Series 5: chest · coronal · 0.70mm/px · 3 of 173 slices shown (2 of 2)]
[im 35/173  lung]
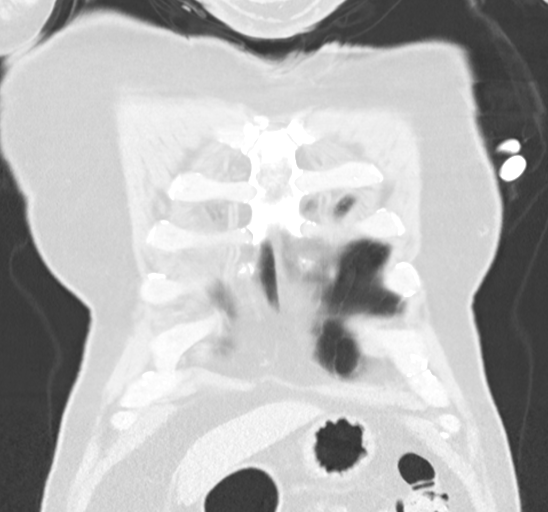
[im 69/173  lung]
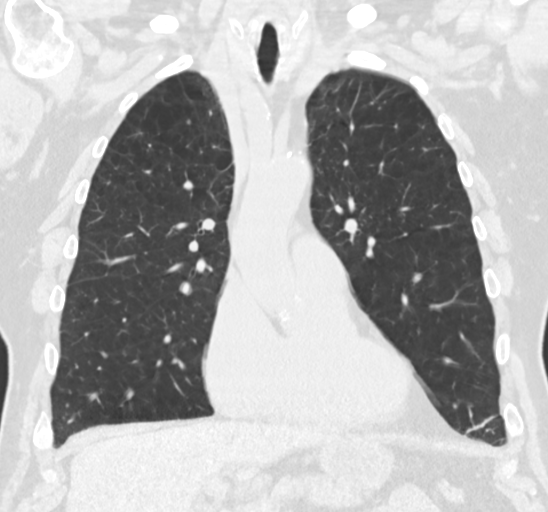
[im 104/173  lung]
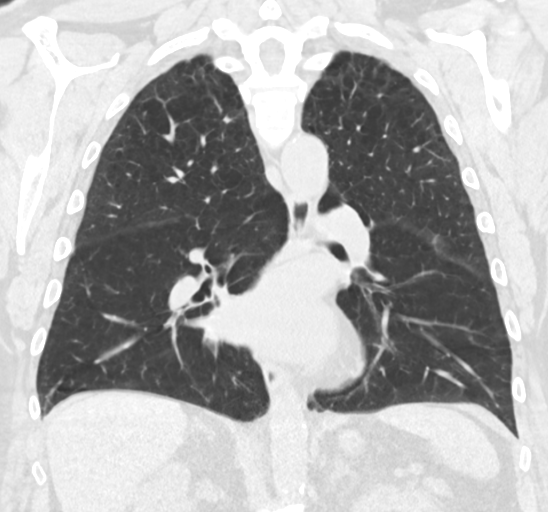

[15 of 36 positions shown; findings below may reference images not displayed]

FINDINGS: Cardiovascular: Heart is normal in size. Calcified plaque over the
left main and 3 vessel coronary arteries. Minimal calcified plaque
over the thoracic aorta. Remaining vascular structures are
unremarkable.

Mediastinum/Nodes: No significant mediastinal or hilar adenopathy.
Remaining mediastinal structures are unremarkable.

Lungs/Pleura: Lungs are adequately inflated without focal lobar
consolidation or effusion. Moderate bilateral centrilobular
emphysema. 3 mm nodule over the anterior left upper lobe. 2 mm
peripheral nodule over the lateral aspect of the right middle lobe
and 5 mm sub solid nodule over the periphery of the posterolateral
right lower lobe. 5 mm (6 x 4 mm) nodule over the posteromedial
right lower lobe. Minimal aspirate material along the left posterior
dependent aspect of the trachea. Visualized bronchi are normal.

Upper Abdomen: Calcified plaque over the abdominal aorta. No acute
findings.

Musculoskeletal: Minimal degenerative change of the spine.
IMPRESSION: No acute cardiopulmonary disease.

Several small subcentimeter bilateral pulmonary nodules with the
largest measuring 5 mm over the posteromedial right lower lobe.
Recommend follow-up noncontrast chest CT 1 year. This recommendation
follows the consensus statement: Guidelines for Management of Small
Pulmonary Nodules Detected on CT Scans: A Statement from the
Online at: [URL]

Small amount of aspirate material over the distal trachea.

Aortic Atherosclerosis (7Q4Q5-G4C.C) and Emphysema (7Q4Q5-J53.4).

Atherosclerotic coronary artery disease.

## 2020-09-07 DIAGNOSIS — J432 Centrilobular emphysema: Secondary | ICD-10-CM | POA: Diagnosis not present

## 2020-09-07 DIAGNOSIS — I700 Atherosclerosis of aorta: Secondary | ICD-10-CM | POA: Diagnosis not present

## 2020-09-07 DIAGNOSIS — I6523 Occlusion and stenosis of bilateral carotid arteries: Secondary | ICD-10-CM | POA: Diagnosis not present

## 2020-09-07 DIAGNOSIS — I361 Nonrheumatic tricuspid (valve) insufficiency: Secondary | ICD-10-CM | POA: Diagnosis not present

## 2020-09-07 DIAGNOSIS — I10 Essential (primary) hypertension: Secondary | ICD-10-CM | POA: Diagnosis not present

## 2020-09-07 DIAGNOSIS — C61 Malignant neoplasm of prostate: Secondary | ICD-10-CM | POA: Diagnosis not present

## 2020-09-07 DIAGNOSIS — I482 Chronic atrial fibrillation, unspecified: Secondary | ICD-10-CM | POA: Diagnosis not present

## 2020-09-07 DIAGNOSIS — J9611 Chronic respiratory failure with hypoxia: Secondary | ICD-10-CM | POA: Diagnosis not present

## 2020-09-07 DIAGNOSIS — I2510 Atherosclerotic heart disease of native coronary artery without angina pectoris: Secondary | ICD-10-CM | POA: Diagnosis not present

## 2020-09-13 DIAGNOSIS — J9611 Chronic respiratory failure with hypoxia: Secondary | ICD-10-CM | POA: Diagnosis not present

## 2020-09-13 DIAGNOSIS — J432 Centrilobular emphysema: Secondary | ICD-10-CM | POA: Diagnosis not present

## 2020-09-13 DIAGNOSIS — C61 Malignant neoplasm of prostate: Secondary | ICD-10-CM | POA: Diagnosis not present

## 2020-09-13 DIAGNOSIS — I2510 Atherosclerotic heart disease of native coronary artery without angina pectoris: Secondary | ICD-10-CM | POA: Diagnosis not present

## 2020-09-13 DIAGNOSIS — I361 Nonrheumatic tricuspid (valve) insufficiency: Secondary | ICD-10-CM | POA: Diagnosis not present

## 2020-09-13 DIAGNOSIS — I482 Chronic atrial fibrillation, unspecified: Secondary | ICD-10-CM | POA: Diagnosis not present

## 2020-09-13 DIAGNOSIS — I6523 Occlusion and stenosis of bilateral carotid arteries: Secondary | ICD-10-CM | POA: Diagnosis not present

## 2020-09-13 DIAGNOSIS — I700 Atherosclerosis of aorta: Secondary | ICD-10-CM | POA: Diagnosis not present

## 2020-09-13 DIAGNOSIS — I10 Essential (primary) hypertension: Secondary | ICD-10-CM | POA: Diagnosis not present

## 2020-09-25 DIAGNOSIS — C61 Malignant neoplasm of prostate: Secondary | ICD-10-CM | POA: Diagnosis not present

## 2020-09-25 DIAGNOSIS — I10 Essential (primary) hypertension: Secondary | ICD-10-CM | POA: Diagnosis not present

## 2020-09-25 DIAGNOSIS — I700 Atherosclerosis of aorta: Secondary | ICD-10-CM | POA: Diagnosis not present

## 2020-09-25 DIAGNOSIS — I482 Chronic atrial fibrillation, unspecified: Secondary | ICD-10-CM | POA: Diagnosis not present

## 2020-09-25 DIAGNOSIS — J9611 Chronic respiratory failure with hypoxia: Secondary | ICD-10-CM | POA: Diagnosis not present

## 2020-09-25 DIAGNOSIS — I6523 Occlusion and stenosis of bilateral carotid arteries: Secondary | ICD-10-CM | POA: Diagnosis not present

## 2020-09-25 DIAGNOSIS — I2510 Atherosclerotic heart disease of native coronary artery without angina pectoris: Secondary | ICD-10-CM | POA: Diagnosis not present

## 2020-09-25 DIAGNOSIS — J432 Centrilobular emphysema: Secondary | ICD-10-CM | POA: Diagnosis not present

## 2020-09-25 DIAGNOSIS — I361 Nonrheumatic tricuspid (valve) insufficiency: Secondary | ICD-10-CM | POA: Diagnosis not present

## 2020-09-30 DIAGNOSIS — J432 Centrilobular emphysema: Secondary | ICD-10-CM | POA: Diagnosis not present

## 2020-10-05 DIAGNOSIS — J9611 Chronic respiratory failure with hypoxia: Secondary | ICD-10-CM | POA: Diagnosis not present

## 2020-10-07 DIAGNOSIS — J449 Chronic obstructive pulmonary disease, unspecified: Secondary | ICD-10-CM | POA: Diagnosis not present

## 2020-10-19 DIAGNOSIS — J432 Centrilobular emphysema: Secondary | ICD-10-CM | POA: Diagnosis not present

## 2020-10-19 DIAGNOSIS — I6523 Occlusion and stenosis of bilateral carotid arteries: Secondary | ICD-10-CM | POA: Diagnosis not present

## 2020-10-19 DIAGNOSIS — I482 Chronic atrial fibrillation, unspecified: Secondary | ICD-10-CM | POA: Diagnosis not present

## 2020-10-19 DIAGNOSIS — I361 Nonrheumatic tricuspid (valve) insufficiency: Secondary | ICD-10-CM | POA: Diagnosis not present

## 2020-10-19 DIAGNOSIS — I2510 Atherosclerotic heart disease of native coronary artery without angina pectoris: Secondary | ICD-10-CM | POA: Diagnosis not present

## 2020-10-19 DIAGNOSIS — I10 Essential (primary) hypertension: Secondary | ICD-10-CM | POA: Diagnosis not present

## 2020-10-19 DIAGNOSIS — C61 Malignant neoplasm of prostate: Secondary | ICD-10-CM | POA: Diagnosis not present

## 2020-10-19 DIAGNOSIS — J9611 Chronic respiratory failure with hypoxia: Secondary | ICD-10-CM | POA: Diagnosis not present

## 2020-10-19 DIAGNOSIS — I700 Atherosclerosis of aorta: Secondary | ICD-10-CM | POA: Diagnosis not present

## 2020-10-20 DIAGNOSIS — J9611 Chronic respiratory failure with hypoxia: Secondary | ICD-10-CM | POA: Diagnosis not present

## 2020-10-20 DIAGNOSIS — I6523 Occlusion and stenosis of bilateral carotid arteries: Secondary | ICD-10-CM | POA: Diagnosis not present

## 2020-10-20 DIAGNOSIS — I482 Chronic atrial fibrillation, unspecified: Secondary | ICD-10-CM | POA: Diagnosis not present

## 2020-10-20 DIAGNOSIS — J432 Centrilobular emphysema: Secondary | ICD-10-CM | POA: Diagnosis not present

## 2020-10-20 DIAGNOSIS — I2510 Atherosclerotic heart disease of native coronary artery without angina pectoris: Secondary | ICD-10-CM | POA: Diagnosis not present

## 2020-10-20 DIAGNOSIS — C61 Malignant neoplasm of prostate: Secondary | ICD-10-CM | POA: Diagnosis not present

## 2020-10-20 DIAGNOSIS — I10 Essential (primary) hypertension: Secondary | ICD-10-CM | POA: Diagnosis not present

## 2020-10-20 DIAGNOSIS — I700 Atherosclerosis of aorta: Secondary | ICD-10-CM | POA: Diagnosis not present

## 2020-10-20 DIAGNOSIS — I361 Nonrheumatic tricuspid (valve) insufficiency: Secondary | ICD-10-CM | POA: Diagnosis not present

## 2020-10-20 DIAGNOSIS — J449 Chronic obstructive pulmonary disease, unspecified: Secondary | ICD-10-CM | POA: Diagnosis not present

## 2020-10-26 DIAGNOSIS — I10 Essential (primary) hypertension: Secondary | ICD-10-CM | POA: Diagnosis not present

## 2020-10-26 DIAGNOSIS — I2510 Atherosclerotic heart disease of native coronary artery without angina pectoris: Secondary | ICD-10-CM | POA: Diagnosis not present

## 2020-10-26 DIAGNOSIS — I6523 Occlusion and stenosis of bilateral carotid arteries: Secondary | ICD-10-CM | POA: Diagnosis not present

## 2020-10-26 DIAGNOSIS — J432 Centrilobular emphysema: Secondary | ICD-10-CM | POA: Diagnosis not present

## 2020-10-26 DIAGNOSIS — I361 Nonrheumatic tricuspid (valve) insufficiency: Secondary | ICD-10-CM | POA: Diagnosis not present

## 2020-10-26 DIAGNOSIS — I482 Chronic atrial fibrillation, unspecified: Secondary | ICD-10-CM | POA: Diagnosis not present

## 2020-10-26 DIAGNOSIS — I700 Atherosclerosis of aorta: Secondary | ICD-10-CM | POA: Diagnosis not present

## 2020-10-26 DIAGNOSIS — C61 Malignant neoplasm of prostate: Secondary | ICD-10-CM | POA: Diagnosis not present

## 2020-10-26 DIAGNOSIS — J9611 Chronic respiratory failure with hypoxia: Secondary | ICD-10-CM | POA: Diagnosis not present

## 2020-10-30 ENCOUNTER — Inpatient Hospital Stay
Admission: EM | Admit: 2020-10-30 | Discharge: 2020-11-02 | DRG: 189 | Disposition: A | Discharge status: Home Health | Payer: Medicare HMO | Attending: Internal Medicine | Admitting: Internal Medicine

## 2020-10-30 ENCOUNTER — Other Ambulatory Visit: Payer: Self-pay

## 2020-10-30 ENCOUNTER — Emergency Department: Payer: Medicare HMO

## 2020-10-30 DIAGNOSIS — I10 Essential (primary) hypertension: Secondary | ICD-10-CM | POA: Diagnosis not present

## 2020-10-30 DIAGNOSIS — Z79899 Other long term (current) drug therapy: Secondary | ICD-10-CM | POA: Diagnosis not present

## 2020-10-30 DIAGNOSIS — J9621 Acute and chronic respiratory failure with hypoxia: Secondary | ICD-10-CM | POA: Diagnosis not present

## 2020-10-30 DIAGNOSIS — Z955 Presence of coronary angioplasty implant and graft: Secondary | ICD-10-CM | POA: Diagnosis not present

## 2020-10-30 DIAGNOSIS — N400 Enlarged prostate without lower urinary tract symptoms: Secondary | ICD-10-CM | POA: Diagnosis present

## 2020-10-30 DIAGNOSIS — Z7902 Long term (current) use of antithrombotics/antiplatelets: Secondary | ICD-10-CM | POA: Diagnosis not present

## 2020-10-30 DIAGNOSIS — Z9981 Dependence on supplemental oxygen: Secondary | ICD-10-CM | POA: Diagnosis not present

## 2020-10-30 DIAGNOSIS — F17210 Nicotine dependence, cigarettes, uncomplicated: Secondary | ICD-10-CM | POA: Diagnosis present

## 2020-10-30 DIAGNOSIS — I482 Chronic atrial fibrillation, unspecified: Secondary | ICD-10-CM | POA: Diagnosis present

## 2020-10-30 DIAGNOSIS — G47419 Narcolepsy without cataplexy: Secondary | ICD-10-CM | POA: Diagnosis present

## 2020-10-30 DIAGNOSIS — R0902 Hypoxemia: Secondary | ICD-10-CM | POA: Diagnosis not present

## 2020-10-30 DIAGNOSIS — Z8546 Personal history of malignant neoplasm of prostate: Secondary | ICD-10-CM | POA: Diagnosis not present

## 2020-10-30 DIAGNOSIS — I2510 Atherosclerotic heart disease of native coronary artery without angina pectoris: Secondary | ICD-10-CM | POA: Diagnosis present

## 2020-10-30 DIAGNOSIS — J80 Acute respiratory distress syndrome: Secondary | ICD-10-CM | POA: Diagnosis not present

## 2020-10-30 DIAGNOSIS — Z7951 Long term (current) use of inhaled steroids: Secondary | ICD-10-CM

## 2020-10-30 DIAGNOSIS — R0600 Dyspnea, unspecified: Secondary | ICD-10-CM | POA: Diagnosis not present

## 2020-10-30 DIAGNOSIS — J439 Emphysema, unspecified: Secondary | ICD-10-CM | POA: Diagnosis present

## 2020-10-30 DIAGNOSIS — I4891 Unspecified atrial fibrillation: Secondary | ICD-10-CM | POA: Diagnosis not present

## 2020-10-30 DIAGNOSIS — E782 Mixed hyperlipidemia: Secondary | ICD-10-CM | POA: Diagnosis present

## 2020-10-30 DIAGNOSIS — J449 Chronic obstructive pulmonary disease, unspecified: Secondary | ICD-10-CM | POA: Diagnosis not present

## 2020-10-30 DIAGNOSIS — I6523 Occlusion and stenosis of bilateral carotid arteries: Secondary | ICD-10-CM | POA: Diagnosis present

## 2020-10-30 DIAGNOSIS — Z66 Do not resuscitate: Secondary | ICD-10-CM | POA: Diagnosis present

## 2020-10-30 DIAGNOSIS — Z7982 Long term (current) use of aspirin: Secondary | ICD-10-CM

## 2020-10-30 DIAGNOSIS — R0689 Other abnormalities of breathing: Secondary | ICD-10-CM | POA: Diagnosis not present

## 2020-10-30 DIAGNOSIS — R069 Unspecified abnormalities of breathing: Secondary | ICD-10-CM | POA: Diagnosis not present

## 2020-10-30 DIAGNOSIS — Z6837 Body mass index (BMI) 37.0-37.9, adult: Secondary | ICD-10-CM

## 2020-10-30 DIAGNOSIS — I959 Hypotension, unspecified: Secondary | ICD-10-CM | POA: Diagnosis not present

## 2020-10-30 DIAGNOSIS — R0602 Shortness of breath: Secondary | ICD-10-CM | POA: Diagnosis not present

## 2020-10-30 DIAGNOSIS — G4730 Sleep apnea, unspecified: Secondary | ICD-10-CM | POA: Diagnosis not present

## 2020-10-30 DIAGNOSIS — Z7901 Long term (current) use of anticoagulants: Secondary | ICD-10-CM

## 2020-10-30 DIAGNOSIS — J441 Chronic obstructive pulmonary disease with (acute) exacerbation: Secondary | ICD-10-CM

## 2020-10-30 DIAGNOSIS — I700 Atherosclerosis of aorta: Secondary | ICD-10-CM | POA: Diagnosis present

## 2020-10-30 DIAGNOSIS — J9622 Acute and chronic respiratory failure with hypercapnia: Secondary | ICD-10-CM | POA: Diagnosis not present

## 2020-10-30 DIAGNOSIS — F1010 Alcohol abuse, uncomplicated: Secondary | ICD-10-CM | POA: Diagnosis present

## 2020-10-30 DIAGNOSIS — I517 Cardiomegaly: Secondary | ICD-10-CM | POA: Diagnosis not present

## 2020-10-30 DIAGNOSIS — G4733 Obstructive sleep apnea (adult) (pediatric): Secondary | ICD-10-CM | POA: Diagnosis not present

## 2020-10-30 DIAGNOSIS — Z20822 Contact with and (suspected) exposure to covid-19: Secondary | ICD-10-CM | POA: Diagnosis present

## 2020-10-30 DIAGNOSIS — Z8249 Family history of ischemic heart disease and other diseases of the circulatory system: Secondary | ICD-10-CM

## 2020-10-30 DIAGNOSIS — J432 Centrilobular emphysema: Secondary | ICD-10-CM | POA: Diagnosis not present

## 2020-10-30 LAB — CBC WITH DIFFERENTIAL/PLATELET
Abs Immature Granulocytes: 0.06 10*3/uL (ref 0.00–0.07)
Basophils Absolute: 0.1 10*3/uL (ref 0.0–0.1)
Basophils Relative: 1 %
Eosinophils Absolute: 0.1 10*3/uL (ref 0.0–0.5)
Eosinophils Relative: 1 %
HCT: 42.2 % (ref 39.0–52.0)
Hemoglobin: 12.6 g/dL — ABNORMAL LOW (ref 13.0–17.0)
Immature Granulocytes: 1 %
Lymphocytes Relative: 10 %
Lymphs Abs: 1 10*3/uL (ref 0.7–4.0)
MCH: 28.7 pg (ref 26.0–34.0)
MCHC: 29.9 g/dL — ABNORMAL LOW (ref 30.0–36.0)
MCV: 96.1 fL (ref 80.0–100.0)
Monocytes Absolute: 0.4 10*3/uL (ref 0.1–1.0)
Monocytes Relative: 4 %
Neutro Abs: 8 10*3/uL — ABNORMAL HIGH (ref 1.7–7.7)
Neutrophils Relative %: 83 %
Platelets: 190 10*3/uL (ref 150–400)
RBC: 4.39 MIL/uL (ref 4.22–5.81)
RDW: 13 % (ref 11.5–15.5)
WBC: 9.7 10*3/uL (ref 4.0–10.5)
nRBC: 0 % (ref 0.0–0.2)

## 2020-10-30 LAB — COMPREHENSIVE METABOLIC PANEL
ALT: 14 U/L (ref 0–44)
AST: 17 U/L (ref 15–41)
Albumin: 3.7 g/dL (ref 3.5–5.0)
Alkaline Phosphatase: 49 U/L (ref 38–126)
Anion gap: 10 (ref 5–15)
BUN: 24 mg/dL — ABNORMAL HIGH (ref 8–23)
CO2: 37 mmol/L — ABNORMAL HIGH (ref 22–32)
Calcium: 9 mg/dL (ref 8.9–10.3)
Chloride: 92 mmol/L — ABNORMAL LOW (ref 98–111)
Creatinine, Ser: 1.01 mg/dL (ref 0.61–1.24)
GFR, Estimated: >60 mL/min (ref >60)
Glucose, Bld: 149 mg/dL — ABNORMAL HIGH (ref 70–99)
Potassium: 4.9 mmol/L (ref 3.5–5.1)
Sodium: 139 mmol/L (ref 135–145)
Total Bilirubin: 0.7 mg/dL (ref 0.3–1.2)
Total Protein: 7.3 g/dL (ref 6.5–8.1)

## 2020-10-30 LAB — RESP PANEL BY RT-PCR (FLU A&B, COVID) ARPGX2
Influenza A by PCR: NEGATIVE
Influenza B by PCR: NEGATIVE
SARS Coronavirus 2 by RT PCR: NEGATIVE

## 2020-10-30 LAB — BRAIN NATRIURETIC PEPTIDE: B Natriuretic Peptide: 236.6 pg/mL — ABNORMAL HIGH (ref 0.0–100.0)

## 2020-10-30 LAB — TROPONIN I (HIGH SENSITIVITY)
Troponin I (High Sensitivity): 7 ng/L (ref <18)
Troponin I (High Sensitivity): 9 ng/L (ref <18)

## 2020-10-30 MED ORDER — DILTIAZEM HCL-DEXTROSE 125-5 MG/125ML-% IV SOLN (PREMIX)
5.0000 mg/h | INTRAVENOUS | Status: DC
  Administered 2020-10-30: 5 mg/h via INTRAVENOUS
  Filled 2020-10-30: qty 125

## 2020-10-30 MED ORDER — TAMSULOSIN HCL 0.4 MG PO CAPS
0.4000 mg | ORAL_CAPSULE | Freq: Every day | ORAL | Status: DC
  Administered 2020-10-31: 0.4 mg via ORAL
  Filled 2020-10-30: qty 1

## 2020-10-30 MED ORDER — ONDANSETRON HCL 4 MG/2ML IJ SOLN: 4.0000 mg | Freq: Four times a day (QID) | INTRAMUSCULAR | Status: DC | PRN

## 2020-10-30 MED ORDER — SODIUM CHLORIDE 0.9 % IV SOLN: INTRAVENOUS | Status: DC

## 2020-10-30 MED ORDER — DILTIAZEM HCL-DEXTROSE 125-5 MG/125ML-% IV SOLN (PREMIX): 5.0000 mg/h | INTRAVENOUS | Status: DC

## 2020-10-30 MED ORDER — APIXABAN 5 MG PO TABS
5.0000 mg | ORAL_TABLET | Freq: Two times a day (BID) | ORAL | Status: DC
  Administered 2020-10-31 – 2020-11-02 (×5): 5 mg via ORAL
  Filled 2020-10-30 (×6): qty 1

## 2020-10-30 MED ORDER — SODIUM CHLORIDE 0.9 % IV BOLUS
250.0000 mL | Freq: Once | INTRAVENOUS | Status: AC
  Administered 2020-10-30: 250 mL via INTRAVENOUS

## 2020-10-30 MED ORDER — ACETAMINOPHEN 650 MG RE SUPP: 650.0000 mg | Freq: Four times a day (QID) | RECTAL | Status: DC | PRN

## 2020-10-30 MED ORDER — METHYLPREDNISOLONE SODIUM SUCC 40 MG IJ SOLR
40.0000 mg | Freq: Four times a day (QID) | INTRAMUSCULAR | Status: AC
  Administered 2020-10-30 – 2020-10-31 (×4): 40 mg via INTRAVENOUS
  Filled 2020-10-30 (×4): qty 1

## 2020-10-30 MED ORDER — ISOSORBIDE MONONITRATE ER 60 MG PO TB24: 30.0000 mg | ORAL_TABLET | Freq: Every day | ORAL | Status: DC

## 2020-10-30 MED ORDER — ASPIRIN EC 325 MG PO TBEC
325.0000 mg | DELAYED_RELEASE_TABLET | Freq: Every day | ORAL | Status: DC
Start: 2020-10-30 — End: 2020-10-30

## 2020-10-30 MED ORDER — CLOPIDOGREL BISULFATE 75 MG PO TABS
75.0000 mg | ORAL_TABLET | Freq: Every day | ORAL | Status: DC
Start: 2020-10-31 — End: 2020-11-02
  Administered 2020-10-31 – 2020-11-02 (×3): 75 mg via ORAL
  Filled 2020-10-30 (×3): qty 1

## 2020-10-30 MED ORDER — DOCUSATE SODIUM 100 MG PO CAPS
100.0000 mg | ORAL_CAPSULE | Freq: Two times a day (BID) | ORAL | Status: DC
  Administered 2020-10-31 – 2020-11-02 (×5): 100 mg via ORAL
  Filled 2020-10-30 (×6): qty 1

## 2020-10-30 MED ORDER — TRAZODONE HCL 50 MG PO TABS
25.0000 mg | ORAL_TABLET | Freq: Every evening | ORAL | Status: DC | PRN
  Administered 2020-10-31 – 2020-11-01 (×2): 25 mg via ORAL
  Filled 2020-10-30 (×2): qty 1

## 2020-10-30 MED ORDER — LISINOPRIL 10 MG PO TABS: 20.0000 mg | ORAL_TABLET | Freq: Every day | ORAL | Status: DC

## 2020-10-30 MED ORDER — ROSUVASTATIN CALCIUM 10 MG PO TABS
10.0000 mg | ORAL_TABLET | Freq: Every day | ORAL | Status: DC
  Administered 2020-10-31 – 2020-11-02 (×3): 10 mg via ORAL
  Filled 2020-10-30 (×3): qty 1

## 2020-10-30 MED ORDER — ONDANSETRON HCL 4 MG PO TABS: 4.0000 mg | ORAL_TABLET | Freq: Four times a day (QID) | ORAL | Status: DC | PRN

## 2020-10-30 MED ORDER — IPRATROPIUM-ALBUTEROL 0.5-2.5 (3) MG/3ML IN SOLN
3.0000 mL | Freq: Four times a day (QID) | RESPIRATORY_TRACT | Status: DC
  Administered 2020-10-30: 3 mL via RESPIRATORY_TRACT

## 2020-10-30 MED ORDER — HYDROCHLOROTHIAZIDE 12.5 MG PO CAPS: 12.5000 mg | ORAL_CAPSULE | Freq: Every day | ORAL | Status: DC

## 2020-10-30 MED ORDER — CALCIUM CARBONATE-VITAMIN D 500-200 MG-UNIT PO TABS
1.0000 | ORAL_TABLET | Freq: Every day | ORAL | Status: DC
  Administered 2020-11-01 – 2020-11-02 (×2): 1 via ORAL
  Filled 2020-10-30 (×4): qty 1

## 2020-10-30 MED ORDER — MAGNESIUM HYDROXIDE 400 MG/5ML PO SUSP: 30.0000 mL | Freq: Every day | ORAL | Status: DC | PRN

## 2020-10-30 MED ORDER — ACETAMINOPHEN 325 MG PO TABS: 650.0000 mg | ORAL_TABLET | Freq: Four times a day (QID) | ORAL | Status: DC | PRN

## 2020-10-30 MED ORDER — SODIUM CHLORIDE 0.9 % IV SOLN
1.0000 g | Freq: Every day | INTRAVENOUS | Status: DC
  Administered 2020-10-30 – 2020-11-01 (×3): 1 g via INTRAVENOUS
  Filled 2020-10-30: qty 10
  Filled 2020-10-30 (×2): qty 1

## 2020-10-30 MED ORDER — LISINOPRIL-HYDROCHLOROTHIAZIDE 20-12.5 MG PO TABS: 1.0000 | ORAL_TABLET | Freq: Every day | ORAL | Status: DC

## 2020-10-30 MED ORDER — PREDNISONE 20 MG PO TABS
40.0000 mg | ORAL_TABLET | Freq: Every day | ORAL | Status: DC
  Administered 2020-11-01 – 2020-11-02 (×2): 40 mg via ORAL
  Filled 2020-10-30 (×2): qty 2

## 2020-10-30 NOTE — ED Provider Notes (Signed)
[facility] Emergency Department Provider Note   ____________________________________________   Event Date/Time   First MD Initiated Contact with Patient 10/30/20 1444     (approximate)  I have reviewed the triage vital signs and the nursing notes.   HISTORY  Chief Complaint Shortness of Breath    HPI Robert Powell is a 70 y.o. male with a past medical history of tobacco abuse, alcohol abuse, hypertension, and COPD on chronic 2 L of O2 who presents via EMS for worsening shortness of breath.  EMS state that they found patient in the 70s on his normal 2 L.  Patient was counseled by his home health nurse to increase his oxygen to 3 L but continued to complain of worsening shortness of breath.  Patient was started on CPAP by EMS as well as given 2 DuoNeb/2 mg magnesium/125 mg Solu-Medrol.  Patient does also complain of nonradiating chest tightness associated with the shortness of breath.  Patient does endorse improvement in his symptoms after treatment by EMS.  Patient currently denies any vision changes, tinnitus, difficulty speaking, facial droop, sore throat, abdominal pain, nausea/vomiting/diarrhea, dysuria, or weakness/numbness/paresthesias in any extremity         Past Medical History:  Diagnosis Date  . AA (alcohol abuse) 02/28/2014  . Adenocarcinoma of prostate ([place]) 05/22/2015  . Alcohol abuse   . Apnea, sleep 02/28/2014  . Benign essential HTN 02/17/2015  . 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 Cancer ([place])   . Cellulitis 09/21/2014  . Chronic obstructive pulmonary disease ([place]) 04/28/2014  . 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 COPD (chronic obstructive pulmonary disease) ([place])   . Elevated prostate specific antigen (PSA) 02/16/2015  . Elevated PSA    6.7 on 02/16/2015  . Essential (primary) hypertension 02/18/2015  . Genital herpes   . Hyperlipidemia   . Hypertension   . Lesion of external ear  04/28/2014  . Narcolepsy   . Rhinitis   . Sleep apnea    histort had surgery better  . Tobacco abuse     Patient Active Problem List   Diagnosis Date Noted  . Chronic respiratory failure with hypoxia ([place]) 03/25/2019  . Chronic a-fib ([place]) 04/16/2018  . Moderate tricuspid insufficiency 12/08/2017  . Hyponatremia 04/16/2017  . CAD (coronary artery disease) 05/23/2016  . Unstable angina ([place]) 05/20/2016  . Continuous chronic alcoholism ([place]) 09/26/2015  . History of alcoholism ([place]) 09/26/2015  . Adenocarcinoma of prostate ([place]) 05/22/2015  . 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 Elevated PSA 03/14/2015  . BPH (benign prostatic hyperplasia) 03/14/2015  . Essential (primary) hypertension 02/18/2015  . Benign essential HTN 02/17/2015  . Elevated prostate specific antigen (PSA) 02/16/2015  . Cellulitis 09/21/2014  . Breathlessness on exertion 08/29/2014  . Chronic obstructive pulmonary disease ([place]) 04/28/2014  . Lesion of external ear 04/28/2014  . AA (alcohol abuse) 02/28/2014  . 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 Apnea, sleep 02/28/2014  . Current tobacco use 02/28/2014    Past Surgical History:  Procedure Laterality Date  . CARDIAC CATHETERIZATION Left 05/23/2016   Procedure: Left Heart Cath and Coronary Angiography;  Surgeon: Corey Skains, MD;  Location: [facility];  Service: Cardiovascular;  Laterality: Left;  . CARDIAC CATHETERIZATION N/A 05/23/2016   Procedure: Coronary Stent Intervention;  Surgeon: Isaias Cowman, MD;  Location: [facility];  Service: Cardiovascular;  Laterality: N/A;  . RADIOACTIVE SEED IMPLANT N/A 11/20/2015  Procedure: RADIOACTIVE SEED IMPLANT/BRACHYTHERAPY IMPLANT;  Surgeon: Hollice Espy, MD;  Location: [facility];  Service: Urology;  Laterality: N/A;  . TONSILLECTOMY      Prior to Admission medications   Medication Sig Start Date End Date Taking? Authorizing  Provider  albuterol (VENTOLIN HFA) 108 (90 BASE) MCG/ACT inhaler Inhale into the lungs. 02/22/15   [provider]  amLODipine (NORVASC) 5 MG tablet  05/26/18   [provider]  apixaban (ELIQUIS) 5 MG TABS tablet Take 1 tablet (5 mg total) by mouth 2 (two) times daily. 10/21/17   Hinda Kehr, MD  aspirin EC 325 MG EC tablet Take 1 tablet (325 mg total) by mouth daily. 05/24/16   Corey Skains, MD  Budesonide 90 MCG/ACT inhaler Inhale into the lungs. 10/15/16 11/16/19  [provider]  Calcium Carbonate-Vitamin D (CALCIUM-VITAMIN D) 500-200 MG-UNIT tablet Take by mouth.    [provider]  Cholecalciferol (VITAMIN D-1000 MAX ST) 1000 units tablet Take by mouth.    [provider]  clopidogrel (PLAVIX) 75 MG tablet Take 1 tablet (75 mg total) by mouth daily with breakfast. 05/24/16   Corey Skains, MD  diltiazem (CARDIZEM CD) 240 MG 24 hr capsule Take by mouth. 10/31/17 11/16/19  [provider]  docusate sodium (COLACE) 100 MG capsule Take 1 capsule (100 mg total) by mouth 2 (two) times daily. 11/20/15   Hollice Espy, MD  Fluticasone-Umeclidin-Vilant (TRELEGY ELLIPTA) 100-62.5-25 MCG/INH AEPB Inhale 1 spray into the lungs daily. 11/11/19   [provider]  furosemide (LASIX) 20 MG tablet Take 1 tablet by mouth 2 (two) times daily as needed. 10/19/19   [provider]  ipratropium-albuterol (DUONEB) 0.5-2.5 (3) MG/3ML SOLN  06/14/15   [provider]  isosorbide mononitrate (IMDUR) 30 MG 24 hr tablet Take by mouth. 05/31/16 11/16/19  [provider]  lisinopril-hydrochlorothiazide (PRINZIDE,ZESTORETIC) 20-12.5 MG per tablet Take by mouth. 10/05/14 11/16/19  [provider]  rosuvastatin (CRESTOR) 10 MG tablet Take by mouth. 05/31/16 11/16/19  [provider]  tamsulosin (FLOMAX) 0.4 MG CAPS capsule Take 1 capsule (0.4 mg total) by mouth daily. 11/20/15   Hollice Espy, MD  tiotropium (SPIRIVA) 18 MCG  inhalation capsule Place into inhaler and inhale. 02/09/15 11/16/19  [provider]    Allergies Patient has no known allergies.  Family History  Problem Relation Age of Onset  . Heart attack Mother   . Heart disease Mother   . Heart attack Father   . Kidney disease Neg Hx   . Prostate cancer Neg Hx   . Kidney cancer Neg Hx   . Bladder Cancer Neg Hx     Social History Social History   Tobacco Use  . Smoking status: Current Every Day Smoker    Packs/day: 1.50  . Smokeless tobacco: Never Used  Substance Use Topics  . Alcohol use: Yes    Alcohol/week: 8.0 standard drinks    Types: 8 Glasses of wine per week    Comment: occ  . Drug use: No    Review of Systems Constitutional: No fever/chills Eyes: No visual changes. ENT: No sore throat. Cardiovascular: Endorses chest pain. Respiratory: Endorses shortness of breath. Gastrointestinal: No abdominal pain.  No nausea, no vomiting.  No diarrhea. Genitourinary: Negative for dysuria. Musculoskeletal: Negative for acute arthralgias Skin: Negative for rash. Neurological: Negative for headaches, weakness/numbness/paresthesias in any extremity Psychiatric: Negative for suicidal ideation/homicidal ideation   ____________________________________________   PHYSICAL EXAM:  VITAL SIGNS: ED Triage Vitals [10/30/20 [place]  Enc Vitals Group     BP      Pulse      Resp      Temp      Temp src      SpO2      Weight 273 lb 9.5 oz (124.1 kg)     Height 6' (1.829 m)     Head Circumference      Peak Flow      Pain Score 0     Pain Loc      Pain Edu?      Excl. in [place]?    Constitutional: Alert and oriented. Well appearing and in no acute distress. Eyes: Conjunctivae are normal. PERRL. Head: Atraumatic. Nose: No congestion/rhinnorhea. Mouth/Throat: Mucous membranes are moist. Neck: No stridor Cardiovascular: Grossly normal heart sounds.  Good peripheral circulation. Respiratory: Increased respiratory effort with use of  abdominal muscles.  Tachypneic.  Wheezing heard over bilateral lung fields. Gastrointestinal: Soft and nontender. No distention. Musculoskeletal: No obvious deformities Neurologic:  Normal speech and language. No gross focal neurologic deficits are appreciated. Skin:  Skin is warm and dry. No rash noted. Psychiatric: Mood and affect are normal. Speech and behavior are normal.  ____________________________________________   LABS (all labs ordered are listed, but only abnormal results are displayed)  Labs Reviewed  CBC WITH DIFFERENTIAL/PLATELET - Abnormal; Notable for the following components:      Result Value   Hemoglobin 12.6 (*)    MCHC 29.9 (*)    Neutro Abs 8.0 (*)    All other components within normal limits  COMPREHENSIVE METABOLIC PANEL - Abnormal; Notable for the following components:   Chloride 92 (*)    CO2 37 (*)    Glucose, Bld 149 (*)    BUN 24 (*)    All other components within normal limits  BRAIN NATRIURETIC PEPTIDE - Abnormal; Notable for the following components:   B Natriuretic Peptide 236.6 (*)    All other components within normal limits  RESP PANEL BY RT-PCR (FLU A&B, COVID) ARPGX2  TROPONIN I (HIGH SENSITIVITY)  TROPONIN I (HIGH SENSITIVITY)   ____________________________________________  EKG  ED ECG REPORT I, Naaman Plummer, the attending physician, personally viewed and interpreted this ECG.  Date: 10/30/2020 EKG Time: 1450 Rate: 159 Rhythm: A. fib with RVR QRS Axis: normal Intervals: normal ST/T Wave abnormalities: normal Narrative Interpretation: no evidence of acute ischemia  ____________________________________________  RADIOLOGY  ED MD interpretation: One-view portable chest x-ray shows no evidence of acute abnormalities including no pneumonia, pneumothorax, or widened mediastinum  Official radiology report(s): DG Chest Port 1 View  Result Date: 10/30/2020 CLINICAL DATA:  Shortness of breath EXAM: PORTABLE CHEST 1 VIEW  COMPARISON:  10/21/2017 FINDINGS: No consolidation or effusion. Emphysematous disease with bronchitic changes at the bases. Borderline cardiomegaly with aortic atherosclerosis. No pneumothorax. IMPRESSION: No active disease. Emphysematous disease with bronchitic changes at the bases. Electronically Signed   By: Donavan Foil M.D.   On: 10/30/2020 15:30    ____________________________________________   PROCEDURES  Procedure(s) performed (including Critical Care):  .Critical Care Performed by: Naaman Plummer, MD Authorized by: Naaman Plummer, MD   Critical care provider statement:    Critical care time (minutes):  37   Critical care time was exclusive of:  Separately billable procedures and treating other patients   Critical care was necessary to treat or prevent imminent or life-threatening deterioration of the following conditions:  Cardiac failure and respiratory failure   Critical care was time spent personally  by me on the following activities:  Discussions with consultants, evaluation of patient's response to treatment, examination of patient, ordering and performing treatments and interventions, ordering and review of laboratory studies, ordering and review of radiographic studies, pulse oximetry, re-evaluation of patient's condition, obtaining history from patient or surrogate and review of old charts   I assumed direction of critical care for this patient from another provider in my specialty: no     Care discussed with: admitting provider   .1-3 Lead EKG Interpretation Performed by: Naaman Plummer, MD Authorized by: Naaman Plummer, MD     Interpretation: abnormal     ECG rate:  115   ECG rate assessment: tachycardic     Rhythm: atrial fibrillation     Ectopy: none     Conduction: normal       ____________________________________________   INITIAL IMPRESSION / ASSESSMENT AND PLAN / ED COURSE  As part of my medical decision making, I reviewed the following data  within the [place] notes reviewed and incorporated, Labs reviewed, EKG interpreted, Old chart reviewed, Radiograph reviewed and Notes from prior ED visits reviewed and incorporated        The patient appears to be suffering from a moderate/severe exacerbation of COPD.  Based on the history, exam, CXR/EKG reviewed by me, and further workup I dont suspect any other emergent cause of this presentation, such as pneumonia, acute coronary syndrome, congestive heart failure, pulmonary embolism, or pneumothorax. Patient also has evidence of atrial fibrillation with rapid ventricular response being treated with IV diltiazem drip ED Interventions: bronchodilators, steroids, antibiotics, reassess  Reassessment: After treatment, the patients shortness of breath is improving but patient is still requiring supplemental oxygenation with BiPAP  Disposition: Admit      ____________________________________________   FINAL CLINICAL IMPRESSION(S) / ED DIAGNOSES  Final diagnoses:  None     ED Discharge Orders    None       Note:  This document was prepared using Dragon voice recognition software and may include unintentional dictation errors.   Naaman Plummer, MD 10/30/20 [phone]

## 2020-10-30 NOTE — ED Notes (Signed)
Medication Reconciliation Report  For Home History Technicians  HIGHLIGHTS:  1. The patient WAS NOT personally interviewed 2. If not, what was the main source used: PHARMACY RECORDS 3. Does the patient appear to take any anti-coagulation agents (e.g. warfarin, Eliquis or Xarelto): UNKNOWN 4. Does the patient appear to take any anti-convulsant agents (e.g. divalproex, levetiracetam or phenytoin): NO 5. Does the patient appear to use any insulin products (e.g. Lantus, Novolin or Humalog): NO 6. Does the patient appear to take any "beta-blockers" (e.g. metoprolol, carvedilol or bisoprolol: NO  BARRIERS:  1. Were there any barriers that prevented or complicated the medication reconciliation process: YES 2. If yes, what was the primary barrier encountered: Condition prevents interview 3. Does the patient appear compliant with prescribed medications: UNABLE TO DETERMINE 4. Does the patient express any barriers with compliance: UNABLE TO DETERMINE 5. What is the primary barrier the patient reports: None   NOTES:[Include any concerns, remarks or complaints the patient expresses regarding medication therapy. Any observations or other information that might be useful to the treatment team can also be included. Immediate needs or concerns should be referred to the RN or appropriate member of the treatment team.]  The patient was not interviewed secondary to BiPAP and patient being asleep. Some medications/bottles were in the room with patient, including an empty ELIQUIS bottle -- last pharmacy dispense 08/01/2020 for 30 day supply. Patient has tablets of ISOSORBIDE ER 60 (last 05/31/2020 for 90 days) and a bottle for DILTIAZEM CD 240 (last 07/03/2020 for 90 days). Contacted spouse who was unable to confirm or deny current ELIQUIS use.   Colen Darling, CPhT [place] [facility] [place]. [place], [place] [address] [phone]  ** The above is intended solely for  informational and/or communicative purposes. It should in no way be considered an endorsement of any specific treatment, therapy or action. **

## 2020-10-30 NOTE — Consult Note (Signed)
Cardiology Consultation Note    Patient ID: Robert Powell, MRN: [id number], DOB/AGE: November 27, 1950 70 y.o. Admit date: 10/30/2020   Date of Consult: 10/30/2020 Primary Physician: Pcp, No Primary Cardiologist: Dr. Nehemiah Massed  Chief Complaint: sob Reason for Consultation: afib with rvr Requesting MD: Dr. Sidney Ace  HPI: Robert Powell is a 70 y.o. male with history of tobacco abuse, ethanol abuse, hypertension, chronic atrial fibrillation at least since 2020 anticoagulated with Eliquis and rate controlled with diltiazem 240 mg daily, coronary artery disease status post PCI and stent to the LAD in 2017 with residual 80% stenosis in left circumflex, carotid artery disease with a left internal carotid artery 50 to 69% emphysema on chronic 2 L of oxygen who presented via EMS with increasing shortness of breath.  Patient was noted to have a pulse ox in the 70s on 2 L.  He was started on BiPAP and brought to the emergency room after being given duo nebs and Solu-Medrol.  He complains of nonradiating chest pain associated with the shortness of breath.  EKG showed atrial fibrillation with variable but fairly rapid ventricular response.   Patient is ruled out for myocardial infarction with negative serum troponins x2.  BNP is mildly elevated at 236.6.  Potassium was 4.9.  Renal function was normal.  Chest x-ray showed no active disease with emphysema changes.  He was placed on a Cardizem drip.  He was also given Imdur 30 mg daily and lisinopril-hydrochlorothiazide 20-12.5 mg daily.  He remains on rosuvastatin at 10 mg daily.  He is on bronchodilators and methylprednisolone.  Past Medical History:  Diagnosis Date  . AA (alcohol abuse) 02/28/2014  . Adenocarcinoma of prostate ([place]) 05/22/2015  . Alcohol abuse   . Apnea, sleep 02/28/2014  . Benign essential HTN 02/17/2015  . 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 Cancer ([place])   . Cellulitis 09/21/2014  . Chronic obstructive pulmonary disease  ([place]) 04/28/2014  . 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 COPD (chronic obstructive pulmonary disease) ([place])   . Elevated prostate specific antigen (PSA) 02/16/2015  . Elevated PSA    6.7 on 02/16/2015  . Essential (primary) hypertension 02/18/2015  . Genital herpes   . Hyperlipidemia   . Hypertension   . Lesion of external ear 04/28/2014  . Narcolepsy   . Rhinitis   . Sleep apnea    histort had surgery better  . Tobacco abuse       Surgical History:  Past Surgical History:  Procedure Laterality Date  . CARDIAC CATHETERIZATION Left 05/23/2016   Procedure: Left Heart Cath and Coronary Angiography;  Surgeon: Corey Skains, MD;  Location: [facility];  Service: Cardiovascular;  Laterality: Left;  . CARDIAC CATHETERIZATION N/A 05/23/2016   Procedure: Coronary Stent Intervention;  Surgeon: Isaias Cowman, MD;  Location: [facility];  Service: Cardiovascular;  Laterality: N/A;  . RADIOACTIVE SEED IMPLANT N/A 11/20/2015   Procedure: RADIOACTIVE SEED IMPLANT/BRACHYTHERAPY IMPLANT;  Surgeon: Hollice Espy, MD;  Location: [facility];  Service: Urology;  Laterality: N/A;  . TONSILLECTOMY       Home Meds: Prior to Admission medications   Medication Sig Start Date End Date Taking? Authorizing Provider  albuterol (VENTOLIN HFA) 108 (90 BASE) MCG/ACT inhaler Inhale into the lungs. 02/22/15   [provider]  amLODipine (NORVASC) 5 MG tablet  05/26/18   [provider]  apixaban (ELIQUIS) 5 MG TABS tablet Take 1 tablet (5 mg total) by mouth 2 (  two) times daily. 10/21/17   Hinda Kehr, MD  aspirin EC 325 MG EC tablet Take 1 tablet (325 mg total) by mouth daily. 05/24/16   Corey Skains, MD  Budesonide 90 MCG/ACT inhaler Inhale into the lungs. 10/15/16 11/16/19  [provider]  Calcium Carbonate-Vitamin D (CALCIUM-VITAMIN D) 500-200 MG-UNIT tablet Take by mouth.    [provider]  Cholecalciferol (VITAMIN D-1000 MAX  ST) 1000 units tablet Take by mouth.    [provider]  clopidogrel (PLAVIX) 75 MG tablet Take 1 tablet (75 mg total) by mouth daily with breakfast. 05/24/16   Corey Skains, MD  diltiazem (CARDIZEM CD) 240 MG 24 hr capsule Take by mouth. 10/31/17 11/16/19  [provider]  docusate sodium (COLACE) 100 MG capsule Take 1 capsule (100 mg total) by mouth 2 (two) times daily. 11/20/15   Hollice Espy, MD  Fluticasone-Umeclidin-Vilant (TRELEGY ELLIPTA) 100-62.5-25 MCG/INH AEPB Inhale 1 spray into the lungs daily. 11/11/19   [provider]  furosemide (LASIX) 20 MG tablet Take 1 tablet by mouth 2 (two) times daily as needed. 10/19/19   [provider]  ipratropium-albuterol (DUONEB) 0.5-2.5 (3) MG/3ML SOLN  06/14/15   [provider]  isosorbide mononitrate (IMDUR) 30 MG 24 hr tablet Take by mouth. 05/31/16 11/16/19  [provider]  lisinopril-hydrochlorothiazide (PRINZIDE,ZESTORETIC) 20-12.5 MG per tablet Take by mouth. 10/05/14 11/16/19  [provider]  rosuvastatin (CRESTOR) 10 MG tablet Take by mouth. 05/31/16 11/16/19  [provider]  tamsulosin (FLOMAX) 0.4 MG CAPS capsule Take 1 capsule (0.4 mg total) by mouth daily. 11/20/15   Hollice Espy, MD  tiotropium (SPIRIVA) 18 MCG inhalation capsule Place into inhaler and inhale. 02/09/15 11/16/19  [provider]    Inpatient Medications:  . apixaban  5 mg Oral BID  . aspirin  325 mg Oral Daily  . calcium-vitamin D  1 tablet Oral Daily  . [START ON 10/31/2020] clopidogrel  75 mg Oral Q breakfast  . docusate sodium  100 mg Oral BID  . isosorbide mononitrate  30 mg Oral Daily  . lisinopril-hydrochlorothiazide  1 tablet Oral Daily  . methylPREDNISolone (SOLU-MEDROL) injection  40 mg Intravenous Q6H   Followed by  . [START ON 11/01/2020] predniSONE  40 mg Oral Q breakfast  . rosuvastatin  10 mg Oral Daily  . tamsulosin  0.4 mg Oral Daily   . sodium chloride 100 mL/hr at 10/30/20  1706  . cefTRIAXone (ROCEPHIN)  IV    . diltiazem (CARDIZEM) infusion 15 mg/hr (10/30/20 1706)    Allergies: No Known Allergies  Social History   Socioeconomic History  . Marital status: Married    Spouse name: Not on file  . Number of children: Not on file  . Years of education: Not on file  . Highest education level: Not on file  Occupational History  . Not on file  Tobacco Use  . Smoking status: Current Every Day Smoker    Packs/day: 1.50  . Smokeless tobacco: Never Used  Substance and Sexual Activity  . Alcohol use: Yes    Alcohol/week: 8.0 standard drinks    Types: 8 Glasses of wine per week    Comment: occ  . Drug use: No  . Sexual activity: Not on file  Other Topics Concern  . Not on file  Social History Narrative  . Not on file   Social Determinants of Health   Financial Resource Strain: Not on file  Food Insecurity: Not on file  Transportation  Needs: Not on file  Physical Activity: Not on file  Stress: Not on file  Social Connections: Not on file  Intimate Partner Violence: Not on file     Family History  Problem Relation Age of Onset  . Heart attack Mother   . Heart disease Mother   . Heart attack Father   . Kidney disease Neg Hx   . Prostate cancer Neg Hx   . Kidney cancer Neg Hx   . Bladder Cancer Neg Hx      Review of Systems: A 12-system review of systems was performed and is negative except as noted in the HPI.  Labs: No results for input(s): CKTOTAL, CKMB, TROPONINI in the last 72 hours. Lab Results  Component Value Date   WBC 9.7 10/30/2020   HGB 12.6 (L) 10/30/2020   HCT 42.2 10/30/2020   MCV 96.1 10/30/2020   PLT 190 10/30/2020    Recent Labs  Lab 10/30/20 1446  NA 139  K 4.9  CL 92*  CO2 37*  BUN 24*  CREATININE 1.01  CALCIUM 9.0  PROT 7.3  BILITOT 0.7  ALKPHOS 49  ALT 14  AST 17  GLUCOSE 149*   No results found for: CHOL, HDL, LDLCALC, TRIG No results found for: DDIMER  Radiology/Studies:  DG Chest Port 1  View  Result Date: 10/30/2020 CLINICAL DATA:  Shortness of breath EXAM: PORTABLE CHEST 1 VIEW COMPARISON:  10/21/2017 FINDINGS: No consolidation or effusion. Emphysematous disease with bronchitic changes at the bases. Borderline cardiomegaly with aortic atherosclerosis. No pneumothorax. IMPRESSION: No active disease. Emphysematous disease with bronchitic changes at the bases. Electronically Signed   By: Donavan Foil M.D.   On: 10/30/2020 15:30    Wt Readings from Last 3 Encounters:  10/30/20 124.1 kg  11/16/19 124.1 kg  11/18/18 112.7 kg    EKG: Atrial fibrillation with rapid ventricular response.  No ischemia.  Physical Exam:  Blood pressure 118/71, pulse (!) 145, temperature 99.6 F (37.6 C), temperature source Axillary, resp. rate 20, height 6' (1.829 m), weight 124.1 kg, SpO2 91 %. Body mass index is 37.11 kg/m. General: Well developed, well nourished, in no acute distress. Head: Normocephalic, atraumatic, sclera non-icteric, no xanthomas, nares are without discharge.  Neck: Negative for carotid bruits. JVD not elevated. Lungs: Bilateral rhonchi and wheezes. Heart: Irregular regular rhythm Abdomen: Soft, non-tender, non-distended with normoactive bowel sounds. No hepatomegaly. No rebound/guarding. No obvious abdominal masses. Msk:  Strength and tone appear normal for age. Extremities: No clubbing or cyanosis. No edema.  Distal pedal pulses are 2+ and equal bilaterally. Neuro: Alert and oriented X 3. No facial asymmetry. No focal deficit. Moves all extremities spontaneously. Psych:  Responds to questions appropriately with a normal affect.     Assessment and Plan   1. Chronic a-fib (CMS-HCC)  Anticoagulated on Eliquis 5 mg BID. No bleeding symptoms reported. Continue Eliquis for stroke risk reduction. Patient currently on diltiazem 240 mg daily for heart rate control as outpatient. Heart rate 115-130 BPM on EKG, and will continue with IV Cardizem for now until rate is improved  and then resume p.o. Cardizem.  2. Coronary artery disease involving native coronary artery of native heart without angina pectoris S/P percutaneous coronary angioplasty: On aspirin, statin, and ACE. 81 mg daily for reduction of bleeding risk.Defer further imaging at present time.  3. Leg swelling:   Likely secondary to pulmonary hypertension due to his emphysema and A. fib.  No evidence of CHF on chest x-ray.  We will  continue with hydrochlorothiazide for now and follow.  4. Benign essential hypertension: Stable, continue with lisinopril hydrochlorothiazide and Imdur as well as IV Cardizem for now.  5. Bilateral carotid artery stenosis: Doppler from 2017 with 50-69% stenosis of left ICA.  6. Atherosclerosis of abdominal aorta (CMS-HCC): As seen on abdominal US from 2017. Continue aspirin and statin.   7. Hyperlipidemia, mixed:  Continue Crestor  8.  Emphysema-acute exacerbation of chronic airspace disease.  Continue with bronchodilators and empiric antibiotics.  Will follow with you.    Signed, Teodoro Spray MD 10/30/2020, 5:20 PM Pager: [phone]

## 2020-10-30 NOTE — H&P (Signed)
[place]   PATIENT NAME: Robert Powell    MR#:  [id number]  DATE OF BIRTH:  09-25-1950  DATE OF ADMISSION:  10/30/2020  PRIMARY CARE PHYSICIAN: Pcp, No   Patient is coming from:Home.  REQUESTING/REFERRING PHYSICIAN: Naaman Plummer, MD  CHIEF COMPLAINT:   Chief Complaint  Patient presents with  . Shortness of Breath    HISTORY OF PRESENT ILLNESS:  Robert Powell is a 69 y.o. male with medical history significant for multiple medical problems that are mentioned below, who presented to emergency room with acute onset of dyspnea with a stated productive cough as well as wheezing for the last 5 days.  He denied any fever or chills.  He admitted to palpitations and mild chest discomfort briefly today.  He denied any nausea or vomiting or abdominal pain.  No dysuria, oliguria or hematuria or flank pain.  ED Course: Heart rate was 104 with respiratory to 26 and temperature 99.6 with a pulse oximetry of her percent on BiPAP.  Labs revealed unremarkable CMP.  BNP was 236.6.  High-sensitivity troponin I was 70 letter 9.  CBC was unremarkable.  Influenza antigens and COVID-19  PCR came back negative.  He was in atrial fibrillation with rapid response.  Imaging:  Chest x-ray showed emphysematous disease with bronchitic changes at the bases with no active/acute cardiopulmonary disease.  Patient was given 2 DuoNeb's, 2 g of IV magnesium sulfate and 125 mg of IV Solu-Medrol by EMS in the ER he was placed on IV Cardizem with a bolus and drip.  He will be admitted to a progressive unit bed for further evaluation and management. PAST MEDICAL HISTORY:   Past Medical History:  Diagnosis Date  . AA (alcohol abuse) 02/28/2014  . Adenocarcinoma of prostate ([place]) 05/22/2015  . Alcohol abuse   . Apnea, sleep 02/28/2014  . Benign essential HTN 02/17/2015  . 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 Cancer ([place])   . Cellulitis 09/21/2014  . Chronic obstructive pulmonary  disease ([place]) 04/28/2014  . 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 COPD (chronic obstructive pulmonary disease) ([place])   . Elevated prostate specific antigen (PSA) 02/16/2015  . Elevated PSA    6.7 on 02/16/2015  . Essential (primary) hypertension 02/18/2015  . Genital herpes   . Hyperlipidemia   . Hypertension   . Lesion of external ear 04/28/2014  . Narcolepsy   . Rhinitis   . Sleep apnea    histort had surgery better  . Tobacco abuse     PAST SURGICAL HISTORY:   Past Surgical History:  Procedure Laterality Date  . CARDIAC CATHETERIZATION Left 05/23/2016   Procedure: Left Heart Cath and Coronary Angiography;  Surgeon: Corey Skains, MD;  Location: [facility];  Service: Cardiovascular;  Laterality: Left;  . CARDIAC CATHETERIZATION N/A 05/23/2016   Procedure: Coronary Stent Intervention;  Surgeon: Isaias Cowman, MD;  Location: [facility];  Service: Cardiovascular;  Laterality: N/A;  . RADIOACTIVE SEED IMPLANT N/A 11/20/2015   Procedure: RADIOACTIVE SEED IMPLANT/BRACHYTHERAPY IMPLANT;  Surgeon: Hollice Espy, MD;  Location: [facility];  Service: Urology;  Laterality: N/A;  . TONSILLECTOMY      SOCIAL HISTORY:   Social History   Tobacco Use  . Smoking status: Current Every Day Smoker    Packs/day: 1.50  . Smokeless tobacco: Never Used  Substance Use Topics  . Alcohol use: Yes    Alcohol/week: 8.0 standard drinks  Types: 8 Glasses of wine per week    Comment: occ    FAMILY HISTORY:   Family History  Problem Relation Age of Onset  . Heart attack Mother   . Heart disease Mother   . Heart attack Father   . Kidney disease Neg Hx   . Prostate cancer Neg Hx   . Kidney cancer Neg Hx   . Bladder Cancer Neg Hx     DRUG ALLERGIES:  No Known Allergies  REVIEW OF SYSTEMS:   ROS As per history of present illness. All pertinent systems were reviewed above. Constitutional, HEENT, cardiovascular, respiratory, GI, GU,  musculoskeletal, neuro, psychiatric, endocrine, integumentary and hematologic systems were reviewed and are otherwise negative/unremarkable except for positive findings mentioned above in the HPI.   MEDICATIONS AT HOME:   Prior to Admission medications   Medication Sig Start Date End Date Taking? Authorizing Provider  albuterol (PROVENTIL) (2.5 MG/3ML) 0.083% nebulizer solution Take 3 mLs by nebulization in the morning, at noon, in the evening, and at bedtime. 09/21/20  Yes [provider]  albuterol (VENTOLIN HFA) 108 (90 Base) MCG/ACT inhaler Inhale 2 puffs into the lungs every 4 (four) hours as needed for wheezing or shortness of breath.   Yes [provider]  aspirin EC 81 MG tablet Take 81 mg by mouth daily.   Yes [provider]  cholecalciferol (VITAMIN D3) 25 MCG (1000 UNIT) tablet Take 1,000 Units by mouth daily.   Yes [provider]  lisinopril-hydrochlorothiazide (ZESTORETIC) 20-12.5 MG tablet Take 2 tablets by mouth daily.   Yes [provider]  LORazepam (ATIVAN) 0.5 MG tablet Take 0.5 mg by mouth 2 (two) times daily as needed for anxiety. 10/06/20  Yes [provider]  rosuvastatin (CRESTOR) 10 MG tablet Take 10 mg by mouth daily.   Yes [provider]  tiotropium (SPIRIVA) 18 MCG inhalation capsule Place 18 mcg into inhaler and inhale daily.   Yes [provider]      VITAL SIGNS:  Blood pressure 120/77, pulse (!) 109, temperature 99.6 F (37.6 C), temperature source Axillary, resp. rate (!) 25, height 6' (1.829 m), weight 124.1 kg, SpO2 90 %.  PHYSICAL EXAMINATION:  Physical Exam  GENERAL:  70 y.o.-year-old patient lying in the bed with mild respiratory distress with conversational dyspnea, on BiPAP. EYES: Pupils equal, round, reactive to light and accommodation. No scleral icterus. Extraocular muscles intact.  HEENT: Head atraumatic, normocephalic. Oropharynx and nasopharynx clear.  NECK:  Supple, no  jugular venous distention. No thyroid enlargement, no tenderness.  LUNGS: Diminished expiratory airflow and bibasal breath sounds with scattered expiratory wheezes and harsh vesicular breathing. CARDIOVASCULAR: Regular rate and rhythm, S1, S2 normal. No murmurs, rubs, or gallops.  ABDOMEN: Soft, nondistended, nontender. Bowel sounds present. No organomegaly or mass.  EXTREMITIES: No pedal edema, cyanosis, or clubbing.  NEUROLOGIC: Cranial nerves II through XII are intact. Muscle strength 5/5 in all extremities. Sensation intact. Gait not checked.  PSYCHIATRIC: The patient is alert and oriented x 3.  Normal affect and good eye contact. SKIN: No obvious rash, lesion, or ulcer.   LABORATORY PANEL:   CBC Recent Labs  Lab 10/30/20 1446  WBC 9.7  HGB 12.6*  HCT 42.2  PLT 190   ------------------------------------------------------------------------------------------------------------------  Chemistries  Recent Labs  Lab 10/30/20 1446  NA 139  K 4.9  CL 92*  CO2 37*  GLUCOSE 149*  BUN 24*  CREATININE 1.01  CALCIUM 9.0  AST 17  ALT 14  ALKPHOS 49  BILITOT 0.7   ------------------------------------------------------------------------------------------------------------------  Cardiac Enzymes No results for input(s): TROPONINI in the last 168 hours. ------------------------------------------------------------------------------------------------------------------  RADIOLOGY:  DG Chest Port 1 View  Result Date: 10/30/2020 CLINICAL DATA:  Shortness of breath EXAM: PORTABLE CHEST 1 VIEW COMPARISON:  10/21/2017 FINDINGS: No consolidation or effusion. Emphysematous disease with bronchitic changes at the bases. Borderline cardiomegaly with aortic atherosclerosis. No pneumothorax. IMPRESSION: No active disease. Emphysematous disease with bronchitic changes at the bases. Electronically Signed   By: Donavan Foil M.D.   On: 10/30/2020 15:30      IMPRESSION AND PLAN:  Active  Problems:   Atrial fibrillation with rapid ventricular response ([place])  1.  COPD exacerbation with subsequent acute on chronic respiratory failure requiring BiPAP.. -The patient will be admitted to a progressive unit bed. -We will continue bronchodilator therapy with DuoNebs. -We will place on antibiotic therapy with IV Rocephin. -Mucolytic therapy will be provided. -Sputum culture culture will be obtained. -We will continue BiPAP as needed.  2.  Atrial fibrillation with rapid ventricular response. -We will continue him on IV Cardizem drip. -Cardiology consult and 2D echo be obtained. -Notify Dr. Ubaldo Glassing about the patient.  3.  Essential hypertension. -We will continue lisinopril HCT.  4.  Dyslipidemia. -We will continue Crestor.  5.  Coronary artery disease. -We will continue his aspirin as well as statin therapy and ACE inhibitor therapy.  DVT prophylaxis: Lovenox. Code Status: full code. Family Communication:  The plan of care was discussed in details with the patient (and family). I answered all questions. The patient agreed to proceed with the above mentioned plan. Further management will depend upon hospital course. Disposition Plan: Back to previous home environment Consults called: Cardiology consult to Dr. Satira Mccallum. All the records are reviewed and case discussed with ED provider.  Status is: Inpatient  Remains inpatient appropriate because:Hemodynamically unstable, Ongoing diagnostic testing needed not appropriate for outpatient work up, Unsafe d/c plan, IV treatments appropriate due to intensity of illness or inability to take PO and Inpatient level of care appropriate due to severity of illness   Dispo: The patient is from: Home              Anticipated d/c is to: Home              Anticipated d/c date is: 2 days              Patient currently is not medically stable to d/c.   Difficult to place patient No   TOTAL TIME TAKING CARE OF THIS PATIENT: 55 minutes.      Christel Mormon M.D on 10/30/2020 at 6:19 PM  [facility]   From 7 PM-7 AM, contact night-coverage [email or web address]  CC: Primary care physician; Pcp, No

## 2020-10-30 NOTE — ED Notes (Signed)
no Cardizem in [facility] pharmacy to send

## 2020-10-30 NOTE — ED Notes (Signed)
Hospital bed ordered.

## 2020-10-30 NOTE — ED Triage Notes (Signed)
Pt arrived via [facility] from home with c/o SOB. Per EMS, pt on 2L [place] chronically, home health noticed pt at 78% on 2L. Placed pt on 3L with no relief and called EMS.   Per EMS, pt placed on CPAP at 7 1/2 PEEP. Per EMS pt received 2 duonebs, 2mg  magnesium, and 125mg  of solumedrol   Pt at 97% on CPAP  EMS VS: 110/70, HR 90 12 lead reads Afib, 30-40 RR labored, CBG 178  Per EMS pt hx Afib, COPD, emphysema

## 2020-10-31 ENCOUNTER — Inpatient Hospital Stay
Admit: 2020-10-31 | Discharge: 2020-10-31 | Disposition: A | Discharge status: Home / Self Care | Payer: Medicare HMO | Attending: Family Medicine | Admitting: Family Medicine

## 2020-10-31 DIAGNOSIS — I959 Hypotension, unspecified: Secondary | ICD-10-CM

## 2020-10-31 LAB — ECHOCARDIOGRAM COMPLETE
AV Mean grad: 2 mmHg
AV Peak grad: 4 mmHg
Ao pk vel: 1 m/s
Area-P 1/2: 3.79 cm2
Height: 72 in
Single Plane A4C EF: 54.9 %
Weight: 4377.45 oz

## 2020-10-31 LAB — BASIC METABOLIC PANEL
Anion gap: 8 (ref 5–15)
BUN: 35 mg/dL — ABNORMAL HIGH (ref 8–23)
CO2: 35 mmol/L — ABNORMAL HIGH (ref 22–32)
Calcium: 8.5 mg/dL — ABNORMAL LOW (ref 8.9–10.3)
Chloride: 96 mmol/L — ABNORMAL LOW (ref 98–111)
Creatinine, Ser: 1.2 mg/dL (ref 0.61–1.24)
GFR, Estimated: >60 mL/min (ref >60)
Glucose, Bld: 149 mg/dL — ABNORMAL HIGH (ref 70–99)
Potassium: 5.2 mmol/L — ABNORMAL HIGH (ref 3.5–5.1)
Sodium: 139 mmol/L (ref 135–145)

## 2020-10-31 LAB — CBC
HCT: 35.1 % — ABNORMAL LOW (ref 39.0–52.0)
Hemoglobin: 10.7 g/dL — ABNORMAL LOW (ref 13.0–17.0)
MCH: 29.2 pg (ref 26.0–34.0)
MCHC: 30.5 g/dL (ref 30.0–36.0)
MCV: 95.9 fL (ref 80.0–100.0)
Platelets: 158 10*3/uL (ref 150–400)
RBC: 3.66 MIL/uL — ABNORMAL LOW (ref 4.22–5.81)
RDW: 13 % (ref 11.5–15.5)
WBC: 4.7 10*3/uL (ref 4.0–10.5)
nRBC: 0 % (ref 0.0–0.2)

## 2020-10-31 MED ORDER — DILTIAZEM HCL ER COATED BEADS 120 MG PO CP24: 240.0000 mg | ORAL_CAPSULE | Freq: Every day | ORAL | Status: DC

## 2020-10-31 MED ORDER — SODIUM CHLORIDE 0.9 % IV BOLUS
1000.0000 mL | Freq: Once | INTRAVENOUS | Status: AC
  Administered 2020-10-31: 1000 mL via INTRAVENOUS

## 2020-10-31 MED ORDER — SALINE SPRAY 0.65 % NA SOLN
1.0000 | NASAL | Status: DC | PRN
  Administered 2020-11-01: 1 via NASAL
  Filled 2020-10-31: qty 44

## 2020-10-31 MED ORDER — SODIUM CHLORIDE 0.9 % IV BOLUS
500.0000 mL | Freq: Once | INTRAVENOUS | Status: AC
  Administered 2020-10-31: 500 mL via INTRAVENOUS

## 2020-10-31 MED ORDER — DILTIAZEM HCL ER COATED BEADS 120 MG PO CP24
120.0000 mg | ORAL_CAPSULE | Freq: Every day | ORAL | Status: DC
  Administered 2020-10-31: 120 mg via ORAL
  Filled 2020-10-31: qty 1

## 2020-10-31 MED ORDER — DILTIAZEM HCL ER COATED BEADS 120 MG PO CP24
120.0000 mg | ORAL_CAPSULE | Freq: Every day | ORAL | Status: DC
  Administered 2020-11-01 – 2020-11-02 (×2): 120 mg via ORAL
  Filled 2020-10-31 (×2): qty 1

## 2020-10-31 MED ORDER — IPRATROPIUM-ALBUTEROL 0.5-2.5 (3) MG/3ML IN SOLN
3.0000 mL | Freq: Four times a day (QID) | RESPIRATORY_TRACT | Status: DC
  Administered 2020-10-31 – 2020-11-02 (×8): 3 mL via RESPIRATORY_TRACT
  Filled 2020-10-31 (×10): qty 3

## 2020-10-31 MED ORDER — ORAL CARE MOUTH RINSE
15.0000 mL | Freq: Two times a day (BID) | OROMUCOSAL | Status: DC
  Administered 2020-11-01: 15 mL via OROMUCOSAL

## 2020-10-31 NOTE — ED Notes (Addendum)
MD Damita Dunnings advising continue to monitor BP and let provider know if systolic BP is less than 80 and/or MAP is less than 65.

## 2020-10-31 NOTE — ED Notes (Signed)
RN states she is ready for patient. Advised that handoff needs to be completed. Katie RN called floor to advise patient is in transport.

## 2020-10-31 NOTE — Progress Notes (Signed)
   10/31/20 2000  Assess: MEWS Score  Temp 98.2 F (36.8 C)  BP (!) 99/59  Pulse Rate (!) 134  ECG Heart Rate (!) 113  Resp (!) 28  SpO2 90 %  O2 Device Nasal Cannula  O2 Flow Rate (L/min) 3 L/min  Assess: MEWS Score  MEWS Temp 0  MEWS Systolic 1  MEWS Pulse 2  MEWS RR 2  MEWS LOC 0  MEWS Score 5  MEWS Score Color Red  Assess: if the MEWS score is Yellow or Red  Were vital signs taken at a resting state? Yes  Focused Assessment No change from prior assessment  Early Detection of Sepsis Score *See Row Information* Medium  MEWS guidelines implemented *See Row Information* No, previously red, continue vital signs every 4 hours

## 2020-10-31 NOTE — Progress Notes (Signed)
   10/31/20 1327  Assess: MEWS Score  Temp 98.4 F (36.9 C)  BP 92/62  Pulse Rate (!) 109  Resp (!) 28  SpO2 93 %  Assess: MEWS Score  MEWS Temp 0  MEWS Systolic 1  MEWS Pulse 1  MEWS RR 2  MEWS LOC 0  MEWS Score 4  MEWS Score Color Red  Assess: if the MEWS score is Yellow or Red  Were vital signs taken at a resting state? Yes  Focused Assessment No change from prior assessment  Early Detection of Sepsis Score *See Row Information* Medium  MEWS guidelines implemented *See Row Information* Yes  Treat  Pain Scale 0-10  Pain Score 0  Take Vital Signs  Increase Vital Sign Frequency  Red: Q 1hr X 4 then Q 4hr X 4, if remains red, continue Q 4hrs  Escalate  MEWS: Escalate Red: discuss with charge nurse/RN and provider, consider discussing with RRT  Notify: Charge Nurse/RN  Name of Charge Nurse/RN Notified Colletta Maryland Rn  Date Charge Nurse/RN Notified 10/31/20  Time Charge Nurse/RN Notified 1340  Notify: Provider  Provider Name/Title Dr. Manuella Ghazi  Date Provider Notified 10/31/20  Time Provider Notified 1400  Notification Type Page (secure chat)  Notification Reason Other (Comment) (red MEWs)  Provider response No new orders  Date of Provider Response 10/31/20  Time of Provider Response 1450  Notify: Rapid Response  Name of Rapid Response RN Notified Sarah  Date Rapid Response Notified 10/31/20  Time Rapid Response Notified 1400  Document  Patient Outcome Not stable and remains on department  Progress note created (see row info) Yes   Cardiology and Hospitalist notified

## 2020-10-31 NOTE — Plan of Care (Signed)

## 2020-10-31 NOTE — Progress Notes (Signed)
[place] [facility] [place] NAME: Klayton Monie    MR#:  [id number]  DATE OF BIRTH:  Jun 02, 1951  SUBJECTIVE:  CHIEF COMPLAINT:   Chief Complaint  Patient presents with  . Shortness of Breath  dyspneic at rest, just weaned him off BiPAP and feels some better on [place]. reports progressive dyspnea  REVIEW OF SYSTEMS:  Review of Systems  Constitutional: Positive for malaise/fatigue. Negative for diaphoresis, fever and weight loss.  HENT: Negative for ear discharge, ear pain, hearing loss, nosebleeds, sore throat and tinnitus.   Eyes: Negative for blurred vision and pain.  Respiratory: Positive for cough, shortness of breath and wheezing. Negative for hemoptysis.   Cardiovascular: Positive for palpitations. Negative for chest pain, orthopnea and leg swelling.  Gastrointestinal: Negative for abdominal pain, blood in stool, constipation, diarrhea, heartburn, nausea and vomiting.  Genitourinary: Negative for dysuria, frequency and urgency.  Musculoskeletal: Negative for back pain and myalgias.  Skin: Negative for itching and rash.  Neurological: Negative for dizziness, tingling, tremors, focal weakness, seizures, weakness and headaches.  Psychiatric/Behavioral: Negative for depression. The patient is not nervous/anxious.    DRUG ALLERGIES:  No Known Allergies VITALS:  Blood pressure 92/63, pulse 89, temperature 97.9 F (36.6 C), temperature source Oral, resp. rate (!) 28, height 6' (1.829 m), weight 124.1 kg, SpO2 95 %. PHYSICAL EXAMINATION:  Physical Exam Constitutional:      General: He is in acute distress.     Appearance: He is obese. He is ill-appearing and toxic-appearing.  HENT:     Head: Normocephalic and atraumatic.  Eyes:     Extraocular Movements: EOM normal.     Conjunctiva/sclera: Conjunctivae normal.     Pupils: Pupils are equal, round, and reactive to light.  Neck:     Thyroid: No thyromegaly.     Trachea: No tracheal deviation.  Cardiovascular:      Rate and Rhythm: Regular rhythm.     Heart sounds: Normal heart sounds.  Pulmonary:     Effort: Tachypnea, accessory muscle usage and respiratory distress present.     Breath sounds: Examination of the right-lower field reveals wheezing. Examination of the left-lower field reveals wheezing and rhonchi. Wheezing and rhonchi present.  Chest:     Chest wall: No tenderness.  Abdominal:     General: Bowel sounds are normal. There is no distension.     Palpations: Abdomen is soft.     Tenderness: There is no abdominal tenderness.  Musculoskeletal:        General: Normal range of motion.     Cervical back: Normal range of motion and neck supple.  Skin:    General: Skin is warm and dry.     Findings: No rash.  Neurological:     Mental Status: He is alert and oriented to person, place, and time.     Cranial Nerves: No cranial nerve deficit.    LABORATORY PANEL:  Male CBC Recent Labs  Lab 10/31/20 0519  WBC 4.7  HGB 10.7*  HCT 35.1*  PLT 158   ------------------------------------------------------------------------------------------------------------------ Chemistries  Recent Labs  Lab 10/30/20 1446 10/31/20 0519  NA 139 139  K 4.9 5.2*  CL 92* 96*  CO2 37* 35*  GLUCOSE 149* 149*  BUN 24* 35*  CREATININE 1.01 1.20  CALCIUM 9.0 8.5*  AST 17  --   ALT 14  --   ALKPHOS 49  --   BILITOT 0.7  --    RADIOLOGY:  ECHOCARDIOGRAM COMPLETE  Result Date: 10/31/2020  ECHOCARDIOGRAM REPORT   Patient Name:   RUBEN MAHLER Date of Exam: 10/31/2020 Medical Rec #:  [id number]       Height:       72.0 in Accession #:    [id number]      Weight:       273.6 lb Date of Birth:  October 03, 1950        BSA:          2.434 m Patient Age:    21 years        BP:           83/62 mmHg Patient Gender: M               HR:           116 bpm. Exam Location:  [facility] Procedure: 2D Echo, Limited Color Doppler and Cardiac Doppler Indications:     I48.91 Atrial fibrillation  History:         Patient has no prior  history of Echocardiogram examinations.                  COPD; Risk Factors:Former Smoker, Sleep Apnea, Dyslipidemia and                  Hypertension.  Sonographer:     Charmayne Sheer RDCS (AE) Referring Phys:  [id number] [place] Diagnosing Phys: Bartholome Bill MD  Sonographer Comments: Technically challenging study due to limited acoustic windows. Image acquisition challenging due to patient body habitus, Image acquisition challenging due to COPD and Image acquisition challenging due to respiratory motion. IMPRESSIONS  1. Left ventricular ejection fraction, by estimation, is 60 to 65%. The left ventricle has normal function. The left ventricle has no regional wall motion abnormalities. Left ventricular diastolic parameters were normal.  2. Right ventricular systolic function is normal. The right ventricular size is mildly enlarged.  3. Right atrial size was mildly dilated.  4. The mitral valve was not well visualized. No evidence of mitral valve regurgitation.  5. The aortic valve was not well visualized. Aortic valve regurgitation is not visualized. FINDINGS  Left Ventricle: Left ventricular ejection fraction, by estimation, is 60 to 65%. The left ventricle has normal function. The left ventricle has no regional wall motion abnormalities. The left ventricular internal cavity size was normal in size. There is  no left ventricular hypertrophy. Left ventricular diastolic parameters were normal. Right Ventricle: The right ventricular size is mildly enlarged. No increase in right ventricular wall thickness. Right ventricular systolic function is normal. Left Atrium: Left atrial size was normal in size. Right Atrium: Right atrial size was mildly dilated. Pericardium: There is no evidence of pericardial effusion. Mitral Valve: The mitral valve was not well visualized. No evidence of mitral valve regurgitation. MV peak gradient, 4.2 mmHg. The mean mitral valve gradient is 2.0 mmHg. Tricuspid Valve: The tricuspid valve is not  well visualized. Tricuspid valve regurgitation is trivial. Aortic Valve: The aortic valve was not well visualized. Aortic valve regurgitation is not visualized. Aortic valve mean gradient measures 2.0 mmHg. Aortic valve peak gradient measures 4.0 mmHg. Pulmonic Valve: The pulmonic valve was not well visualized. Pulmonic valve regurgitation is not visualized. Aorta: The aortic root was not well visualized. IAS/Shunts: The interatrial septum was not well visualized.   LV Volumes (MOD) LV vol d, MOD A4C: 71.9 ml Diastology LV vol s, MOD A4C: 32.4 ml LV e' medial:    13.20 cm/s LV SV MOD A4C:     71.9  ml LV E/e' medial:  8.1                            LV e' lateral:   11.00 cm/s                            LV E/e' lateral: 9.7  RIGHT VENTRICLE RV Basal diam:  4.50 cm RV Mid diam:    4.80 cm LEFT ATRIUM           Index LA Vol (A4C): 80.2 ml 32.95 ml/m  AORTIC VALVE AV Vmax:           99.70 cm/s AV Vmean:          70.600 cm/s AV VTI:            0.171 m AV Peak Grad:      4.0 mmHg AV Mean Grad:      2.0 mmHg LVOT Vmax:         80.30 cm/s LVOT Vmean:        47.500 cm/s LVOT VTI:          0.118 m LVOT/AV VTI ratio: 0.69 MITRAL VALVE MV Area (PHT): 3.79 cm     SHUNTS MV Peak grad:  4.2 mmHg     Systemic VTI: 0.12 m MV Mean grad:  2.0 mmHg MV Vmax:       1.02 m/s MV Vmean:      52.8 cm/s MV Decel Time: 200 msec MV E velocity: 106.67 cm/s Bartholome Bill MD Electronically signed by Bartholome Bill MD Signature Date/Time: 10/31/2020/4:06:09 PM    Final    ASSESSMENT AND PLAN:  68 y m with severe emphysema f/by dr Raul Del [facility]) admitted for Acute on chronic hypoxic resp failure  1.  COPD exacerbation with subsequent acute on chronic respiratory failure requiring BiPAP while in ED -weaned off to [facility]. O2 - continue bronchodilator therapy with DuoNebs & empiric IV Rocephin. -c/s Pulmo - Dr Raul Del aware and will see him tomorrow - on prednisone 40 mg po daily  2.  Atrial fibrillation with rapid ventricular  response. -Initially on IV Cardizem drip and now transitioned off Cardizem CD 120 mg daily - echo shows normal LV function.  3.  Essential hypertension. -holding BP meds as hypotensive  4.  Dyslipidemia. - continue Crestor.  5.  Coronary artery disease. - continue his aspirin as well as statin therapy    Body mass index is 37.11 kg/m.  Net IO Since Admission: -120 mL [10/31/20 1901]     Status is: Inpatient  Remains inpatient appropriate because:Inpatient level of care appropriate due to severity of illness   Dispo: The patient is from: Home              Anticipated d/c is to: Home              Anticipated d/c date is: 2 days              Patient currently is not medically stable to d/c.   Difficult to place patient No    DVT prophylaxis:        apixaban (ELIQUIS) tablet 5 mg     Family Communication: "discussed with patient"   All the records are reviewed and case discussed with Care Management/Social Worker. Management plans discussed with the patient, nursing and they are in agreement.  CODE STATUS: DNR Level of care:  Progressive Cardiac  TOTAL TIME TAKING CARE OF THIS PATIENT: 35 minutes.   More than 50% of the time was spent in counseling/coordination of care: YES  POSSIBLE D/C IN 2-3 DAYS, DEPENDING ON CLINICAL CONDITION.   Max Sane M.D on 10/31/2020 at 7:01 PM  [facility]   CC: Primary care physician; Pcp, No  Note: This dictation was prepared with Dragon dictation along with smaller phrase technology. Any transcriptional errors that result from this process are unintentional.

## 2020-10-31 NOTE — Progress Notes (Signed)
Checked on patient on 2A as rapid response nurse by request of 2A staff due to red MEWS. Patient was alert and oriented. Stated his only complaint was his breathing that he said had "been bad for years". His heart rate was fluctuating between mid 90s to 110s. Oxygen saturations were reading in mid 90s when patient not messing with oxygen sensor on his finger (patient was attempting to clean his nails when I first walked in). BP did have systolic in 87G. Patient stated that was because he got "too much medicine" while in the emergency department.  Spoke with patient's nurse who had reached out to medical team and cardiology about patient's BP. Plan for gentle hydration per cardiology. Instructed 2A staff to re call if needed.

## 2020-10-31 NOTE — ED Notes (Signed)
Patient tolerating being off Bipap well. Patient is currently on 3.5L O2. Able to hold conversation without difficulty. Patient stats he normally runs at 89-90% on 2-3L O2 at home at all times.

## 2020-10-31 NOTE — ED Notes (Signed)
MD Damita Dunnings and MD [facility] paged to make aware that pt's BP not responding to fluid bolus or stopping cardizem infusion.

## 2020-10-31 NOTE — Progress Notes (Signed)
*  PRELIMINARY RESULTS* Echocardiogram 2D Echocardiogram has been performed.  Robert Powell 10/31/2020, 2:45 PM

## 2020-10-31 NOTE — Progress Notes (Signed)
[facility] Cardiology    SUBJECTIVE: Robert Powell is a 70 year old male with a past medical history significant for atrial fibrillation, anticoagulated with Eliquis, coronary artery disease s/p PCI to the LAD in 2017, carotid artery disease, oxygen-dependent emphysema, tobacco abuse, ethanol abuse, and hypertension who presented to the ED on 10/30/20 for a 5 day history of progressive dyspnea with associated wheezing and a productive cough.  He was noted to be in atrial fibrillation with RVR and was started on diltiazem drip.    10/31/20:  Mr. Lawhorn continues to experience shortness of breath which has slightly improved.  He denies chest pain, chest pressure, heart racing, or palpitations.  He denies lower extremity swelling, orthopnea, or PND.    Vitals:   10/31/20 0545 10/31/20 0600 10/31/20 0615 10/31/20 0630  BP: 91/65 100/67 91/73 (!) 88/68  Pulse: 82 85 85 89  Resp: (!) 21  (!) 25 15  Temp:      TempSrc:      SpO2: 98% 99% 99% 99%  Weight:      Height:         Intake/Output Summary (Last 24 hours) at 10/31/2020 0745 Last data filed at 10/30/2020 1810 Gross per 24 hour  Intake 100 ml  Output -  Net 100 ml      PHYSICAL EXAM  General: Morbidly obese, on BiPAP  HEENT:  Normocephalic and atramatic Neck:  No JVD.  Lungs: On BiPAP. Clear bilaterally to auscultation and percussion. Heart: Irregularly irregular, rate controlled. Normal S1 and S2 without gallops or murmurs.  Abdomen: Bowel sounds are positive, abdomen soft and non-tender  Msk:  Back normal.  Normal strength and tone for age. Extremities: No clubbing, cyanosis or edema.   Neuro: Alert and oriented X 3. Psych:  Good affect, responds appropriately   LABS: Basic Metabolic Panel: Recent Labs    10/30/20 1446 10/31/20 0519  NA 139 139  K 4.9 5.2*  CL 92* 96*  CO2 37* 35*  GLUCOSE 149* 149*  BUN 24* 35*  CREATININE 1.01 1.20  CALCIUM 9.0 8.5*   Liver Function Tests: Recent Labs    10/30/20 1446  AST 17  ALT  14  ALKPHOS 49  BILITOT 0.7  PROT 7.3  ALBUMIN 3.7   No results for input(s): LIPASE, AMYLASE in the last 72 hours. CBC: Recent Labs    10/30/20 1446 10/31/20 0519  WBC 9.7 4.7  NEUTROABS 8.0*  --   HGB 12.6* 10.7*  HCT 42.2 35.1*  MCV 96.1 95.9  PLT 190 158   Cardiac Enzymes: No results for input(s): CKTOTAL, CKMB, CKMBINDEX, TROPONINI in the last 72 hours. BNP: Invalid input(s): POCBNP D-Dimer: No results for input(s): DDIMER in the last 72 hours. Hemoglobin A1C: No results for input(s): HGBA1C in the last 72 hours. Fasting Lipid Panel: No results for input(s): CHOL, HDL, LDLCALC, TRIG, CHOLHDL, LDLDIRECT in the last 72 hours. Thyroid Function Tests: No results for input(s): TSH, T4TOTAL, T3FREE, THYROIDAB in the last 72 hours.  Invalid input(s): FREET3 Anemia Panel: No results for input(s): VITAMINB12, FOLATE, FERRITIN, TIBC, IRON, RETICCTPCT in the last 72 hours.  DG Chest Port 1 View  Result Date: 10/30/2020 CLINICAL DATA:  Shortness of breath EXAM: PORTABLE CHEST 1 VIEW COMPARISON:  10/21/2017 FINDINGS: No consolidation or effusion. Emphysematous disease with bronchitic changes at the bases. Borderline cardiomegaly with aortic atherosclerosis. No pneumothorax. IMPRESSION: No active disease. Emphysematous disease with bronchitic changes at the bases. Electronically Signed   By: Madie Reno.D.  On: 10/30/2020 15:30     Echo: Pending   TELEMETRY: Atrial fibrillation, rate in the 80s   ASSESSMENT AND PLAN:  Active Problems:   Atrial fibrillation with rapid ventricular response ([place])    1.  Atrial fibrillation with RVR   -Rate has improved on diltiazem drip, currently in the 80s   -Will transition to oral and closely monitor BP   2.  Hypertension   -Currently hypotensive, rate 88/68  -Would recommend holding lisinopril, HCTZ, and Imdur at this time; resume when appropriate   -Will stop diltiazem drip and transition to diltiazem 120mg  daily if BP allows    The history, physical exam findings, and plan of care were all discussed with Dr. Bartholome Bill, and all decision making was made in collaboration.   Avie Arenas  PA-C 10/31/2020 7:45 AM

## 2020-11-01 DIAGNOSIS — J9621 Acute and chronic respiratory failure with hypoxia: Secondary | ICD-10-CM

## 2020-11-01 LAB — BASIC METABOLIC PANEL WITH GFR
Anion gap: 7 (ref 5–15)
BUN: 42 mg/dL — ABNORMAL HIGH (ref 8–23)
CO2: 35 mmol/L — ABNORMAL HIGH (ref 22–32)
Calcium: 8.8 mg/dL — ABNORMAL LOW (ref 8.9–10.3)
Chloride: 95 mmol/L — ABNORMAL LOW (ref 98–111)
Creatinine, Ser: 1.03 mg/dL (ref 0.61–1.24)
GFR, Estimated: >60 mL/min
Glucose, Bld: 154 mg/dL — ABNORMAL HIGH (ref 70–99)
Potassium: 4.5 mmol/L (ref 3.5–5.1)
Sodium: 137 mmol/L (ref 135–145)

## 2020-11-01 LAB — CBC
HCT: 35.1 % — ABNORMAL LOW (ref 39.0–52.0)
Hemoglobin: 11 g/dL — ABNORMAL LOW (ref 13.0–17.0)
MCH: 28.9 pg (ref 26.0–34.0)
MCHC: 31.3 g/dL (ref 30.0–36.0)
MCV: 92.4 fL (ref 80.0–100.0)
Platelets: 157 K/uL (ref 150–400)
RBC: 3.8 MIL/uL — ABNORMAL LOW (ref 4.22–5.81)
RDW: 12.7 % (ref 11.5–15.5)
WBC: 11.9 K/uL — ABNORMAL HIGH (ref 4.0–10.5)
nRBC: 0 % (ref 0.0–0.2)

## 2020-11-01 LAB — PROCALCITONIN: Procalcitonin: <0.1 ng/mL

## 2020-11-01 MED ORDER — AZITHROMYCIN 250 MG PO TABS
500.0000 mg | ORAL_TABLET | Freq: Every day | ORAL | Status: AC
  Administered 2020-11-01: 500 mg via ORAL
  Filled 2020-11-01: qty 2

## 2020-11-01 MED ORDER — ALBUTEROL SULFATE HFA 108 (90 BASE) MCG/ACT IN AERS
2.0000 | INHALATION_SPRAY | RESPIRATORY_TRACT | Status: DC | PRN
  Administered 2020-11-01: 2 via RESPIRATORY_TRACT
  Filled 2020-11-01 (×2): qty 6.7

## 2020-11-01 MED ORDER — AZITHROMYCIN 250 MG PO TABS
250.0000 mg | ORAL_TABLET | Freq: Every day | ORAL | Status: DC
  Administered 2020-11-02: 250 mg via ORAL
  Filled 2020-11-01: qty 1

## 2020-11-01 NOTE — Progress Notes (Signed)
PROGRESS NOTE    Robert Powell  FIE:[id number] DOB: [birth year] DOA: 10/30/2020 PCP: Pcp, No   Brief Narrative: Taken from prior notes. Robert Powell is a 70 y.o. male with medical history significant for  COPD, chronic respiratory failure on 3 L of oxygen at baseline, OSA not on CPAP, adenocarcinoma of prostate, alcohol abuse, essential hypertension, narcolepsy presented to ED with worsening dyspnea, wheezing and productive cough.  Admitted for COPD exacerbation. On presentation he was hypoxic and initially requiring BiPAP, later transitioned to home oxygen. Also found to have A. fib with RVR, initially managed with Cardizem infusion which was discontinued due to softer blood pressure and he was transitioned to p.o. Cardizem.  Subjective: Patient was feeling little better when seen during morning rounds.  Heart rate remained in low 100s, worsening tachycardia and tachypnea with minor exertion.  Saturating well while resting on his baseline oxygen requirement of 3 L, desaturate with minor exertion.  Assessment & Plan:   Active Problems:   Atrial fibrillation with rapid ventricular response (HCC)  Acute on chronic hypoxic respiratory failure secondary to COPD exacerbation. Pulmonology was consulted. Chest x-ray without any acute infiltrate, severe emphysema and chronic bronchitis changes. He was also started on ceftriaxone empirically. -Check procalcitonin-if negative we will discontinue ceftriaxone. -Start him on Z-Pak for it's anti-inflammatory effect on lungs. -Continue with supplemental oxygen to keep saturation above 88%. -Continue with steroid. -Continue with DuoNeb.  Atrial fibrillation with RVR.  Initially was on IV Cardizem infusion which was discontinued due to hypotension.  Now transitioned off to Cardizem 120 mg daily.  Echocardiogram without any significant abnormality and normal EF. Cardiology was consulted-appreciate their recommendations, they will try increasing the  dose of Cardizem for better heart rate control tomorrow if blood pressure allows. -Continue Cardizem and Eliquis.  Essential hypertension.  Currently blood pressure within lower normal limit. -Holding home antihypertensives. -Patient will need increased dose of Cardizem for better control of his heart rate.  Dyslipidemia. -Continue Crestor  CAD.  No chest pain. -Continue home dose of aspirin and statin  Stage II obesity. Body mass index is 36.93 kg/m.  Objective: Vitals:   11/01/20 0400 11/01/20 1200 11/01/20 1227 11/01/20 1345  BP: 115/79 109/70    Pulse: 92 (!) 106 (!) 108 91  Resp: 18 (!) 29 20 20   Temp: (!) 97.4 F (36.3 C) 97.6 F (36.4 C)    TempSrc: Oral Oral    SpO2: 92% 96% 93% 94%  Weight: 123.5 kg     Height:        Intake/Output Summary (Last 24 hours) at 11/01/2020 1604 Last data filed at 11/01/2020 1603 Gross per 24 hour  Intake 4960.61 ml  Output 1950 ml  Net 3010.61 ml   Filed Weights   10/30/20 1445 11/01/20 0400  Weight: 124.1 kg 123.5 kg    Examination:  General exam: Appears calm and comfortable  Respiratory system: Decreased breath sound at bases, no wheezing, respiratory effort normal. Cardiovascular system: Irregularly irregular with tachycardia Gastrointestinal system: Soft, nontender, nondistended, bowel sounds positive. Central nervous system: Alert and oriented. No focal neurological deficits. Extremities: No edema, no cyanosis, pulses intact and symmetrical. Psychiatry: Judgement and insight appear normal. Mood & affect appropriate.    DVT prophylaxis: Eliquis Code Status: DNR Family Communication: Discussed with patient Disposition Plan:  Status is: Inpatient  Remains inpatient appropriate because:Inpatient level of care appropriate due to severity of illness   Dispo: The patient is from: Home  Anticipated d/c is to: Home              Anticipated d/c date is: 1 day              Patient currently is not medically  stable to d/c.   Difficult to place patient No              Level of care: Progressive Cardiac  All the records are reviewed and case discussed with Care Management/Social Worker. Management plans discussed with the patient, nursing and they are in agreement.  Consultants:   Cardiology  Pulmonology  Procedures:  Antimicrobials:  Zithromax Ceftriaxone  Data Reviewed: I have personally reviewed following labs and imaging studies  CBC: Recent Labs  Lab 10/30/20 1446 10/31/20 0519 11/01/20 0517  WBC 9.7 4.7 11.9*  NEUTROABS 8.0*  --   --   HGB 12.6* 10.7* 11.0*  HCT 42.2 35.1* 35.1*  MCV 96.1 95.9 92.4  PLT 190 158 623   Basic Metabolic Panel: Recent Labs  Lab 10/30/20 1446 10/31/20 0519 11/01/20 0517  NA 139 139 137  K 4.9 5.2* 4.5  CL 92* 96* 95*  CO2 37* 35* 35*  GLUCOSE 149* 149* 154*  BUN 24* 35* 42*  CREATININE 1.01 1.20 1.03  CALCIUM 9.0 8.5* 8.8*   GFR: Estimated Creatinine Clearance: 89.3 mL/min (by C-G formula based on SCr of 1.03 mg/dL). Liver Function Tests: Recent Labs  Lab 10/30/20 1446  AST 17  ALT 14  ALKPHOS 49  BILITOT 0.7  PROT 7.3  ALBUMIN 3.7   No results for input(s): LIPASE, AMYLASE in the last 168 hours. No results for input(s): AMMONIA in the last 168 hours. Coagulation Profile: No results for input(s): INR, PROTIME in the last 168 hours. Cardiac Enzymes: No results for input(s): CKTOTAL, CKMB, CKMBINDEX, TROPONINI in the last 168 hours. BNP (last 3 results) No results for input(s): PROBNP in the last 8760 hours. HbA1C: No results for input(s): HGBA1C in the last 72 hours. CBG: No results for input(s): GLUCAP in the last 168 hours. Lipid Profile: No results for input(s): CHOL, HDL, LDLCALC, TRIG, CHOLHDL, LDLDIRECT in the last 72 hours. Thyroid Function Tests: No results for input(s): TSH, T4TOTAL, FREET4, T3FREE, THYROIDAB in the last 72 hours. Anemia Panel: No results for input(s): VITAMINB12, FOLATE, FERRITIN, TIBC,  IRON, RETICCTPCT in the last 72 hours. Sepsis Labs: No results for input(s): PROCALCITON, LATICACIDVEN in the last 168 hours.  Recent Results (from the past 240 hour(s))  Resp Panel by RT-PCR (Flu A&B, Covid) Nasopharyngeal Swab     Status: None   Collection Time: 10/30/20  3:00 PM   Specimen: Nasopharyngeal Swab; Nasopharyngeal(NP) swabs in vial transport medium  Result Value Ref Range Status   SARS Coronavirus 2 by RT PCR NEGATIVE NEGATIVE Final    Comment: (NOTE) SARS-CoV-2 target nucleic acids are NOT DETECTED.  The SARS-CoV-2 RNA is generally detectable in upper respiratory specimens during the acute phase of infection. The lowest concentration of SARS-CoV-2 viral copies this assay can detect is 138 copies/mL. A negative result does not preclude SARS-Cov-2 infection and should not be used as the sole basis for treatment or other patient management decisions. A negative result may occur with  improper specimen collection/handling, submission of specimen other than nasopharyngeal swab, presence of viral mutation(s) within the areas targeted by this assay, and inadequate number of viral copies(<138 copies/mL). A negative result must be combined with clinical observations, patient history, and epidemiological information. The expected result is Negative.  Fact Sheet for Patients:  EntrepreneurPulse.com.au  Fact Sheet for Healthcare Providers:  IncredibleEmployment.be  This test is no t yet approved or cleared by the [place] FDA and  has been authorized for detection and/or diagnosis of SARS-CoV-2 by FDA under an Emergency Use Authorization (EUA). This EUA will remain  in effect (meaning this test can be used) for the duration of the COVID-19 declaration under Section 564(b)(1) of the Act, 21 U.S.C.section 360bbb-3(b)(1), unless the authorization is terminated  or revoked sooner.       Influenza A by PCR NEGATIVE NEGATIVE Final    Influenza B by PCR NEGATIVE NEGATIVE Final    Comment: (NOTE) The Xpert Xpress SARS-CoV-2/FLU/RSV plus assay is intended as an aid in the diagnosis of influenza from Nasopharyngeal swab specimens and should not be used as a sole basis for treatment. Nasal washings and aspirates are unacceptable for Xpert Xpress SARS-CoV-2/FLU/RSV testing.  Fact Sheet for Patients: EntrepreneurPulse.com.au  Fact Sheet for Healthcare Providers: IncredibleEmployment.be  This test is not yet approved or cleared by the [place] FDA and has been authorized for detection and/or diagnosis of SARS-CoV-2 by FDA under an Emergency Use Authorization (EUA). This EUA will remain in effect (meaning this test can be used) for the duration of the COVID-19 declaration under Section 564(b)(1) of the Act, 21 U.S.C. section 360bbb-3(b)(1), unless the authorization is terminated or revoked.  Performed at [facility], [address], [place],  [address]      Radiology Studies: ECHOCARDIOGRAM COMPLETE  Result Date: 10/31/2020    ECHOCARDIOGRAM REPORT   Patient Name:   Robert Powell Date of Exam: 10/31/2020 Medical Rec #:  [id number]       Height:       72.0 in Accession #:    [id number]      Weight:       273.6 lb Date of Birth:  [birth year]        BSA:          2.434 m Patient Age:    20 years        BP:           83/62 mmHg Patient Gender: M               HR:           116 bpm. Exam Location:  [facility] Procedure: 2D Echo, Limited Color Doppler and Cardiac Doppler Indications:     I48.91 Atrial fibrillation  History:         Patient has no prior history of Echocardiogram examinations.                  COPD; Risk Factors:Former Smoker, Sleep Apnea, Dyslipidemia and                  Hypertension.  Sonographer:     Charmayne Sheer RDCS (AE) Referring Phys:  [id number] [place] Diagnosing Phys: Bartholome Bill MD  Sonographer Comments: Technically challenging study due to limited  acoustic windows. Image acquisition challenging due to patient body habitus, Image acquisition challenging due to COPD and Image acquisition challenging due to respiratory motion. IMPRESSIONS  1. Left ventricular ejection fraction, by estimation, is 60 to 65%. The left ventricle has normal function. The left ventricle has no regional wall motion abnormalities. Left ventricular diastolic parameters were normal.  2. Right ventricular systolic function is normal. The right ventricular size is mildly enlarged.  3. Right atrial size was mildly dilated.  4. The  mitral valve was not well visualized. No evidence of mitral valve regurgitation.  5. The aortic valve was not well visualized. Aortic valve regurgitation is not visualized. FINDINGS  Left Ventricle: Left ventricular ejection fraction, by estimation, is 60 to 65%. The left ventricle has normal function. The left ventricle has no regional wall motion abnormalities. The left ventricular internal cavity size was normal in size. There is  no left ventricular hypertrophy. Left ventricular diastolic parameters were normal. Right Ventricle: The right ventricular size is mildly enlarged. No increase in right ventricular wall thickness. Right ventricular systolic function is normal. Left Atrium: Left atrial size was normal in size. Right Atrium: Right atrial size was mildly dilated. Pericardium: There is no evidence of pericardial effusion. Mitral Valve: The mitral valve was not well visualized. No evidence of mitral valve regurgitation. MV peak gradient, 4.2 mmHg. The mean mitral valve gradient is 2.0 mmHg. Tricuspid Valve: The tricuspid valve is not well visualized. Tricuspid valve regurgitation is trivial. Aortic Valve: The aortic valve was not well visualized. Aortic valve regurgitation is not visualized. Aortic valve mean gradient measures 2.0 mmHg. Aortic valve peak gradient measures 4.0 mmHg. Pulmonic Valve: The pulmonic valve was not well visualized. Pulmonic valve  regurgitation is not visualized. Aorta: The aortic root was not well visualized. IAS/Shunts: The interatrial septum was not well visualized.   LV Volumes (MOD) LV vol d, MOD A4C: 71.9 ml Diastology LV vol s, MOD A4C: 32.4 ml LV e' medial:    13.20 cm/s LV SV MOD A4C:     71.9 ml LV E/e' medial:  8.1                            LV e' lateral:   11.00 cm/s                            LV E/e' lateral: 9.7  RIGHT VENTRICLE RV Basal diam:  4.50 cm RV Mid diam:    4.80 cm LEFT ATRIUM           Index LA Vol (A4C): 80.2 ml 32.95 ml/m  AORTIC VALVE AV Vmax:           99.70 cm/s AV Vmean:          70.600 cm/s AV VTI:            0.171 m AV Peak Grad:      4.0 mmHg AV Mean Grad:      2.0 mmHg LVOT Vmax:         80.30 cm/s LVOT Vmean:        47.500 cm/s LVOT VTI:          0.118 m LVOT/AV VTI ratio: 0.69 MITRAL VALVE MV Area (PHT): 3.79 cm     SHUNTS MV Peak grad:  4.2 mmHg     Systemic VTI: 0.12 m MV Mean grad:  2.0 mmHg MV Vmax:       1.02 m/s MV Vmean:      52.8 cm/s MV Decel Time: 200 msec MV E velocity: 106.67 cm/s Bartholome Bill MD Electronically signed by Bartholome Bill MD Signature Date/Time: 10/31/2020/4:06:09 PM    Final     Scheduled Meds: . apixaban  5 mg Oral BID  . calcium-vitamin D  1 tablet Oral Daily  . clopidogrel  75 mg Oral Q breakfast  . diltiazem  120 mg Oral Daily  . docusate sodium  100 mg Oral BID  . ipratropium-albuterol  3 mL Nebulization Q6H  . mouth rinse  15 mL Mouth Rinse BID  . predniSONE  40 mg Oral Q breakfast  . rosuvastatin  10 mg Oral Daily   Continuous Infusions: . sodium chloride 100 mL/hr at 11/01/20 1603  . cefTRIAXone (ROCEPHIN)  IV Stopped (10/31/20 1912)     LOS: 2 days   Time spent: 40 minutes. More than 50% of the time was spent in counseling/coordination of care  Lorella Nimrod, MD [facility]  If 7PM-7AM, please contact night-coverage [email or web address]  11/01/2020, 4:04 PM   This record has been created using Systems analyst. Errors have  been sought and corrected,but may not always be located. Such creation errors do not reflect on the standard of care.

## 2020-11-01 NOTE — Evaluation (Signed)
Physical Therapy Evaluation Patient Details Name: Robert Powell MRN: [id number] DOB: Jul 27, 1951 Today's Date: 11/01/2020   History of Present Illness  presented to ER secondary to acute dyspnea, productive cough and reported palpitations, chest discomfort; admitted for management of afib with RVR and COPD exacerbation.  Clinical Impression  Upon evaluation, patient alert and oriented; follows commands and agreeable to participation with session as able.  Denies pain, but notably SOB with very minimal exertion.  Bilat UE/LE strength and ROM grossly symmetrical and WFL; no focal weakness appreciated.  Able to complete bed mobility with min assist; sit/stand, basic transfers and gait (5') without assist device, cga/min assist.  Demonstrates decreased step height/length, guarded gait performance; requires UE support throughout (but refuses trial of RW).  HR elevation to 130s, RR to mid-30s and SaO2 85% with simple bed/chair transfer; unable to tolerate additional activity as result.  MD informed/aware. Would benefit from skilled PT to address above deficits and promote optimal return to PLOF.; Recommend transition to HHPT upon discharge from acute hospitalization.  May benefit from EMS transport home; limited ability to tolerate/negotiate steps required for entry/exit of home.     Follow Up Recommendations Home health PT    Equipment Recommendations  Hospital bed    Recommendations for Other Services       Precautions / Restrictions Precautions Precautions: Fall Restrictions Weight Bearing Restrictions: No      Mobility  Bed Mobility Overal bed mobility: Needs Assistance Bed Mobility: Supine to Sit     Supine to sit: Min assist     General bed mobility comments: heavy use of bedrails to complete    Transfers Overall transfer level: Needs assistance   Transfers: Sit to/from Stand Sit to Stand: Min guard;Min assist         General transfer comment: requires UE support  for stabilization and overall safety  Ambulation/Gait Ambulation/Gait assistance: Min guard Gait Distance (Feet): 5 Feet Assistive device: None       General Gait Details: decreased step height/length, guarded gait performance; requires UE support throughout (but refuses trial of RW)  Stairs            Wheelchair Mobility    Modified Rankin (Stroke Patients Only)       Balance Overall balance assessment: Needs assistance Sitting-balance support: No upper extremity supported;Feet supported Sitting balance-Leahy Scale: Good     Standing balance support: Single extremity supported Standing balance-Leahy Scale: Fair                               Pertinent Vitals/Pain Pain Assessment: No/denies pain    Home Living Family/patient expects to be discharged to:: Private residence Living Arrangements: Spouse/significant other Available Help at Discharge: Family;Available PRN/intermittently Type of Home: House Home Access: Stairs to enter   Entrance Stairs-Number of Steps: 6 Home Layout: One level Home Equipment: Wheelchair - manual (lift chair)      Prior Function Level of Independence: Independent         Comments: Limited household ambulator without assist device (distance from bedroom to bathroom); minimal/no out of home activity, limited by respiratory capacity.  Home O2 at 3L.  Denies fall history.     Hand Dominance        Extremity/Trunk Assessment   Upper Extremity Assessment Upper Extremity Assessment: Overall WFL for tasks assessed (grossly 4/5 throughout)    Lower Extremity Assessment Lower Extremity Assessment: Overall WFL for tasks assessed (grossly 4/5 throughout)  Communication   Communication: No difficulties  Cognition Arousal/Alertness: Awake/alert Behavior During Therapy: WFL for tasks assessed/performed Overall Cognitive Status: Within Functional Limits for tasks assessed                                         General Comments      Exercises Other Exercises Other Exercises: Educated in role of PT and progresive mobility; reviewed pursed lip breathing and relaxation techniques; encouraged seated LE therex (ankle pumps, LAQs, marching) while seated in chair outside of therapy sessions for LE strength/endurance. Patient voiced understanding of all information.   Assessment/Plan    PT Assessment Patient needs continued PT services  PT Problem List Decreased activity tolerance;Decreased balance;Decreased mobility;Cardiopulmonary status limiting activity       PT Treatment Interventions DME instruction;Gait training;Functional mobility training;Therapeutic activities;Therapeutic exercise;Balance training;Patient/family education    PT Goals (Current goals can be found in the Care Plan section)  Acute Rehab PT Goals Patient Stated Goal: to return home PT Goal Formulation: With patient Time For Goal Achievement: 11/15/20 Potential to Achieve Goals: Fair    Frequency Min 2X/week   Barriers to discharge        Co-evaluation               AM-PAC PT "6 Clicks" Mobility  Outcome Measure Help needed turning from your back to your side while in a flat bed without using bedrails?: None Help needed moving from lying on your back to sitting on the side of a flat bed without using bedrails?: A Little Help needed moving to and from a bed to a chair (including a wheelchair)?: A Little Help needed standing up from a chair using your arms (e.g., wheelchair or bedside chair)?: A Little Help needed to walk in hospital room?: A Little Help needed climbing 3-5 steps with a railing? : A Lot 6 Click Score: 18    End of Session Equipment Utilized During Treatment: Gait belt;Oxygen Activity Tolerance: Patient tolerated treatment well Patient left: in chair;with call bell/phone within reach;with chair alarm set Nurse Communication: Mobility status PT Visit Diagnosis: Muscle weakness  (generalized) (M62.81);Difficulty in walking, not elsewhere classified (R26.2)    Time: 6433-2951 PT Time Calculation (min) (ACUTE ONLY): 34 min   Charges:   PT Evaluation $PT Eval Moderate Complexity: 1 Mod PT Treatments $Therapeutic Activity: 8-22 mins       Jaylissa Felty H. Owens Shark, PT, DPT, NCS 11/01/20, 3:22 PM [phone]

## 2020-11-01 NOTE — Progress Notes (Signed)
[facility]    SUBJECTIVE: Robert Powell is a 70 year old male with a past medical history significant for atrial fibrillation, anticoagulated with Eliquis, coronary artery disease s/p PCI to the LAD in 2017, carotid artery disease, oxygen-dependent emphysema, tobacco abuse, ethanol abuse, and hypertension who presented to the ED on 10/30/20 for a 5 day history of progressive dyspnea with associated wheezing and a productive cough.  He was noted to be in atrial fibrillation with RVR and was started on diltiazem drip.    10/31/20:  Robert Powell continues to experience shortness of breath which has slightly improved.  He denies chest pain, chest pressure, heart racing, or palpitations.  He denies lower extremity swelling, orthopnea, or PND.   11/01/20: Robert Powell is sitting up in bed, eating breakfast, in no acute distress.  He is off of BiPAP and on [place].  He continues to experience shortness of breath, but this has improved since yesterday.  He denies chest pain, chest pressure, palpitations, or heart racing.     Vitals:   10/31/20 2100 11/01/20 0000 11/01/20 0200 11/01/20 0400  BP: 98/66 101/74 103/73 115/79  Pulse: 65 97 93 92  Resp: 13 (!) 22 (!) 24 18  Temp:    (!) 97.4 F (36.3 C)  TempSrc:    Oral  SpO2: 92% 92% 92% 92%  Weight:    123.5 kg  Height:         Intake/Output Summary (Last 24 hours) at 11/01/2020 2376 Last data filed at 11/01/2020 0600 Gross per 24 hour  Intake 720 ml  Output 1350 ml  Net -630 ml      PHYSICAL EXAM  General: Well developed, obese, in no acute distress HEENT:  Normocephalic and atramatic Neck:  No JVD.  Lungs: Clear bilaterally to auscultation and percussion. Heart: Irregularly irregular, varying ventricular rate . Normal S1 and S2 without gallops or murmurs.  Abdomen: Bowel sounds are positive, abdomen soft and non-tender  Msk:  Back normal.  Normal strength and tone for age. Extremities: No clubbing, cyanosis or edema.   Neuro: Alert and oriented X  3. Psych:  Good affect, responds appropriately   LABS: Basic Metabolic Panel: Recent Labs    10/31/20 0519 11/01/20 0517  NA 139 137  K 5.2* 4.5  CL 96* 95*  CO2 35* 35*  GLUCOSE 149* 154*  BUN 35* 42*  CREATININE 1.20 1.03  CALCIUM 8.5* 8.8*   Liver Function Tests: Recent Labs    10/30/20 1446  AST 17  ALT 14  ALKPHOS 49  BILITOT 0.7  PROT 7.3  ALBUMIN 3.7   No results for input(s): LIPASE, AMYLASE in the last 72 hours. CBC: Recent Labs    10/30/20 1446 10/31/20 0519 11/01/20 0517  WBC 9.7 4.7 11.9*  NEUTROABS 8.0*  --   --   HGB 12.6* 10.7* 11.0*  HCT 42.2 35.1* 35.1*  MCV 96.1 95.9 92.4  PLT 190 158 157   Cardiac Enzymes: No results for input(s): CKTOTAL, CKMB, CKMBINDEX, TROPONINI in the last 72 hours. BNP: Invalid input(s): POCBNP D-Dimer: No results for input(s): DDIMER in the last 72 hours. Hemoglobin A1C: No results for input(s): HGBA1C in the last 72 hours. Fasting Lipid Panel: No results for input(s): CHOL, HDL, LDLCALC, TRIG, CHOLHDL, LDLDIRECT in the last 72 hours. Thyroid Function Tests: No results for input(s): TSH, T4TOTAL, T3FREE, THYROIDAB in the last 72 hours.  Invalid input(s): FREET3 Anemia Panel: No results for input(s): VITAMINB12, FOLATE, FERRITIN, TIBC, IRON, RETICCTPCT in the  last 72 hours.  DG Chest Port 1 View  Result Date: 10/30/2020 CLINICAL DATA:  Shortness of breath EXAM: PORTABLE CHEST 1 VIEW COMPARISON:  10/21/2017 FINDINGS: No consolidation or effusion. Emphysematous disease with bronchitic changes at the bases. Borderline cardiomegaly with aortic atherosclerosis. No pneumothorax. IMPRESSION: No active disease. Emphysematous disease with bronchitic changes at the bases. Electronically Signed   By: Donavan Foil M.D.   On: 10/30/2020 15:30   ECHOCARDIOGRAM COMPLETE  Result Date: 10/31/2020    ECHOCARDIOGRAM REPORT   Patient Name:   Robert Powell Date of Exam: 10/31/2020 Medical Rec #:  [id number]       Height:        72.0 in Accession #:    [id number]      Weight:       273.6 lb Date of Birth:  04-11-1951        BSA:          2.434 m Patient Age:    77 years        BP:           83/62 mmHg Patient Gender: M               HR:           116 bpm. Exam Location:  [facility] Procedure: 2D Echo, Limited Color Doppler and Cardiac Doppler Indications:     I48.91 Atrial fibrillation  History:         Patient has no prior history of Echocardiogram examinations.                  COPD; Risk Factors:Former Smoker, Sleep Apnea, Dyslipidemia and                  Hypertension.  Sonographer:     Charmayne Sheer RDCS (AE) Referring Phys:  [id number] [place] Diagnosing Phys: Bartholome Bill MD  Sonographer Comments: Technically challenging study due to limited acoustic windows. Image acquisition challenging due to patient body habitus, Image acquisition challenging due to COPD and Image acquisition challenging due to respiratory motion. IMPRESSIONS  1. Left ventricular ejection fraction, by estimation, is 60 to 65%. The left ventricle has normal function. The left ventricle has no regional wall motion abnormalities. Left ventricular diastolic parameters were normal.  2. Right ventricular systolic function is normal. The right ventricular size is mildly enlarged.  3. Right atrial size was mildly dilated.  4. The mitral valve was not well visualized. No evidence of mitral valve regurgitation.  5. The aortic valve was not well visualized. Aortic valve regurgitation is not visualized. FINDINGS  Left Ventricle: Left ventricular ejection fraction, by estimation, is 60 to 65%. The left ventricle has normal function. The left ventricle has no regional wall motion abnormalities. The left ventricular internal cavity size was normal in size. There is  no left ventricular hypertrophy. Left ventricular diastolic parameters were normal. Right Ventricle: The right ventricular size is mildly enlarged. No increase in right ventricular wall thickness. Right ventricular systolic  function is normal. Left Atrium: Left atrial size was normal in size. Right Atrium: Right atrial size was mildly dilated. Pericardium: There is no evidence of pericardial effusion. Mitral Valve: The mitral valve was not well visualized. No evidence of mitral valve regurgitation. MV peak gradient, 4.2 mmHg. The mean mitral valve gradient is 2.0 mmHg. Tricuspid Valve: The tricuspid valve is not well visualized. Tricuspid valve regurgitation is trivial. Aortic Valve: The aortic valve was not well visualized. Aortic valve regurgitation is  not visualized. Aortic valve mean gradient measures 2.0 mmHg. Aortic valve peak gradient measures 4.0 mmHg. Pulmonic Valve: The pulmonic valve was not well visualized. Pulmonic valve regurgitation is not visualized. Aorta: The aortic root was not well visualized. IAS/Shunts: The interatrial septum was not well visualized.   LV Volumes (MOD) LV vol d, MOD A4C: 71.9 ml Diastology LV vol s, MOD A4C: 32.4 ml LV e' medial:    13.20 cm/s LV SV MOD A4C:     71.9 ml LV E/e' medial:  8.1                            LV e' lateral:   11.00 cm/s                            LV E/e' lateral: 9.7  RIGHT VENTRICLE RV Basal diam:  4.50 cm RV Mid diam:    4.80 cm LEFT ATRIUM           Index LA Vol (A4C): 80.2 ml 32.95 ml/m  AORTIC VALVE AV Vmax:           99.70 cm/s AV Vmean:          70.600 cm/s AV VTI:            0.171 m AV Peak Grad:      4.0 mmHg AV Mean Grad:      2.0 mmHg LVOT Vmax:         80.30 cm/s LVOT Vmean:        47.500 cm/s LVOT VTI:          0.118 m LVOT/AV VTI ratio: 0.69 MITRAL VALVE MV Area (PHT): 3.79 cm     SHUNTS MV Peak grad:  4.2 mmHg     Systemic VTI: 0.12 m MV Mean grad:  2.0 mmHg MV Vmax:       1.02 m/s MV Vmean:      52.8 cm/s MV Decel Time: 200 msec MV E velocity: 106.67 cm/s Bartholome Bill MD Electronically signed by Bartholome Bill MD Signature Date/Time: 10/31/2020/4:06:09 PM    Final      Echo: Normal RV and LV systolic function with an EF estimated between 60-65% with no  evidence of regional wall abnormalities; no significant valvular abnormalities noted   TELEMETRY: Atrial fibrillation, rate fluctuating between 95-120bpm   ASSESSMENT AND PLAN:  Active Problems:   Atrial fibrillation with rapid ventricular response ([place])    1.  Atrial fibrillation with RVR              -Became hypotensive on diltiazem drip; transitioned to oral diltiazem 120mg  yesterday   -Rate fluctuating between 95-120; if BP allows, will increase diltiazem to 240mg  daily tomorrow   -Continue Eliquis 5mg  BID; stressed the importance of daily compliance and routine follow up appointments at [facility]   2.  Hypertension              -Became hypotensive on diltiazem drip - transitioned to oral              -Lisinopril, HCTZ, Imdur, and tamsulosin all held yesterday; will resume when appropriate              -Will continue diltiazem 120mg  daily for rate control and increase to 240mg  when pressure allows   3.  COPD   -Off of BiPAP, on [place]    The history, physical exam  findings, and plan of care were all discussed with Dr. Bartholome Bill, and all decision making was made in collaboration.    Avie Arenas  PA-C 11/01/2020 8:23 AM

## 2020-11-01 NOTE — Consult Note (Signed)
Pulmonary Medicine       Date: 11/01/2020,   MRN# [id number] OMEED OSUNA 26-Apr-1951     AdmissionWeight: 124.1 kg                 CurrentWeight: 123.5 kg    CHIEF COMPLAINT:   Copd exacerbation  HPI: This patient is well known to me. He has severe copd, ex smoker was treated with breo, soiriva and albuterol. Chronic dyspnea and minimum activity. He is very much over weight. He had sleep apnea and had upper air way surgery with improvement. Not on cpap but has hypersomnolence, ???  narcolepsy.      He came in here with diff breathing and hypoxia. Was initially on bipap. Also was in rapid afib, RVR, cardiology came in and saw. ( see cardiology notes).  IV DILTIAZEM WAS STARTED. No chest pain, no leg pain,on eliquis.     T TT  PAST MEDICAL HISTORY   Past Medical History:  Diagnosis Date  . AA (alcohol abuse) 02/28/2014  . Adenocarcinoma of prostate ([place]) 05/22/2015  . Alcohol abuse   . Apnea, sleep 02/28/2014  . Benign essential HTN 02/17/2015  . 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 Cancer ([place])   . Cellulitis 09/21/2014  . Chronic obstructive pulmonary disease ([place]) 04/28/2014  . 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 COPD (chronic obstructive pulmonary disease) ([place])   . Elevated prostate specific antigen (PSA) 02/16/2015  . Elevated PSA    6.7 on 02/16/2015  . Essential (primary) hypertension 02/18/2015  . Genital herpes   . Hyperlipidemia   . Hypertension   . Lesion of external ear 04/28/2014  . Narcolepsy   . Rhinitis   . Sleep apnea    histort had surgery better  . Tobacco abuse      SURGICAL HISTORY   Past Surgical History:  Procedure Laterality Date  . CARDIAC CATHETERIZATION Left 05/23/2016   Procedure: Left Heart Cath and Coronary Angiography;  Surgeon: Corey Skains, MD;  Location: [place] [facility];  Service: Cardiovascular;  Laterality: Left;  . CARDIAC CATHETERIZATION N/A 05/23/2016    Procedure: Coronary Stent Intervention;  Surgeon: Isaias Cowman, MD;  Location: [place] [facility];  Service: Cardiovascular;  Laterality: N/A;  . RADIOACTIVE SEED IMPLANT N/A 11/20/2015   Procedure: RADIOACTIVE SEED IMPLANT/BRACHYTHERAPY IMPLANT;  Surgeon: Hollice Espy, MD;  Location: [facility];  Service: Urology;  Laterality: N/A;  . TONSILLECTOMY       FAMILY HISTORY   Family History  Problem Relation Age of Onset  . Heart attack Mother   . Heart disease Mother   . Heart attack Father   . Kidney disease Neg Hx   . Prostate cancer Neg Hx   . Kidney cancer Neg Hx   . Bladder Cancer Neg Hx      SOCIAL HISTORY   Social History   Tobacco Use  . Smoking status: Current Every Day Smoker    Packs/day: 1.50  . Smokeless tobacco: Never Used  Substance Use Topics  . Alcohol use: Yes    Alcohol/week: 8.0 standard drinks    Types: 8 Glasses of wine per week    Comment: occ  . Drug use: No     MEDICATIONS    Home Medication:    Current Medication:  Current Facility-Administered Medications:  .  0.9 %  sodium chloride infusion, , Intravenous, Continuous, Mansy, Jan A, MD, Last Rate: 100 mL/hr at 11/01/20 2951, New  Bag at 11/01/20 0420 .  acetaminophen (TYLENOL) tablet 650 mg, 650 mg, Oral, Q6H PRN **OR** acetaminophen (TYLENOL) suppository 650 mg, 650 mg, Rectal, Q6H PRN, Mansy, Jan A, MD .  apixaban (ELIQUIS) tablet 5 mg, 5 mg, Oral, BID, Mansy, Jan A, MD, 5 mg at 11/01/20 9417 .  calcium-vitamin D (OSCAL WITH D) 500-200 MG-UNIT per tablet 1 tablet, 1 tablet, Oral, Daily, Mansy, Jan A, MD, 1 tablet at 11/01/20 0904 .  cefTRIAXone (ROCEPHIN) 1 g in sodium chloride 0.9 % 100 mL IVPB, 1 g, Intravenous, q1800, Mansy, Jan A, MD, Last Rate: 200 mL/hr at 10/31/20 1842, 1 g at 10/31/20 1842 .  clopidogrel (PLAVIX) tablet 75 mg, 75 mg, Oral, Q breakfast, Mansy, Jan A, MD, 75 mg at 11/01/20 0748 .  diltiazem (CARDIZEM CD) 24 hr capsule 120 mg, 120 mg, Oral, Daily, Teodoro Spray, MD, 120 mg at 11/01/20 0905 .  docusate sodium (COLACE) capsule 100 mg, 100 mg, Oral, BID, Mansy, Jan A, MD, 100 mg at 11/01/20 0904 .  ipratropium-albuterol (DUONEB) 0.5-2.5 (3) MG/3ML nebulizer solution 3 mL, 3 mL, Nebulization, Q6H, Mansy, Jan A, MD, 3 mL at 11/01/20 0906 .  magnesium hydroxide (MILK OF MAGNESIA) suspension 30 mL, 30 mL, Oral, Daily PRN, Mansy, Jan A, MD .  MEDLINE mouth rinse, 15 mL, Mouth Rinse, BID, Manuella Ghazi, Vipul, MD .  ondansetron (ZOFRAN) tablet 4 mg, 4 mg, Oral, Q6H PRN **OR** ondansetron (ZOFRAN) injection 4 mg, 4 mg, Intravenous, Q6H PRN, Mansy, Jan A, MD .  [COMPLETED] methylPREDNISolone sodium succinate (SOLU-MEDROL) 40 mg/mL injection 40 mg, 40 mg, Intravenous, Q6H, 40 mg at 10/31/20 1514 **FOLLOWED BY** predniSONE (DELTASONE) tablet 40 mg, 40 mg, Oral, Q breakfast, Mansy, Jan A, MD, 40 mg at 11/01/20 0748 .  rosuvastatin (CRESTOR) tablet 10 mg, 10 mg, Oral, Daily, Mansy, Jan A, MD, 10 mg at 11/01/20 4081 .  sodium chloride (OCEAN) 0.65 % nasal spray 1 spray, 1 spray, Each Nare, PRN, Max Sane, MD, 1 spray at 11/01/20 0121 .  traZODone (DESYREL) tablet 25 mg, 25 mg, Oral, QHS PRN, Mansy, Jan A, MD, 25 mg at 10/31/20 2110    ALLERGIES   Patient has no known allergies.     REVIEW OF SYSTEMS    Review of Systems:  Gen:  Denies  fever, sweats, chills weigh loss  HEENT: Denies blurred vision, double vision, ear pain, eye pain, hearing loss, nose bleeds, sore throat Cardiac:  No dizziness, chest pain or heaviness, chest tightness,edema Resp:   Denies cough or sputum porduction,++  shortness of breath, + rare wheezing, hemoptysis,  Gi: Denies swallowing difficulty, stomach pain, nausea or vomiting, diarrhea, constipation, bowel incontinence Gu:  Denies bladder incontinence, burning urine Ext:   Denies Joint pain, stiffness or swelling Skin: Denies  skin rash, easy bruising or bleeding or hives Endoc:  Denies polyuria, polydipsia , polyphagia or weight  change Psych:   Denies depression, insomnia or hallucinations   Other:  All other systems negative   VS: BP 109/70 (BP Location: Left Arm)   Pulse (!) 108   Temp 97.6 F (36.4 C) (Oral)   Resp 20   Ht 6' (1.829 m)   Wt 123.5 kg   SpO2 93%   BMI 36.93 kg/m      PHYSICAL EXAM    GENERAL:NAD, no fevers, chills, no weakness no fatigue,over weight,  HEAD: Normocephalic, atraumatic.  EYES: Pupils equal, round, reactive to light. Extraocular muscles intact. No scleral icterus.  MOUTH: Moist mucosal membrane.  Dentition intact. No abscess noted.  EAR, NOSE, THROAT: Clear without exudates. No external lesions.  NECK: Supple. No thyromegaly. No nodules. No JVD. Thick and very short PULMONARY: distant, no wheezing, decreased at the baseCARDIOVASCULAR: S1 and S2. Regular rate and rhythm. No murmurs, rubs, or gallops. No edema. Pedal pulses 2+ bilaterally.  GASTROINTESTINAL: Soft, nontender, nondistended. No masses. Positive bowel sounds. No hepatosplenomegaly.  MUSCULOSKELETAL: No swelling, clubbing, or edema. Range of motion full in all extremities.  NEUROLOGIC: Cranial nerves II through XII are intact. No gross focal neurological deficits. Sensation intact. Reflexes intact.  SKIN: No ulceration, lesions, rashes, or cyanosis. Skin warm and dry. Turgor intact.  PSYCHIATRIC: Mood, affect within normal limits. The patient is awake, alert and oriented x 3. Insight, judgment intact.       IMAGING    DG Chest Port 1 View  Result Date: 10/30/2020 CLINICAL DATA:  Shortness of breath EXAM: PORTABLE CHEST 1 VIEW COMPARISON:  10/21/2017 FINDINGS: No consolidation or effusion. Emphysematous disease with bronchitic changes at the bases. Borderline cardiomegaly with aortic atherosclerosis. No pneumothorax. IMPRESSION: No active disease. Emphysematous disease with bronchitic changes at the bases. Electronically Signed   By: Donavan Foil M.D.   On: 10/30/2020 15:30   ECHOCARDIOGRAM  COMPLETE  Result Date: 10/31/2020    ECHOCARDIOGRAM REPORT   Patient Name:   Robert Powell Date of Exam: 10/31/2020 Medical Rec #:  [id number]       Height:       72.0 in Accession #:    [id number]      Weight:       273.6 lb Date of Birth:  1951-07-24        BSA:          2.434 m Patient Age:    70 years        BP:           83/62 mmHg Patient Gender: M               HR:           116 bpm. Exam Location:  [facility] Procedure: 2D Echo, Limited Color Doppler and Cardiac Doppler Indications:     I48.91 Atrial fibrillation  History:         Patient has no prior history of Echocardiogram examinations.                  COPD; Risk Factors:Former Smoker, Sleep Apnea, Dyslipidemia and                  Hypertension.  Sonographer:     Charmayne Sheer RDCS (AE) Referring Phys:  [id number] [place] Diagnosing Phys: Bartholome Bill MD  Sonographer Comments: Technically challenging study due to limited acoustic windows. Image acquisition challenging due to patient body habitus, Image acquisition challenging due to COPD and Image acquisition challenging due to respiratory motion. IMPRESSIONS  1. Left ventricular ejection fraction, by estimation, is 60 to 65%. The left ventricle has normal function. The left ventricle has no regional wall motion abnormalities. Left ventricular diastolic parameters were normal.  2. Right ventricular systolic function is normal. The right ventricular size is mildly enlarged.  3. Right atrial size was mildly dilated.  4. The mitral valve was not well visualized. No evidence of mitral valve regurgitation.  5. The aortic valve was not well visualized. Aortic valve regurgitation is not visualized. FINDINGS  Left Ventricle: Left ventricular ejection fraction, by estimation, is 60 to 65%. The left ventricle has  normal function. The left ventricle has no regional wall motion abnormalities. The left ventricular internal cavity size was normal in size. There is  no left ventricular hypertrophy. Left ventricular diastolic  parameters were normal. Right Ventricle: The right ventricular size is mildly enlarged. No increase in right ventricular wall thickness. Right ventricular systolic function is normal. Left Atrium: Left atrial size was normal in size. Right Atrium: Right atrial size was mildly dilated. Pericardium: There is no evidence of pericardial effusion. Mitral Valve: The mitral valve was not well visualized. No evidence of mitral valve regurgitation. MV peak gradient, 4.2 mmHg. The mean mitral valve gradient is 2.0 mmHg. Tricuspid Valve: The tricuspid valve is not well visualized. Tricuspid valve regurgitation is trivial. Aortic Valve: The aortic valve was not well visualized. Aortic valve regurgitation is not visualized. Aortic valve mean gradient measures 2.0 mmHg. Aortic valve peak gradient measures 4.0 mmHg. Pulmonic Valve: The pulmonic valve was not well visualized. Pulmonic valve regurgitation is not visualized. Aorta: The aortic root was not well visualized. IAS/Shunts: The interatrial septum was not well visualized.   LV Volumes (MOD) LV vol d, MOD A4C: 71.9 ml Diastology LV vol s, MOD A4C: 32.4 ml LV e' medial:    13.20 cm/s LV SV MOD A4C:     71.9 ml LV E/e' medial:  8.1                            LV e' lateral:   11.00 cm/s                            LV E/e' lateral: 9.7  RIGHT VENTRICLE RV Basal diam:  4.50 cm RV Mid diam:    4.80 cm LEFT ATRIUM           Index LA Vol (A4C): 80.2 ml 32.95 ml/m  AORTIC VALVE AV Vmax:           99.70 cm/s AV Vmean:          70.600 cm/s AV VTI:            0.171 m AV Peak Grad:      4.0 mmHg AV Mean Grad:      2.0 mmHg LVOT Vmax:         80.30 cm/s LVOT Vmean:        47.500 cm/s LVOT VTI:          0.118 m LVOT/AV VTI ratio: 0.69 MITRAL VALVE MV Area (PHT): 3.79 cm     SHUNTS MV Peak grad:  4.2 mmHg     Systemic VTI: 0.12 m MV Mean grad:  2.0 mmHg MV Vmax:       1.02 m/s MV Vmean:      52.8 cm/s MV Decel Time: 200 msec MV E velocity: 106.67 cm/s Bartholome Bill MD Electronically signed  by Bartholome Bill MD Signature Date/Time: 10/31/2020/4:06:09 PM    Final       ASSESSMENT/PLAN   Copd, acute on chronic dyspnea, aggravated by his obesity. On [place] 02 -duo neb q 6  -prednisone as ordered -following with you  Hx of osa, s/p upper air way surgery, not on cpap, c/o hypersomnolence. ? Narcoleptic.  -weight loss -trial of privigil 200-400 mg q day would be reasonable ( he refused)  Atrial fib , on eliguis, cardiology follow rate better -Following cardiology recs     Thank you for allowing me to  participate in the care of this patient.   Patient/Family are satisfied with care plan and all questions have been answered.  This document was prepared using Dragon voice recognition software and may include unintentional dictation errors.     Wallene Huh, M.D.  Division of [place]

## 2020-11-02 MED ORDER — DILTIAZEM HCL ER COATED BEADS 180 MG PO CP24
180.0000 mg | ORAL_CAPSULE | Freq: Every day | ORAL | Status: DC
Start: 2020-11-03 — End: 2020-11-02

## 2020-11-02 MED ORDER — APIXABAN 5 MG PO TABS: 5.0000 mg | ORAL_TABLET | Freq: Two times a day (BID) | ORAL | 1 refills | Status: DC

## 2020-11-02 MED ORDER — DILTIAZEM HCL ER COATED BEADS 180 MG PO CP24: 180.0000 mg | ORAL_CAPSULE | Freq: Every day | ORAL | 1 refills | Status: DC

## 2020-11-02 MED ORDER — PREDNISONE 20 MG PO TABS: 40.0000 mg | ORAL_TABLET | Freq: Every day | ORAL | 0 refills | Status: AC

## 2020-11-02 MED ORDER — CALCIUM CARBONATE-VITAMIN D 500-200 MG-UNIT PO TABS: 1.0000 | ORAL_TABLET | Freq: Every day | ORAL | 1 refills | Status: DC

## 2020-11-02 MED ORDER — DOCUSATE SODIUM 100 MG PO CAPS: 100.0000 mg | ORAL_CAPSULE | Freq: Two times a day (BID) | ORAL | 0 refills | Status: DC

## 2020-11-02 MED ORDER — AZITHROMYCIN 250 MG PO TABS: ORAL_TABLET | ORAL | 0 refills | Status: DC

## 2020-11-02 MED ORDER — CLOPIDOGREL BISULFATE 75 MG PO TABS: 75.0000 mg | ORAL_TABLET | Freq: Every day | ORAL | 4 refills | Status: DC

## 2020-11-02 NOTE — Progress Notes (Signed)
[facility]    SUBJECTIVE: Robert Powell is a 70 year old male with a past medical history significant for atrial fibrillation, anticoagulated with Eliquis, coronary artery disease s/p PCI to the LAD in 2017, carotid artery disease, oxygen-dependent emphysema, tobacco abuse, ethanol abuse, and hypertension who presented to the ED on 10/30/20 for a 5 day history of progressive dyspnea with associated wheezing and a productive cough.  He was noted to be in atrial fibrillation with RVR and was started on diltiazem drip.    10/31/20:  Robert Powell continues to experience shortness of breath which has slightly improved.  He denies chest pain, chest pressure, heart racing, or palpitations.  He denies lower extremity swelling, orthopnea, or PND.   11/01/20: Robert Powell is sitting up in bed, eating breakfast, in no acute distress.  He is off of BiPAP and on [place].  He continues to experience shortness of breath, but this has improved since yesterday.  He denies chest pain, chest pressure, palpitations, or heart racing.    11/02/20: Robert Powell continues to improve.  Breathing is much better.  Continues to deny chest pain, chest pressure, palpitations, or heart racing.    Vitals:   11/01/20 1957 11/01/20 2152 11/02/20 0221 11/02/20 0511  BP: 99/70   95/72  Pulse: 88   85  Resp: 19   20  Temp: 98.2 F (36.8 C)   98.2 F (36.8 C)  TempSrc: Oral     SpO2: 98% 94% 95% 97%  Weight:    124.9 kg  Height:         Intake/Output Summary (Last 24 hours) at 11/02/2020 8119 Last data filed at 11/02/2020 0500 Gross per 24 hour  Intake 5713.63 ml  Output 1700 ml  Net 4013.63 ml      PHYSICAL EXAM  General: Well developed, obese, in no acute distress HEENT:  Normocephalic and atramatic Neck:  No JVD.  Lungs: On [place]. Clear bilaterally to auscultation and percussion. Heart: Irregularly irregular, rate better controlled . Normal S1 and S2 without gallops or murmurs.  Abdomen: Bowel sounds are positive, abdomen soft  and non-tender  Msk:  Back normal.  Normal strength and tone for age. Extremities: No clubbing, cyanosis or edema.   Neuro: Alert and oriented X 3. Psych:  Good affect, responds appropriately   LABS: Basic Metabolic Panel: Recent Labs    10/31/20 0519 11/01/20 0517  NA 139 137  K 5.2* 4.5  CL 96* 95*  CO2 35* 35*  GLUCOSE 149* 154*  BUN 35* 42*  CREATININE 1.20 1.03  CALCIUM 8.5* 8.8*   Liver Function Tests: Recent Labs    10/30/20 1446  AST 17  ALT 14  ALKPHOS 49  BILITOT 0.7  PROT 7.3  ALBUMIN 3.7   No results for input(s): LIPASE, AMYLASE in the last 72 hours. CBC: Recent Labs    10/30/20 1446 10/31/20 0519 11/01/20 0517  WBC 9.7 4.7 11.9*  NEUTROABS 8.0*  --   --   HGB 12.6* 10.7* 11.0*  HCT 42.2 35.1* 35.1*  MCV 96.1 95.9 92.4  PLT 190 158 157   Cardiac Enzymes: No results for input(s): CKTOTAL, CKMB, CKMBINDEX, TROPONINI in the last 72 hours. BNP: Invalid input(s): POCBNP D-Dimer: No results for input(s): DDIMER in the last 72 hours. Hemoglobin A1C: No results for input(s): HGBA1C in the last 72 hours. Fasting Lipid Panel: No results for input(s): CHOL, HDL, LDLCALC, TRIG, CHOLHDL, LDLDIRECT in the last 72 hours. Thyroid Function Tests: No results for input(s):  TSH, T4TOTAL, T3FREE, THYROIDAB in the last 72 hours.  Invalid input(s): FREET3 Anemia Panel: No results for input(s): VITAMINB12, FOLATE, FERRITIN, TIBC, IRON, RETICCTPCT in the last 72 hours.  ECHOCARDIOGRAM COMPLETE  Result Date: 10/31/2020    ECHOCARDIOGRAM REPORT   Patient Name:   Robert Powell Date of Exam: 10/31/2020 Medical Rec #:  [id number]       Height:       72.0 in Accession #:    [id number]      Weight:       273.6 lb Date of Birth:  Jan 19, 1951        BSA:          2.434 m Patient Age:    33 years        BP:           83/62 mmHg Patient Gender: M               HR:           116 bpm. Exam Location:  [facility] Procedure: 2D Echo, Limited Color Doppler and Cardiac Doppler  Indications:     I48.91 Atrial fibrillation  History:         Patient has no prior history of Echocardiogram examinations.                  COPD; Risk Factors:Former Smoker, Sleep Apnea, Dyslipidemia and                  Hypertension.  Sonographer:     Charmayne Sheer RDCS (AE) Referring Phys:  [id number] [place] Diagnosing Phys: Bartholome Bill MD  Sonographer Comments: Technically challenging study due to limited acoustic windows. Image acquisition challenging due to patient body habitus, Image acquisition challenging due to COPD and Image acquisition challenging due to respiratory motion. IMPRESSIONS  1. Left ventricular ejection fraction, by estimation, is 60 to 65%. The left ventricle has normal function. The left ventricle has no regional wall motion abnormalities. Left ventricular diastolic parameters were normal.  2. Right ventricular systolic function is normal. The right ventricular size is mildly enlarged.  3. Right atrial size was mildly dilated.  4. The mitral valve was not well visualized. No evidence of mitral valve regurgitation.  5. The aortic valve was not well visualized. Aortic valve regurgitation is not visualized. FINDINGS  Left Ventricle: Left ventricular ejection fraction, by estimation, is 60 to 65%. The left ventricle has normal function. The left ventricle has no regional wall motion abnormalities. The left ventricular internal cavity size was normal in size. There is  no left ventricular hypertrophy. Left ventricular diastolic parameters were normal. Right Ventricle: The right ventricular size is mildly enlarged. No increase in right ventricular wall thickness. Right ventricular systolic function is normal. Left Atrium: Left atrial size was normal in size. Right Atrium: Right atrial size was mildly dilated. Pericardium: There is no evidence of pericardial effusion. Mitral Valve: The mitral valve was not well visualized. No evidence of mitral valve regurgitation. MV peak gradient, 4.2 mmHg. The  mean mitral valve gradient is 2.0 mmHg. Tricuspid Valve: The tricuspid valve is not well visualized. Tricuspid valve regurgitation is trivial. Aortic Valve: The aortic valve was not well visualized. Aortic valve regurgitation is not visualized. Aortic valve mean gradient measures 2.0 mmHg. Aortic valve peak gradient measures 4.0 mmHg. Pulmonic Valve: The pulmonic valve was not well visualized. Pulmonic valve regurgitation is not visualized. Aorta: The aortic root was not well visualized. IAS/Shunts: The interatrial septum was  not well visualized.   LV Volumes (MOD) LV vol d, MOD A4C: 71.9 ml Diastology LV vol s, MOD A4C: 32.4 ml LV e' medial:    13.20 cm/s LV SV MOD A4C:     71.9 ml LV E/e' medial:  8.1                            LV e' lateral:   11.00 cm/s                            LV E/e' lateral: 9.7  RIGHT VENTRICLE RV Basal diam:  4.50 cm RV Mid diam:    4.80 cm LEFT ATRIUM           Index LA Vol (A4C): 80.2 ml 32.95 ml/m  AORTIC VALVE AV Vmax:           99.70 cm/s AV Vmean:          70.600 cm/s AV VTI:            0.171 m AV Peak Grad:      4.0 mmHg AV Mean Grad:      2.0 mmHg LVOT Vmax:         80.30 cm/s LVOT Vmean:        47.500 cm/s LVOT VTI:          0.118 m LVOT/AV VTI ratio: 0.69 MITRAL VALVE MV Area (PHT): 3.79 cm     SHUNTS MV Peak grad:  4.2 mmHg     Systemic VTI: 0.12 m MV Mean grad:  2.0 mmHg MV Vmax:       1.02 m/s MV Vmean:      52.8 cm/s MV Decel Time: 200 msec MV E velocity: 106.67 cm/s Bartholome Bill MD Electronically signed by Bartholome Bill MD Signature Date/Time: 10/31/2020/4:06:09 PM    Final      Echo: Normal RV and LV systolic function with an EF estimated between 60-65% with no evidence of regional wall abnormalities; no significant valvular abnormalities noted   TELEMETRY: Atrial fibrillation, rate in the 90s   ASSESSMENT AND PLAN:  Active Problems:   COPD exacerbation (HCC)   Atrial fibrillation with rapid ventricular response (HCC)   Acute on chronic respiratory failure  with hypoxia ([place])    1.  Atrial fibrillation with RVR              -Became hypotensive on diltiazem drip; transitioned to oral diltiazem 120mg    -Rate has improved; currently in 90s  -Continue Eliquis 5mg  BID; stressed the importance of daily compliance and routine [facility]   2.  Hypertension              -Became hypotensive on diltiazem drip - transitioned to oral              -Lisinopril, HCTZ, Imdur, and tamsulosin all held due to symptomatic hypotension; will resume when appropriate              -Will continue diltiazem 120mg  daily for rate control and consider increasing back to home dose of 240mg  if pressure allows   3.  COPD   -Off of BiPAP, on [place]    -Pulmonology following   Cardiology signing off for now.  Please contact on call provider with any questions or concerns.   The history, physical exam findings, and plan of care were all discussed with  Dr. Bartholome Bill, and all decision making was made in collaboration.    Avie Arenas  PA-C 11/02/2020 7:28 AM

## 2020-11-02 NOTE — Plan of Care (Signed)

## 2020-11-02 NOTE — Progress Notes (Signed)
PT Cancellation Note  Patient Details Name: Robert Powell MRN: [id number] DOB: February 20, 1951   Cancelled Treatment:    Reason Eval/Treat Not Completed:  (Treatment session attempted.  Patient declined participation with session, citing plans for upcoming discharge this date, preferring to "save my energy for getting home".  Reviewed strategies and recommendations for energy conservation with upcoming discharge (including using golf cart to minimize walking from car to entrance, placing chair at top of entry stairs).  Patient voiced understanding of all information and recommendations, "I've been doing this for a while now".  Will continue efforts at later date/time if patient remains in house.)     Rayonna Heldman H. Owens Shark, PT, DPT, NCS 11/02/20, 10:38 AM [phone]

## 2020-11-02 NOTE — Care Management Important Message (Signed)
Important Message  Patient Details  Name: Robert Powell MRN: [id number] Date of Birth: Sep 17, 1950   Medicare Important Message Given:  Yes     Dannette Barbara 11/02/2020, 1:24 PM

## 2020-11-02 NOTE — TOC Transition Note (Signed)
Transition of Care [facility]) - CM/SW Discharge Note   Patient Details  Name: Robert Powell MRN: [id number] Date of Birth: [birth year]  [facility] CM/SW Contact:  Kerin Salen, RN Phone Number: 11/02/2020, 11:57 AM      Final next level of care: [place] Barriers to Discharge: Barriers Resolved   Patient Goals and CMS Choice Patient states their goals for this hospitalization and ongoing recovery are:: To return home.      Discharge Placement                  Name of family member notified: Wife, Montrae Braithwaite Patient and family notified of of transfer: 11/02/20  Discharge Plan and Services In-house Referral: Clinical Social Work Discharge Planning Services: NA Post Acute Care Choice: Durable Medical Equipment,Home Health          DME Arranged: Oxygen DME Agency: [employer] Date DME Agency Contacted: 11/02/20   Representative spoke with at DME Agency: [place]: [place]: [place] Date [place]: 11/02/20 Time HH Agency Contacted: 1000 Representative spoke with at [place]: [place]  Social Determinants of Health ([place]) Interventions     Readmission Risk Interventions No flowsheet data found.

## 2020-11-02 NOTE — Progress Notes (Signed)
Discharge instructions explained to pt/ verbalized an understanding/ iv and tele removed/ Eliquis coupon given to pt/ will transport off unit via wheelchair when his home 02 is delivered to room.

## 2020-11-02 NOTE — Discharge Summary (Signed)
Physician Discharge Summary  Robert Powell QZR:[id number] DOB: July 16, 1951 DOA: 10/30/2020  PCP: Merryl Hacker, No  Admit date: 10/30/2020 Discharge date: 11/02/2020  Admitted From: Home Disposition: Home  Recommendations for Outpatient Follow-up:  1. Follow up with PCP in 1-2 weeks 2. Follow-up with cardiology 3. Follow-up with pulmonology 4. Please obtain BMP/CBC in one week 5. Please follow up on the following pending results: None  Home Health: Yes Equipment/Devices: Home oxygen Discharge Condition: Stable CODE STATUS: DNR Diet recommendation: Heart Healthy / Carb Modified   Brief/Interim Summary: Robert L Clarkis a 70 y.o.malewith medical history significant for COPD, chronic respiratory failure on 3 L of oxygen at baseline, OSA not on CPAP, adenocarcinoma of prostate, alcohol abuse, essential hypertension, narcolepsy presented to ED with worsening dyspnea, wheezing and productive cough.  Admitted for COPD exacerbation. On presentation he was hypoxic and initially requiring BiPAP, later transitioned to home oxygen.  He was treated with steroid and bronchodilators along with azithromycin.  Patient responded very well and discharged home on 3 more days of Zithromax and prednisone.  Pulmonary was also consulted and he will follow-up with them as an outpatient.  He was also found to have atrial fibrillation with RVR most likely secondary to his COPD exacerbation.  Initially managed with Cardizem infusion which was later discontinued due to softer blood pressure and he was transitioned to p.o. Cardizem.  Cardiology was consulted and he was started on Eliquis along with Cardizem.  His home dose of Zestoretic was discontinued and he was discharged on Cardizem 180 mg daily along with Eliquis and he will follow-up with his PCP and cardiology as an outpatient for further recommendations.  He will continue rest of his home medications and follow-up with his providers.  Discharge Diagnoses:  Active  Problems:   COPD exacerbation (HCC)   Atrial fibrillation with rapid ventricular response (HCC)   Acute on chronic respiratory failure with hypoxia [facility])   Discharge Instructions  Discharge Instructions    Diet - low sodium heart healthy   Complete by: As directed    Discharge instructions   Complete by: As directed    It was pleasure taking care of you. We are holding your lisinopril and HCTZ combination pill for blood pressure as your blood pressure was low. Your cardiologist also added Cardizem for your atrial fibrillation, please take it as directed. Follow-up with cardiology and your pulmonologist for further recommendations. I am stopping your aspirin and you will continue with Plavix only as you are also taking Eliquis and all of these medications increase the risk of bleeding.  Your cardiologist should be able to manage and titrate.   Increase activity slowly   Complete by: As directed      Allergies as of 11/02/2020   No Known Allergies     Medication List    STOP taking these medications   aspirin EC 81 MG tablet   calcium-vitamin D 500-200 MG-UNIT tablet Replaced by: calcium-vitamin D 500-200 MG-UNIT tablet   lisinopril-hydrochlorothiazide 20-12.5 MG tablet Commonly known as: ZESTORETIC     TAKE these medications   albuterol 108 (90 Base) MCG/ACT inhaler Commonly known as: VENTOLIN HFA Inhale 2 puffs into the lungs every 4 (four) hours as needed for wheezing or shortness of breath.   albuterol (2.5 MG/3ML) 0.083% nebulizer solution Commonly known as: PROVENTIL Take 3 mLs by nebulization in the morning, at noon, in the evening, and at bedtime.   apixaban 5 MG Tabs tablet Commonly known as: Eliquis Take 1 tablet (5  mg total) by mouth 2 (two) times daily.   azithromycin 250 MG tablet Commonly known as: ZITHROMAX Take 1 tablet daily for next 3 days   calcium-vitamin D 500-200 MG-UNIT tablet Commonly known as: OSCAL WITH D Take 1 tablet by mouth  daily. Replaces: calcium-vitamin D 500-200 MG-UNIT tablet   cholecalciferol 25 MCG (1000 UNIT) tablet Commonly known as: VITAMIN D3 Take 1,000 Units by mouth daily.   clopidogrel 75 MG tablet Commonly known as: PLAVIX Take 1 tablet (75 mg total) by mouth daily with breakfast.   diltiazem 180 MG 24 hr capsule Commonly known as: CARDIZEM CD Take 1 capsule (180 mg total) by mouth daily. Start taking on: November 03, 2020   docusate sodium 100 MG capsule Commonly known as: COLACE Take 1 capsule (100 mg total) by mouth 2 (two) times daily.   LORazepam 0.5 MG tablet Commonly known as: ATIVAN Take 0.5 mg by mouth 2 (two) times daily as needed for anxiety.   predniSONE 20 MG tablet Commonly known as: DELTASONE Take 2 tablets (40 mg total) by mouth daily with breakfast for 5 days.   rosuvastatin 10 MG tablet Commonly known as: CRESTOR Take 10 mg by mouth daily.   tiotropium 18 MCG inhalation capsule Commonly known as: SPIRIVA Place 18 mcg into inhaler and inhale daily.            Durable Medical Equipment  (From admission, onward)         Start     Ordered   11/02/20 1116  For home use only DME oxygen  Once       Question Answer Comment  Length of Need Lifetime   Mode or (Route) Mask   Liters per Minute 3   Frequency Continuous (stationary and portable oxygen unit needed)   Oxygen conserving device Yes   Oxygen delivery system Gas      11/02/20 1116          Follow-up Information    Erby Pian, MD. Schedule an appointment as soon as possible for a visit in 1 week(s).   Specialty: Specialist Contact information: [place] [address] [phone]        Teodoro Spray, MD. Schedule an appointment as soon as possible for a visit in 1 week(s).   Specialty: Cardiology Contact information: [place] [address] [phone]              No Known  Allergies  Consultations:  Cardiology  Pulmonology  Procedures/Studies: [facility] Chest Port 1 View  Result Date: 10/30/2020 CLINICAL DATA:  Shortness of breath EXAM: PORTABLE CHEST 1 VIEW COMPARISON:  10/21/2017 FINDINGS: No consolidation or effusion. Emphysematous disease with bronchitic changes at the bases. Borderline cardiomegaly with aortic atherosclerosis. No pneumothorax. IMPRESSION: No active disease. Emphysematous disease with bronchitic changes at the bases. Electronically Signed   By: Donavan Foil M.D.   On: 10/30/2020 15:30   ECHOCARDIOGRAM COMPLETE  Result Date: 10/31/2020    ECHOCARDIOGRAM REPORT   Patient Name:   KANON NOVOSEL Date of Exam: 10/31/2020 Medical Rec #:  [id number]       Height:       72.0 in Accession #:    [id number]      Weight:       273.6 lb Date of Birth:  02/19/1951        BSA:          2.434 m Patient Age:    50 years  BP:           83/62 mmHg Patient Gender: M               HR:           116 bpm. Exam Location:  [facility] Procedure: 2D Echo, Limited Color Doppler and Cardiac Doppler Indications:     I48.91 Atrial fibrillation  History:         Patient has no prior history of Echocardiogram examinations.                  COPD; Risk Factors:Former Smoker, Sleep Apnea, Dyslipidemia and                  Hypertension.  Sonographer:     Charmayne Sheer RDCS (AE) Referring Phys:  [id number] [place] Diagnosing Phys: Bartholome Bill MD  Sonographer Comments: Technically challenging study due to limited acoustic windows. Image acquisition challenging due to patient body habitus, Image acquisition challenging due to COPD and Image acquisition challenging due to respiratory motion. IMPRESSIONS  1. Left ventricular ejection fraction, by estimation, is 60 to 65%. The left ventricle has normal function. The left ventricle has no regional wall motion abnormalities. Left ventricular diastolic parameters were normal.  2. Right ventricular systolic function is normal. The right ventricular size  is mildly enlarged.  3. Right atrial size was mildly dilated.  4. The mitral valve was not well visualized. No evidence of mitral valve regurgitation.  5. The aortic valve was not well visualized. Aortic valve regurgitation is not visualized. FINDINGS  Left Ventricle: Left ventricular ejection fraction, by estimation, is 60 to 65%. The left ventricle has normal function. The left ventricle has no regional wall motion abnormalities. The left ventricular internal cavity size was normal in size. There is  no left ventricular hypertrophy. Left ventricular diastolic parameters were normal. Right Ventricle: The right ventricular size is mildly enlarged. No increase in right ventricular wall thickness. Right ventricular systolic function is normal. Left Atrium: Left atrial size was normal in size. Right Atrium: Right atrial size was mildly dilated. Pericardium: There is no evidence of pericardial effusion. Mitral Valve: The mitral valve was not well visualized. No evidence of mitral valve regurgitation. MV peak gradient, 4.2 mmHg. The mean mitral valve gradient is 2.0 mmHg. Tricuspid Valve: The tricuspid valve is not well visualized. Tricuspid valve regurgitation is trivial. Aortic Valve: The aortic valve was not well visualized. Aortic valve regurgitation is not visualized. Aortic valve mean gradient measures 2.0 mmHg. Aortic valve peak gradient measures 4.0 mmHg. Pulmonic Valve: The pulmonic valve was not well visualized. Pulmonic valve regurgitation is not visualized. Aorta: The aortic root was not well visualized. IAS/Shunts: The interatrial septum was not well visualized.   LV Volumes (MOD) LV vol d, MOD A4C: 71.9 ml Diastology LV vol s, MOD A4C: 32.4 ml LV e' medial:    13.20 cm/s LV SV MOD A4C:     71.9 ml LV E/e' medial:  8.1                            LV e' lateral:   11.00 cm/s                            LV E/e' lateral: 9.7  RIGHT VENTRICLE RV Basal diam:  4.50 cm RV Mid diam:    4.80 cm LEFT ATRIUM  Index LA Vol (A4C): 80.2 ml 32.95 ml/m  AORTIC VALVE AV Vmax:           99.70 cm/s AV Vmean:          70.600 cm/s AV VTI:            0.171 m AV Peak Grad:      4.0 mmHg AV Mean Grad:      2.0 mmHg LVOT Vmax:         80.30 cm/s LVOT Vmean:        47.500 cm/s LVOT VTI:          0.118 m LVOT/AV VTI ratio: 0.69 MITRAL VALVE MV Area (PHT): 3.79 cm     SHUNTS MV Peak grad:  4.2 mmHg     Systemic VTI: 0.12 m MV Mean grad:  2.0 mmHg MV Vmax:       1.02 m/s MV Vmean:      52.8 cm/s MV Decel Time: 200 msec MV E velocity: 106.67 cm/s Bartholome Bill MD Electronically signed by Bartholome Bill MD Signature Date/Time: 10/31/2020/4:06:09 PM    Final      Subjective: Patient was feeling better when seen today.  No new complaint.  No shortness of breath.  Discharge Exam: Vitals:   11/02/20 0511 11/02/20 0907  BP: 95/72 113/80  Pulse: 85 (!) 113  Resp: 20 20  Temp: 98.2 F (36.8 C) 98.1 F (36.7 C)  SpO2: 97% 92%   Vitals:   11/01/20 2152 11/02/20 0221 11/02/20 0511 11/02/20 0907  BP:   95/72 113/80  Pulse:   85 (!) 113  Resp:   20 20  Temp:   98.2 F (36.8 C) 98.1 F (36.7 C)  TempSrc:    Oral  SpO2: 94% 95% 97% 92%  Weight:   124.9 kg   Height:        General: Pt is alert, awake, not in acute distress Cardiovascular: RRR, S1/S2 +, no rubs, no gallops Respiratory: CTA bilaterally, no wheezing, no rhonchi Abdominal: Soft, NT, ND, bowel sounds + Extremities: no edema, no cyanosis   The results of significant diagnostics from this hospitalization (including imaging, microbiology, ancillary and laboratory) are listed below for reference.    Microbiology: Recent Results (from the past 240 hour(s))  Resp Panel by RT-PCR (Flu A&B, Covid) Nasopharyngeal Swab     Status: None   Collection Time: 10/30/20  3:00 PM   Specimen: Nasopharyngeal Swab; Nasopharyngeal(NP) swabs in vial transport medium  Result Value Ref Range Status   SARS Coronavirus 2 by RT PCR NEGATIVE NEGATIVE Final    Comment:  (NOTE) SARS-CoV-2 target nucleic acids are NOT DETECTED.  The SARS-CoV-2 RNA is generally detectable in upper respiratory specimens during the acute phase of infection. The lowest concentration of SARS-CoV-2 viral copies this assay can detect is 138 copies/mL. A negative result does not preclude SARS-Cov-2 infection and should not be used as the sole basis for treatment or other patient management decisions. A negative result may occur with  improper specimen collection/handling, submission of specimen other than nasopharyngeal swab, presence of viral mutation(s) within the areas targeted by this assay, and inadequate number of viral copies(<138 copies/mL). A negative result must be combined with clinical observations, patient history, and epidemiological information. The expected result is Negative.  Fact Sheet for Patients:  EntrepreneurPulse.com.au  Fact Sheet for Healthcare Providers:  IncredibleEmployment.be  This test is no t yet approved or cleared by the [place] and  has been authorized for  detection and/or diagnosis of SARS-CoV-2 by FDA under an Emergency Use Authorization (EUA). This EUA will remain  in effect (meaning this test can be used) for the duration of the COVID-19 declaration under Section 564(b)(1) of the Act, 21 U.S.C.section 360bbb-3(b)(1), unless the authorization is terminated  or revoked sooner.       Influenza A by PCR NEGATIVE NEGATIVE Final   Influenza B by PCR NEGATIVE NEGATIVE Final    Comment: (NOTE) The Xpert Xpress SARS-CoV-2/FLU/RSV plus assay is intended as an aid in the diagnosis of influenza from Nasopharyngeal swab specimens and should not be used as a sole basis for treatment. Nasal washings and aspirates are unacceptable for Xpert Xpress SARS-CoV-2/FLU/RSV testing.  Fact Sheet for Patients: EntrepreneurPulse.com.au  Fact Sheet for Healthcare  Providers: IncredibleEmployment.be  This test is not yet approved or cleared by the [place] FDA and has been authorized for detection and/or diagnosis of SARS-CoV-2 by FDA under an Emergency Use Authorization (EUA). This EUA will remain in effect (meaning this test can be used) for the duration of the COVID-19 declaration under Section 564(b)(1) of the Act, 21 U.S.C. section 360bbb-3(b)(1), unless the authorization is terminated or revoked.  Performed at [facility], [address] [place], [place] [address]      Labs: BNP (last 3 results) Recent Labs    10/30/20 1447  BNP 258.5*   Basic Metabolic Panel: Recent Labs  Lab 10/30/20 1446 10/31/20 0519 11/01/20 0517  NA 139 139 137  K 4.9 5.2* 4.5  CL 92* 96* 95*  CO2 37* 35* 35*  GLUCOSE 149* 149* 154*  BUN 24* 35* 42*  CREATININE 1.01 1.20 1.03  CALCIUM 9.0 8.5* 8.8*   Liver Function Tests: Recent Labs  Lab 10/30/20 1446  AST 17  ALT 14  ALKPHOS 49  BILITOT 0.7  PROT 7.3  ALBUMIN 3.7   No results for input(s): LIPASE, AMYLASE in the last 168 hours. No results for input(s): AMMONIA in the last 168 hours. CBC: Recent Labs  Lab 10/30/20 1446 10/31/20 0519 11/01/20 0517  WBC 9.7 4.7 11.9*  NEUTROABS 8.0*  --   --   HGB 12.6* 10.7* 11.0*  HCT 42.2 35.1* 35.1*  MCV 96.1 95.9 92.4  PLT 190 158 157   Cardiac Enzymes: No results for input(s): CKTOTAL, CKMB, CKMBINDEX, TROPONINI in the last 168 hours. BNP: Invalid input(s): POCBNP CBG: No results for input(s): GLUCAP in the last 168 hours. D-Dimer No results for input(s): DDIMER in the last 72 hours. Hgb A1c No results for input(s): HGBA1C in the last 72 hours. Lipid Profile No results for input(s): CHOL, HDL, LDLCALC, TRIG, CHOLHDL, LDLDIRECT in the last 72 hours. Thyroid function studies No results for input(s): TSH, T4TOTAL, T3FREE, THYROIDAB in the last 72 hours.  Invalid input(s): FREET3 Anemia work up No  results for input(s): VITAMINB12, FOLATE, FERRITIN, TIBC, IRON, RETICCTPCT in the last 72 hours. Urinalysis No results found for: COLORURINE, APPEARANCEUR, [place], [place], GLUCOSEU, [place], [place], [place], PROTEINUR, UROBILINOGEN, NITRITE, LEUKOCYTESUR Sepsis Labs Invalid input(s): PROCALCITONIN,  WBC,  LACTICIDVEN Microbiology Recent Results (from the past 240 hour(s))  Resp Panel by RT-PCR (Flu A&B, Covid) Nasopharyngeal Swab     Status: None   Collection Time: 10/30/20  3:00 PM   Specimen: Nasopharyngeal Swab; Nasopharyngeal(NP) swabs in vial transport medium  Result Value Ref Range Status   SARS Coronavirus 2 by RT PCR NEGATIVE NEGATIVE Final    Comment: (NOTE) SARS-CoV-2 target nucleic acids are NOT DETECTED.  The SARS-CoV-2 RNA  is generally detectable in upper respiratory specimens during the acute phase of infection. The lowest concentration of SARS-CoV-2 viral copies this assay can detect is 138 copies/mL. A negative result does not preclude SARS-Cov-2 infection and should not be used as the sole basis for treatment or other patient management decisions. A negative result may occur with  improper specimen collection/handling, submission of specimen other than nasopharyngeal swab, presence of viral mutation(s) within the areas targeted by this assay, and inadequate number of viral copies(<138 copies/mL). A negative result must be combined with clinical observations, patient history, and epidemiological information. The expected result is Negative.  Fact Sheet for Patients:  EntrepreneurPulse.com.au  Fact Sheet for Healthcare Providers:  IncredibleEmployment.be  This test is no t yet approved or cleared by the [place] FDA and  has been authorized for detection and/or diagnosis of SARS-CoV-2 by FDA under an Emergency Use Authorization (EUA). This EUA will remain  in effect (meaning this test can be used) for the duration of  the COVID-19 declaration under Section 564(b)(1) of the Act, 21 U.S.C.section 360bbb-3(b)(1), unless the authorization is terminated  or revoked sooner.       Influenza A by PCR NEGATIVE NEGATIVE Final   Influenza B by PCR NEGATIVE NEGATIVE Final    Comment: (NOTE) The Xpert Xpress SARS-CoV-2/FLU/RSV plus assay is intended as an aid in the diagnosis of influenza from Nasopharyngeal swab specimens and should not be used as a sole basis for treatment. Nasal washings and aspirates are unacceptable for Xpert Xpress SARS-CoV-2/FLU/RSV testing.  Fact Sheet for Patients: EntrepreneurPulse.com.au  Fact Sheet for Healthcare Providers: IncredibleEmployment.be  This test is not yet approved or cleared by the [place] FDA and has been authorized for detection and/or diagnosis of SARS-CoV-2 by FDA under an Emergency Use Authorization (EUA). This EUA will remain in effect (meaning this test can be used) for the duration of the COVID-19 declaration under Section 564(b)(1) of the Act, 21 U.S.C. section 360bbb-3(b)(1), unless the authorization is terminated or revoked.  Performed at [facility], [address] [place], [place] [address]     Time coordinating discharge: Over 30 minutes  SIGNED:  Lorella Nimrod, MD  [facility] 11/02/2020, 11:21 AM  If 7PM-7AM, please contact night-coverage [email or web address]  This record has been created using Systems analyst. Errors have been sought and corrected,but may not always be located. Such creation errors do not reflect on the standard of care.

## 2020-11-02 NOTE — TOC Initial Note (Signed)
Transition of Care [facility]) - Initial/Assessment Note    Patient Details  Name: Robert Powell MRN: [id number] Date of Birth: 01/08/1951  Transition of [facility]) CM/SW Contact:    Kerin Salen, RN Phone Number: 11/02/2020, 11:40 AM  Clinical Narrative: Spoke with patient, who reports living at home with wife, not able to do much. Sleep in recliner bed, only go to bathroom if need to stool, uses urinal. Walk-in shower, however not able to use due to fear of falling and SOB. Wife cooks, shops and pick up medications from [facility] in [place]. Have not been to PCP since March 2021 due to [place], fear of falling and SOB.Uses Oxygen 3L continuous, service by [employer]. Nurse visits 2-3x week, did not know name of agency.             Called wife who told me [facility] was the [place]. Called [place] spoke with [place]. [employer] will bring Portable oxygen to room for transport home.  Expected Discharge Plan: [place] Barriers to Discharge: Continued Medical Work up   Patient Goals and CMS Choice Patient states their goals for this hospitalization and ongoing recovery are:: To return home.      Expected Discharge Plan and Services Expected Discharge Plan: [place] In-house Referral: Clinical Social Work Discharge Planning Services: NA Post Acute Care Choice: [place] arrangements for the past 2 months: Single Family Home Expected Discharge Date: 11/02/20               DME Arranged: Oxygen DME Agency: [employer] Date DME Agency Contacted: 11/02/20   Representative spoke with at DME Agency: [place]: PT,OT [place]: [facility] [place] Date [facility] Agency Contacted: 11/02/20 Time HH Agency Contacted: 1100 Representative spoke with at [place]: [place]  Prior Living Arrangements/Services Living arrangements for the past 2 months: [place] with:: Spouse Patient language and need  for interpreter reviewed:: Yes Do you feel safe going back to the place where you live?: Yes      Need for Family Participation in Patient Care: Yes (Comment) Care giver support system in place?: Yes (comment) Current home services: DME,Home RN Criminal Activity/Legal Involvement Pertinent to Current Situation/Hospitalization: No - Comment as needed  Activities of Daily Living      Permission Sought/Granted Permission sought to share information with : Case Manager Permission granted to share information with : No  Share Information with NAME: Wife Darrall Dears           Emotional Assessment Appearance:: Appears stated age Attitude/Demeanor/Rapport: Complaining Affect (typically observed): Sad Orientation: : Oriented to Self,Oriented to Place,Oriented to  Time,Oriented to Situation Alcohol / Substance Use: Not Applicable Psych Involvement: No (comment)  Admission diagnosis:  COPD exacerbation ([place]) [J44.1] Atrial fibrillation with rapid ventricular response (HCC) [I48.91] Acute on chronic respiratory failure with hypoxia ([place]) [J96.21] Patient Active Problem List   Diagnosis Date Noted  . Acute on chronic respiratory failure with hypoxia ([place])   . Atrial fibrillation with rapid ventricular response ([place]) 10/30/2020  . Chronic respiratory failure with hypoxia ([place]) 03/25/2019  . Chronic a-fib ([place]) 04/16/2018  . Moderate tricuspid insufficiency 12/08/2017  . Hyponatremia 04/16/2017  . CAD (coronary artery disease) 05/23/2016  . Unstable angina ([place]) 05/20/2016  . Continuous chronic alcoholism ([place]) 09/26/2015  . History of alcoholism ([place]) 09/26/2015  . Adenocarcinoma of prostate ([place]) 05/22/2015  . 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  Elevated PSA 03/14/2015  . BPH (benign prostatic hyperplasia) 03/14/2015  . Essential (primary) hypertension 02/18/2015  . Benign essential HTN 02/17/2015  . Elevated prostate specific antigen (PSA) 02/16/2015   . Cellulitis 09/21/2014  . Breathlessness on exertion 08/29/2014  . COPD exacerbation ([place]) 04/28/2014  . Lesion of external ear 04/28/2014  . AA (alcohol abuse) 02/28/2014  . 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 Apnea, sleep 02/28/2014  . Current tobacco use 02/28/2014   PCP:  Pcp, No Pharmacy:   [facility], [place] [address] Phone: [phone] Fax: [phone]     Social Determinants of Health (SDOH) Interventions    Readmission Risk Interventions No flowsheet data found.

## 2020-11-03 ENCOUNTER — Other Ambulatory Visit: Payer: Self-pay

## 2020-11-03 DIAGNOSIS — G4730 Sleep apnea, unspecified: Secondary | ICD-10-CM | POA: Diagnosis not present

## 2020-11-03 DIAGNOSIS — I2510 Atherosclerotic heart disease of native coronary artery without angina pectoris: Secondary | ICD-10-CM | POA: Diagnosis not present

## 2020-11-03 DIAGNOSIS — N401 Enlarged prostate with lower urinary tract symptoms: Secondary | ICD-10-CM | POA: Diagnosis not present

## 2020-11-03 DIAGNOSIS — J441 Chronic obstructive pulmonary disease with (acute) exacerbation: Secondary | ICD-10-CM

## 2020-11-03 DIAGNOSIS — E785 Hyperlipidemia, unspecified: Secondary | ICD-10-CM | POA: Diagnosis not present

## 2020-11-03 DIAGNOSIS — N138 Other obstructive and reflux uropathy: Secondary | ICD-10-CM | POA: Diagnosis not present

## 2020-11-03 DIAGNOSIS — I10 Essential (primary) hypertension: Secondary | ICD-10-CM | POA: Diagnosis not present

## 2020-11-03 DIAGNOSIS — J9620 Acute and chronic respiratory failure, unspecified whether with hypoxia or hypercapnia: Secondary | ICD-10-CM | POA: Diagnosis not present

## 2020-11-03 DIAGNOSIS — I4891 Unspecified atrial fibrillation: Secondary | ICD-10-CM | POA: Diagnosis not present

## 2020-11-03 NOTE — Patient Outreach (Signed)
[facility]) Care Management  11/03/2020  Robert Powell 03/01/1951 [id number]   Referral Date: 11/03/20 Referral Source: Hospital liaison Referral Reason: [facility] discharge   Outreach Attempt: spoke with patient.  He states that he is feel ok.  Discussed reason for call and [facility] further follow up and support. Patient reports that he has home health and others coming out to visit and declined to further engage with CM.    CM advised patient that CM would send letter and brochure for future reference.     Plan: RN CM will send letter and close case.   Jone Baseman, RN, MSN [facility] Care Management Care Management Coordinator Direct Line [phone] Toll Free: [phone]  Fax: [phone]

## 2020-11-05 DIAGNOSIS — J9611 Chronic respiratory failure with hypoxia: Secondary | ICD-10-CM | POA: Diagnosis not present

## 2020-11-06 DIAGNOSIS — J449 Chronic obstructive pulmonary disease, unspecified: Secondary | ICD-10-CM | POA: Diagnosis not present

## 2020-11-07 DIAGNOSIS — G4730 Sleep apnea, unspecified: Secondary | ICD-10-CM | POA: Diagnosis not present

## 2020-11-07 DIAGNOSIS — I4891 Unspecified atrial fibrillation: Secondary | ICD-10-CM | POA: Diagnosis not present

## 2020-11-07 DIAGNOSIS — J9620 Acute and chronic respiratory failure, unspecified whether with hypoxia or hypercapnia: Secondary | ICD-10-CM | POA: Diagnosis not present

## 2020-11-07 DIAGNOSIS — N138 Other obstructive and reflux uropathy: Secondary | ICD-10-CM | POA: Diagnosis not present

## 2020-11-07 DIAGNOSIS — E785 Hyperlipidemia, unspecified: Secondary | ICD-10-CM | POA: Diagnosis not present

## 2020-11-07 DIAGNOSIS — J441 Chronic obstructive pulmonary disease with (acute) exacerbation: Secondary | ICD-10-CM | POA: Diagnosis not present

## 2020-11-07 DIAGNOSIS — I10 Essential (primary) hypertension: Secondary | ICD-10-CM | POA: Diagnosis not present

## 2020-11-07 DIAGNOSIS — I2510 Atherosclerotic heart disease of native coronary artery without angina pectoris: Secondary | ICD-10-CM | POA: Diagnosis not present

## 2020-11-07 DIAGNOSIS — N401 Enlarged prostate with lower urinary tract symptoms: Secondary | ICD-10-CM | POA: Diagnosis not present

## 2020-11-09 DIAGNOSIS — I10 Essential (primary) hypertension: Secondary | ICD-10-CM | POA: Diagnosis not present

## 2020-11-09 DIAGNOSIS — I4891 Unspecified atrial fibrillation: Secondary | ICD-10-CM | POA: Diagnosis not present

## 2020-11-09 DIAGNOSIS — J441 Chronic obstructive pulmonary disease with (acute) exacerbation: Secondary | ICD-10-CM | POA: Diagnosis not present

## 2020-11-09 DIAGNOSIS — J9620 Acute and chronic respiratory failure, unspecified whether with hypoxia or hypercapnia: Secondary | ICD-10-CM | POA: Diagnosis not present

## 2020-11-09 DIAGNOSIS — I2510 Atherosclerotic heart disease of native coronary artery without angina pectoris: Secondary | ICD-10-CM | POA: Diagnosis not present

## 2020-11-09 DIAGNOSIS — E785 Hyperlipidemia, unspecified: Secondary | ICD-10-CM | POA: Diagnosis not present

## 2020-11-09 DIAGNOSIS — G4730 Sleep apnea, unspecified: Secondary | ICD-10-CM | POA: Diagnosis not present

## 2020-11-09 DIAGNOSIS — N138 Other obstructive and reflux uropathy: Secondary | ICD-10-CM | POA: Diagnosis not present

## 2020-11-09 DIAGNOSIS — N401 Enlarged prostate with lower urinary tract symptoms: Secondary | ICD-10-CM | POA: Diagnosis not present

## 2020-11-10 DIAGNOSIS — N138 Other obstructive and reflux uropathy: Secondary | ICD-10-CM | POA: Diagnosis not present

## 2020-11-10 DIAGNOSIS — I2510 Atherosclerotic heart disease of native coronary artery without angina pectoris: Secondary | ICD-10-CM | POA: Diagnosis not present

## 2020-11-10 DIAGNOSIS — J441 Chronic obstructive pulmonary disease with (acute) exacerbation: Secondary | ICD-10-CM | POA: Diagnosis not present

## 2020-11-10 DIAGNOSIS — N401 Enlarged prostate with lower urinary tract symptoms: Secondary | ICD-10-CM | POA: Diagnosis not present

## 2020-11-10 DIAGNOSIS — J9620 Acute and chronic respiratory failure, unspecified whether with hypoxia or hypercapnia: Secondary | ICD-10-CM | POA: Diagnosis not present

## 2020-11-10 DIAGNOSIS — E785 Hyperlipidemia, unspecified: Secondary | ICD-10-CM | POA: Diagnosis not present

## 2020-11-10 DIAGNOSIS — I10 Essential (primary) hypertension: Secondary | ICD-10-CM | POA: Diagnosis not present

## 2020-11-10 DIAGNOSIS — G4730 Sleep apnea, unspecified: Secondary | ICD-10-CM | POA: Diagnosis not present

## 2020-11-10 DIAGNOSIS — I4891 Unspecified atrial fibrillation: Secondary | ICD-10-CM | POA: Diagnosis not present

## 2020-11-13 ENCOUNTER — Encounter: Payer: Self-pay | Admitting: Emergency Medicine

## 2020-11-13 ENCOUNTER — Inpatient Hospital Stay
Admission: EM | Admit: 2020-11-13 | Discharge: 2020-11-15 | DRG: 189 | Disposition: A | Discharge status: Hospice | Attending: Internal Medicine | Admitting: Internal Medicine

## 2020-11-13 ENCOUNTER — Emergency Department

## 2020-11-13 ENCOUNTER — Other Ambulatory Visit: Payer: Self-pay

## 2020-11-13 DIAGNOSIS — I10 Essential (primary) hypertension: Secondary | ICD-10-CM | POA: Diagnosis not present

## 2020-11-13 DIAGNOSIS — F17210 Nicotine dependence, cigarettes, uncomplicated: Secondary | ICD-10-CM | POA: Diagnosis present

## 2020-11-13 DIAGNOSIS — E8729 Other acidosis: Secondary | ICD-10-CM

## 2020-11-13 DIAGNOSIS — Z7902 Long term (current) use of antithrombotics/antiplatelets: Secondary | ICD-10-CM | POA: Diagnosis not present

## 2020-11-13 DIAGNOSIS — Z9981 Dependence on supplemental oxygen: Secondary | ICD-10-CM | POA: Diagnosis not present

## 2020-11-13 DIAGNOSIS — J441 Chronic obstructive pulmonary disease with (acute) exacerbation: Secondary | ICD-10-CM | POA: Diagnosis not present

## 2020-11-13 DIAGNOSIS — I110 Hypertensive heart disease with heart failure: Secondary | ICD-10-CM | POA: Diagnosis present

## 2020-11-13 DIAGNOSIS — Z515 Encounter for palliative care: Secondary | ICD-10-CM

## 2020-11-13 DIAGNOSIS — R404 Transient alteration of awareness: Secondary | ICD-10-CM | POA: Diagnosis not present

## 2020-11-13 DIAGNOSIS — J80 Acute respiratory distress syndrome: Secondary | ICD-10-CM | POA: Diagnosis not present

## 2020-11-13 DIAGNOSIS — Z8249 Family history of ischemic heart disease and other diseases of the circulatory system: Secondary | ICD-10-CM

## 2020-11-13 DIAGNOSIS — Z8546 Personal history of malignant neoplasm of prostate: Secondary | ICD-10-CM | POA: Diagnosis not present

## 2020-11-13 DIAGNOSIS — C61 Malignant neoplasm of prostate: Secondary | ICD-10-CM | POA: Diagnosis present

## 2020-11-13 DIAGNOSIS — Z7189 Other specified counseling: Secondary | ICD-10-CM | POA: Diagnosis not present

## 2020-11-13 DIAGNOSIS — I4891 Unspecified atrial fibrillation: Secondary | ICD-10-CM | POA: Diagnosis present

## 2020-11-13 DIAGNOSIS — J9622 Acute and chronic respiratory failure with hypercapnia: Secondary | ICD-10-CM | POA: Diagnosis present

## 2020-11-13 DIAGNOSIS — F1020 Alcohol dependence, uncomplicated: Secondary | ICD-10-CM | POA: Diagnosis present

## 2020-11-13 DIAGNOSIS — G9341 Metabolic encephalopathy: Secondary | ICD-10-CM | POA: Diagnosis present

## 2020-11-13 DIAGNOSIS — N401 Enlarged prostate with lower urinary tract symptoms: Secondary | ICD-10-CM | POA: Diagnosis not present

## 2020-11-13 DIAGNOSIS — R0689 Other abnormalities of breathing: Secondary | ICD-10-CM | POA: Diagnosis not present

## 2020-11-13 DIAGNOSIS — J9620 Acute and chronic respiratory failure, unspecified whether with hypoxia or hypercapnia: Secondary | ICD-10-CM | POA: Diagnosis not present

## 2020-11-13 DIAGNOSIS — G4733 Obstructive sleep apnea (adult) (pediatric): Secondary | ICD-10-CM | POA: Diagnosis present

## 2020-11-13 DIAGNOSIS — Z7901 Long term (current) use of anticoagulants: Secondary | ICD-10-CM

## 2020-11-13 DIAGNOSIS — Z20822 Contact with and (suspected) exposure to covid-19: Secondary | ICD-10-CM | POA: Diagnosis present

## 2020-11-13 DIAGNOSIS — R0600 Dyspnea, unspecified: Secondary | ICD-10-CM | POA: Diagnosis not present

## 2020-11-13 DIAGNOSIS — I501 Left ventricular failure: Secondary | ICD-10-CM

## 2020-11-13 DIAGNOSIS — J439 Emphysema, unspecified: Secondary | ICD-10-CM | POA: Diagnosis present

## 2020-11-13 DIAGNOSIS — E872 Acidosis: Secondary | ICD-10-CM | POA: Diagnosis present

## 2020-11-13 DIAGNOSIS — Z66 Do not resuscitate: Secondary | ICD-10-CM | POA: Diagnosis present

## 2020-11-13 DIAGNOSIS — I2510 Atherosclerotic heart disease of native coronary artery without angina pectoris: Secondary | ICD-10-CM | POA: Diagnosis present

## 2020-11-13 DIAGNOSIS — J9621 Acute and chronic respiratory failure with hypoxia: Principal | ICD-10-CM

## 2020-11-13 DIAGNOSIS — I509 Heart failure, unspecified: Secondary | ICD-10-CM | POA: Diagnosis not present

## 2020-11-13 DIAGNOSIS — G47419 Narcolepsy without cataplexy: Secondary | ICD-10-CM | POA: Diagnosis present

## 2020-11-13 DIAGNOSIS — M2550 Pain in unspecified joint: Secondary | ICD-10-CM | POA: Diagnosis not present

## 2020-11-13 DIAGNOSIS — R4020 Unspecified coma: Secondary | ICD-10-CM | POA: Diagnosis not present

## 2020-11-13 DIAGNOSIS — F419 Anxiety disorder, unspecified: Secondary | ICD-10-CM | POA: Diagnosis present

## 2020-11-13 DIAGNOSIS — J449 Chronic obstructive pulmonary disease, unspecified: Secondary | ICD-10-CM

## 2020-11-13 DIAGNOSIS — L8990 Pressure ulcer of unspecified site, unspecified stage: Secondary | ICD-10-CM | POA: Insufficient documentation

## 2020-11-13 DIAGNOSIS — Z7401 Bed confinement status: Secondary | ICD-10-CM | POA: Diagnosis not present

## 2020-11-13 DIAGNOSIS — F1010 Alcohol abuse, uncomplicated: Secondary | ICD-10-CM

## 2020-11-13 DIAGNOSIS — R0902 Hypoxemia: Secondary | ICD-10-CM | POA: Diagnosis not present

## 2020-11-13 DIAGNOSIS — E785 Hyperlipidemia, unspecified: Secondary | ICD-10-CM | POA: Diagnosis not present

## 2020-11-13 LAB — BRAIN NATRIURETIC PEPTIDE: B Natriuretic Peptide: 405.4 pg/mL — ABNORMAL HIGH (ref 0.0–100.0)

## 2020-11-13 LAB — CBC WITH DIFFERENTIAL/PLATELET
Abs Immature Granulocytes: 0.16 10*3/uL — ABNORMAL HIGH (ref 0.00–0.07)
Basophils Absolute: 0.1 10*3/uL (ref 0.0–0.1)
Basophils Relative: 1 %
Eosinophils Absolute: 0.2 10*3/uL (ref 0.0–0.5)
Eosinophils Relative: 1 %
HCT: 43.5 % (ref 39.0–52.0)
Hemoglobin: 12.8 g/dL — ABNORMAL LOW (ref 13.0–17.0)
Immature Granulocytes: 1 %
Lymphocytes Relative: 11 %
Lymphs Abs: 2 10*3/uL (ref 0.7–4.0)
MCH: 28.6 pg (ref 26.0–34.0)
MCHC: 29.4 g/dL — ABNORMAL LOW (ref 30.0–36.0)
MCV: 97.3 fL (ref 80.0–100.0)
Monocytes Absolute: 0.8 10*3/uL (ref 0.1–1.0)
Monocytes Relative: 5 %
Neutro Abs: 14.9 10*3/uL — ABNORMAL HIGH (ref 1.7–7.7)
Neutrophils Relative %: 81 %
Platelets: 225 10*3/uL (ref 150–400)
RBC: 4.47 MIL/uL (ref 4.22–5.81)
RDW: 13 % (ref 11.5–15.5)
WBC: 18.2 10*3/uL — ABNORMAL HIGH (ref 4.0–10.5)
nRBC: 0 % (ref 0.0–0.2)

## 2020-11-13 LAB — COMPREHENSIVE METABOLIC PANEL
ALT: 21 U/L (ref 0–44)
AST: 20 U/L (ref 15–41)
Albumin: 3.7 g/dL (ref 3.5–5.0)
Alkaline Phosphatase: 52 U/L (ref 38–126)
Anion gap: 9 (ref 5–15)
BUN: 19 mg/dL (ref 8–23)
CO2: 34 mmol/L — ABNORMAL HIGH (ref 22–32)
Calcium: 8.8 mg/dL — ABNORMAL LOW (ref 8.9–10.3)
Chloride: 95 mmol/L — ABNORMAL LOW (ref 98–111)
Creatinine, Ser: 0.92 mg/dL (ref 0.61–1.24)
GFR, Estimated: >60 mL/min (ref >60)
Glucose, Bld: 140 mg/dL — ABNORMAL HIGH (ref 70–99)
Potassium: 4.8 mmol/L (ref 3.5–5.1)
Sodium: 138 mmol/L (ref 135–145)
Total Bilirubin: 0.8 mg/dL (ref 0.3–1.2)
Total Protein: 7 g/dL (ref 6.5–8.1)

## 2020-11-13 LAB — BLOOD GAS, VENOUS
Acid-Base Excess: 11.3 mmol/L — ABNORMAL HIGH (ref 0.0–2.0)
Bicarbonate: 43.6 mmol/L — ABNORMAL HIGH (ref 20.0–28.0)
O2 Saturation: 93.4 %
Patient temperature: 37
pCO2, Ven: 109 mmHg (ref 44.0–60.0)
pH, Ven: 7.21 — ABNORMAL LOW (ref 7.250–7.430)
pO2, Ven: 82 mmHg — ABNORMAL HIGH (ref 32.0–45.0)

## 2020-11-13 LAB — TROPONIN I (HIGH SENSITIVITY): Troponin I (High Sensitivity): 12 ng/L (ref <18)

## 2020-11-13 MED ORDER — NITROGLYCERIN 2 % TD OINT
1.0000 [in_us] | TOPICAL_OINTMENT | Freq: Once | TRANSDERMAL | Status: AC
  Administered 2020-11-14: 1 [in_us] via TOPICAL
  Filled 2020-11-13: qty 1

## 2020-11-13 MED ORDER — ALBUTEROL SULFATE (2.5 MG/3ML) 0.083% IN NEBU
10.0000 mg | INHALATION_SOLUTION | Freq: Once | RESPIRATORY_TRACT | Status: AC
  Administered 2020-11-13: 10 mg via RESPIRATORY_TRACT
  Filled 2020-11-13: qty 12

## 2020-11-13 MED ORDER — FUROSEMIDE 10 MG/ML IJ SOLN
40.0000 mg | Freq: Once | INTRAMUSCULAR | Status: AC
  Administered 2020-11-14: 40 mg via INTRAVENOUS
  Filled 2020-11-13: qty 4

## 2020-11-13 MED ORDER — DILTIAZEM HCL 25 MG/5ML IV SOLN
20.0000 mg | Freq: Once | INTRAVENOUS | Status: AC
  Administered 2020-11-13: 20 mg via INTRAVENOUS
  Filled 2020-11-13: qty 5

## 2020-11-13 NOTE — ED Triage Notes (Signed)
Patient arrives via EMS form home in respiratory arrest. Patient's power went out at home and lost O2 and normally wears 4L. Patient has DNR bracelet but no form.

## 2020-11-13 NOTE — ED Provider Notes (Signed)
[facility] Emergency Department Provider Note  ____________________________________________  Time seen: Approximately 11:48 PM  I have reviewed the triage vital signs and the nursing notes.   HISTORY  Chief Complaint Respiratory Arrest    Level 5 Caveat: Portions of the History and Physical including HPI and review of systems are unable to be completely obtained due to patient altered mental status  HPI XAVIOR NIAZI is a 70 y.o. male with a history of alcoholism, COPD, hypertension, on 4 L nasal cannula at all times at home is brought to the ED due to respiratory distress.  Reportedly power went out at the patient's home and he was without his oxygen today.  Patient not able to provide any meaningful history due to being somnolent.      Past Medical History:  Diagnosis Date  . AA (alcohol abuse) 02/28/2014  . Adenocarcinoma of prostate ([place]) 05/22/2015  . Alcohol abuse   . Apnea, sleep 02/28/2014  . Benign essential HTN 02/17/2015  . 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 Cancer ([place])   . Cellulitis 09/21/2014  . Chronic obstructive pulmonary disease ([place]) 04/28/2014  . 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 COPD (chronic obstructive pulmonary disease) ([place])   . Elevated prostate specific antigen (PSA) 02/16/2015  . Elevated PSA    6.7 on 02/16/2015  . Essential (primary) hypertension 02/18/2015  . Genital herpes   . Hyperlipidemia   . Hypertension   . Lesion of external ear 04/28/2014  . Narcolepsy   . Rhinitis   . Sleep apnea    histort had surgery better  . Tobacco abuse      Patient Active Problem List   Diagnosis Date Noted  . Acute on chronic respiratory failure with hypoxia and hypercapnia ([place]) 11/14/2020  . Respiratory acidosis 11/14/2020  . Acute metabolic encephalopathy 44/31/5400  . Chronic anticoagulation 11/14/2020  . COPD with chronic bronchitis ([place]) 11/14/2020  . Acute on  chronic respiratory failure with hypoxia ([place])   . Atrial fibrillation with rapid ventricular response ([place]) 10/30/2020  . Chronic respiratory failure with hypoxia ([place]) 03/25/2019  . Chronic a-fib ([place]) 04/16/2018  . Moderate tricuspid insufficiency 12/08/2017  . Hyponatremia 04/16/2017  . CAD (coronary artery disease) 05/23/2016  . Unstable angina ([place]) 05/20/2016  . Alcohol use disorder, moderate, dependence ([place]) 09/26/2015  . History of alcoholism ([place]) 09/26/2015  . Adenocarcinoma of prostate ([place]) 05/22/2015  . 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 Elevated PSA 03/14/2015  . BPH (benign prostatic hyperplasia) 03/14/2015  . Essential (primary) hypertension 02/18/2015  . Benign essential HTN 02/17/2015  . Elevated prostate specific antigen (PSA) 02/16/2015  . Cellulitis 09/21/2014  . Breathlessness on exertion 08/29/2014  . COPD exacerbation ([place]) 04/28/2014  . Lesion of external ear 04/28/2014  . Alcohol use disorder, mild, abuse 02/28/2014  . 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 OSA (obstructive sleep apnea) 02/28/2014  . Current tobacco use 02/28/2014     Past Surgical History:  Procedure Laterality Date  . CARDIAC CATHETERIZATION Left 05/23/2016   Procedure: Left Heart Cath and Coronary Angiography;  Surgeon: Corey Skains, MD;  Location: [facility];  Service: Cardiovascular;  Laterality: Left;  . CARDIAC CATHETERIZATION N/A 05/23/2016   Procedure: Coronary Stent Intervention;  Surgeon: Isaias Cowman, MD;  Location: [facility];  Service: Cardiovascular;  Laterality: N/A;  . RADIOACTIVE SEED IMPLANT N/A 11/20/2015   Procedure: RADIOACTIVE SEED IMPLANT/BRACHYTHERAPY IMPLANT;  Surgeon: Caryl Pina  Erlene Quan, MD;  Location: [facility];  Service: Urology;  Laterality: N/A;  . TONSILLECTOMY       Prior to Admission medications   Medication Sig Start Date End Date Taking? Authorizing Provider  albuterol  (PROVENTIL) (2.5 MG/3ML) 0.083% nebulizer solution Take 3 mLs by nebulization in the morning, at noon, in the evening, and at bedtime. 09/21/20   [provider]  albuterol (VENTOLIN HFA) 108 (90 Base) MCG/ACT inhaler Inhale 2 puffs into the lungs every 4 (four) hours as needed for wheezing or shortness of breath.    [provider]  apixaban (ELIQUIS) 5 MG TABS tablet Take 1 tablet (5 mg total) by mouth 2 (two) times daily. 11/02/20   Lorella Nimrod, MD  azithromycin (ZITHROMAX) 250 MG tablet Take 1 tablet daily for next 3 days 11/02/20   Lorella Nimrod, MD  calcium-vitamin D (OSCAL WITH D) 500-200 MG-UNIT tablet Take 1 tablet by mouth daily. 11/02/20   Lorella Nimrod, MD  cholecalciferol (VITAMIN D3) 25 MCG (1000 UNIT) tablet Take 1,000 Units by mouth daily.    [provider]  clopidogrel (PLAVIX) 75 MG tablet Take 1 tablet (75 mg total) by mouth daily with breakfast. 11/02/20   Lorella Nimrod, MD  diltiazem (CARDIZEM CD) 180 MG 24 hr capsule Take 1 capsule (180 mg total) by mouth daily. 11/03/20   Lorella Nimrod, MD  docusate sodium (COLACE) 100 MG capsule Take 1 capsule (100 mg total) by mouth 2 (two) times daily. 11/02/20   Lorella Nimrod, MD  LORazepam (ATIVAN) 0.5 MG tablet Take 0.5 mg by mouth 2 (two) times daily as needed for anxiety. 10/06/20   [provider]  rosuvastatin (CRESTOR) 10 MG tablet Take 10 mg by mouth daily.    [provider]  tiotropium (SPIRIVA) 18 MCG inhalation capsule Place 18 mcg into inhaler and inhale daily.    [provider]     Allergies Patient has no known allergies.   Family History  Problem Relation Age of Onset  . Heart attack Mother   . Heart disease Mother   . Heart attack Father   . Kidney disease Neg Hx   . Prostate cancer Neg Hx   . Kidney cancer Neg Hx   . Bladder Cancer Neg Hx     Social History Social History   Tobacco Use  . Smoking status: Current Every Day Smoker    Packs/day: 1.50  .  Smokeless tobacco: Never Used  Substance Use Topics  . Alcohol use: Yes    Alcohol/week: 8.0 standard drinks    Types: 8 Glasses of wine per week    Comment: occ  . Drug use: No    Review of Systems Level 5 Caveat: Portions of the History and Physical including HPI and review of systems are unable to be completely obtained due to patient altered mental status  Constitutional:   No known fever.  ENT:   No rhinorrhea. Cardiovascular:   No chest pain or syncope. Respiratory:   Positive shortness of breath. Gastrointestinal:   Negative for abdominal pain, vomiting and diarrhea.  Musculoskeletal:   Negative for focal pain or swelling ____________________________________________   PHYSICAL EXAM:  VITAL SIGNS: ED Triage Vitals  Enc Vitals Group     BP 11/13/20 2300 (!) 179/113     Pulse Rate 11/13/20 2300 (!) 171     Resp 11/13/20 2300 (!) 40     Temp --      Temp src --      SpO2  11/13/20 2300 92 %     Weight 11/13/20 2301 280 lb (127 kg)     Height 11/13/20 2301 6' (1.829 m)     Head Circumference --      Peak Flow --      Pain Score 11/13/20 2300 6     Pain Loc --      Pain Edu? --      Excl. in [place]? --     Vital signs reviewed, nursing assessments reviewed.   Constitutional:   Somnolent, confused.  Ill-appearing, respiratory distress Eyes:   Conjunctivae are normal. EOMI. PERRL. ENT      Head:   Normocephalic and atraumatic.      Nose:   No congestion/rhinnorhea.       Mouth/Throat:   MMM, no pharyngeal erythema. No peritonsillar mass.       Neck:   No meningismus. Full ROM. Hematological/Lymphatic/Immunilogical:   No cervical lymphadenopathy. Cardiovascular:   Tachycardia heart rate 150, irregular rhythm/atrial fibrillation on the monitor. Symmetric bilateral radial and DP pulses.  No murmurs. Cap refill less than 2 seconds. Respiratory:   Shallow respirations, tachypnea with a respiratory rate of 40.  Diffuse expiratory wheezing. Gastrointestinal:   Soft and  nontender.  Moderately distended.  No rebound, rigidity, or guarding.  Musculoskeletal:   Normal range of motion in all extremities. No joint effusions.  No lower extremity tenderness.  No edema. Neurologic:   Somnolent, arousable to touch.  Not able to follow commands currently.    Skin:    Skin is warm, dry and intact. No rash noted.  No petechiae, purpura, or bullae.  ____________________________________________    LABS (pertinent positives/negatives) (all labs ordered are listed, but only abnormal results are displayed) Labs Reviewed  COMPREHENSIVE METABOLIC PANEL - Abnormal; Notable for the following components:      Result Value   Chloride 95 (*)    CO2 34 (*)    Glucose, Bld 140 (*)    Calcium 8.8 (*)    All other components within normal limits  BRAIN NATRIURETIC PEPTIDE - Abnormal; Notable for the following components:   B Natriuretic Peptide 405.4 (*)    All other components within normal limits  CBC WITH DIFFERENTIAL/PLATELET - Abnormal; Notable for the following components:   WBC 18.2 (*)    Hemoglobin 12.8 (*)    MCHC 29.4 (*)    Neutro Abs 14.9 (*)    Abs Immature Granulocytes 0.16 (*)    All other components within normal limits  BLOOD GAS, VENOUS - Abnormal; Notable for the following components:   pH, Ven 7.21 (*)    pCO2, Ven 109 (*)    pO2, Ven 82.0 (*)    Bicarbonate 43.6 (*)    Acid-Base Excess 11.3 (*)    All other components within normal limits  BLOOD GAS, VENOUS - Abnormal; Notable for the following components:   pH, Ven 7.21 (*)    pCO2, Ven 119 (*)    Bicarbonate 47.6 (*)    Acid-Base Excess 14.5 (*)    All other components within normal limits  BLOOD GAS, ARTERIAL - Abnormal; Notable for the following components:   pCO2 arterial 83 (*)    pO2, Arterial 75 (*)    Bicarbonate 45.8 (*)    Acid-Base Excess 16.1 (*)    Allens test (pass/fail) YES (*)    All other components within normal limits  TROPONIN I (HIGH SENSITIVITY) - Abnormal; Notable  for the following components:   Troponin I (High  Sensitivity) 19 (*)    All other components within normal limits  RESP PANEL BY RT-PCR (FLU A&B, COVID) ARPGX2  ETHANOL  HIV ANTIBODY (ROUTINE TESTING W REFLEX)  MAGNESIUM  PHOSPHORUS  TROPONIN I (HIGH SENSITIVITY)   ____________________________________________   EKG  Interpreted by me Atrial fibrillation with RVR, rate of 168.  Rightward axis.  Poor R wave progression.  Normal ST segments and T waves, no ischemic changes.  Repeat EKG obtained at 11:20 PM after IV diltiazem, shows atrial fibrillation rate of 113.  Normal axis and intervals.  Normal QRS ST segments and T waves.  ____________________________________________    PIRJJOACZ  DG Chest Port 1 View  Result Date: 11/13/2020 CLINICAL DATA:  Dyspnea EXAM: PORTABLE CHEST 1 VIEW COMPARISON:  10/30/2020 FINDINGS: The lungs are symmetrically well expanded. Since the prior examination, there has developed mild basilar predominant interstitial pulmonary infiltrate most in keeping with mild pulmonary edema, likely cardiogenic in nature. Bibasilar opacification has developed likely representing small bilateral pleural effusions. No pneumothorax. Cardiac size is at the upper limits of normal. IMPRESSION: Interval development of mild cardiogenic failure. Electronically Signed   By: Fidela Salisbury MD   On: 11/13/2020 23:22    ____________________________________________   PROCEDURES .Critical Care Performed by: Carrie Mew, MD Authorized by: Carrie Mew, MD   Critical care provider statement:    Critical care time (minutes):  45   Critical care time was exclusive of:  Separately billable procedures and treating other patients   Critical care was necessary to treat or prevent imminent or life-threatening deterioration of the following conditions:  Respiratory failure, cardiac failure and CNS failure or compromise   Critical care was time spent personally by me on the  following activities:  Development of treatment plan with patient or surrogate, discussions with consultants, evaluation of patient's response to treatment, examination of patient, obtaining history from patient or surrogate, ordering and performing treatments and interventions, ordering and review of laboratory studies, ordering and review of radiographic studies, pulse oximetry, re-evaluation of patient's condition and review of old charts    ____________________________________________  DIFFERENTIAL DIAGNOSIS   COPD exacerbation, pulmonary edema, pleural effusion, pneumonia, pneumothorax, non-STEMI, intoxication  CLINICAL IMPRESSION / ASSESSMENT AND PLAN / ED COURSE  Medications ordered in the ED: Medications  diltiazem (CARDIZEM) 1 mg/mL load via infusion 20 mg (20 mg Intravenous Bolus from Bag 11/14/20 0117)    And  diltiazem (CARDIZEM) 125 mg in dextrose 5% 125 mL (1 mg/mL) infusion (5 mg/hr Intravenous New Bag/Given 11/14/20 0115)  apixaban (ELIQUIS) tablet 5 mg (has no administration in time range)  acetaminophen (TYLENOL) tablet 650 mg (has no administration in time range)  ondansetron (ZOFRAN) injection 4 mg (has no administration in time range)  diltiazem (CARDIZEM) 125 mg in dextrose 5% 125 mL (1 mg/mL) infusion (5 mg/hr Intravenous Not Given 11/14/20 0248)  LORazepam (ATIVAN) tablet 1-4 mg (has no administration in time range)    Or  LORazepam (ATIVAN) injection 1-4 mg (has no administration in time range)  thiamine tablet 100 mg (has no administration in time range)    Or  thiamine (B-1) injection 100 mg (has no administration in time range)  folic acid (FOLVITE) tablet 1 mg (has no administration in time range)  multivitamin with minerals tablet 1 tablet (has no administration in time range)  LORazepam (ATIVAN) injection 0-4 mg (0 mg Intravenous Not Given 11/14/20 0255)    Followed by  LORazepam (ATIVAN) injection 0-4 mg (has no administration in time range)  diltiazem  (  CARDIZEM) injection 20 mg (20 mg Intravenous Given 11/13/20 2316)  albuterol (PROVENTIL) (2.5 MG/3ML) 0.083% nebulizer solution 10 mg (10 mg Nebulization Given 11/13/20 2345)  furosemide (LASIX) injection 40 mg (40 mg Intravenous Given 11/14/20 0004)  nitroGLYCERIN (NITROGLYN) 2 % ointment 1 inch (1 inch Topical Given 11/14/20 0005)    Pertinent labs & imaging results that were available during my care of the patient were reviewed by me and considered in my medical decision making (see chart for details).   SOHRAB KEELAN was evaluated in Emergency Department on 11/14/2020 for the symptoms described in the history of present illness. He was evaluated in the context of the global COVID-19 pandemic, which necessitated consideration that the patient might be at risk for infection with the SARS-CoV-2 virus that causes COVID-19. Institutional protocols and algorithms that pertain to the evaluation of patients at risk for COVID-19 are in a state of rapid change based on information released by regulatory bodies including the CDC and federal and state organizations. These policies and algorithms were followed during the patient's care in the ED.   Patient presents in respiratory distress.  Reportedly has a DNR/DNI directive, but no form is available, and so far family contacts have been unreachable by phone despite multiple attempts by charge RN.  Oxygenation is normalized on BiPAP which was started on arrival.  Chest x-ray viewed and interpreted by me, does show pulmonary edema without pneumothorax.  Will give Lasix and Nitropaste as well, in addition to bronchodilator nebs.  A. fib with RVR was treated with diltiazem 20 mg bolus which improved heart rate to 105-115, blood pressure stable.    Clinical Course as of 11/14/20 0319  Tue Nov 14, 2020  0145 Care discussed with the patient's wife who confirms DNR/DNI status.  Hospice nurse is at the home and will plan to bring the form to the hospital. [PS]     Clinical Course User Index [PS] Carrie Mew, MD    ----------------------------------------- 3:19 AM on 11/14/2020 -----------------------------------------  Rate controlled on atrial fibrillation, heart rate 95-110.  ABG much improved, likely close to baseline with normal pH.  DNR documentation arrived in ED.   ____________________________________________   FINAL CLINICAL IMPRESSION(S) / ED DIAGNOSES    Final diagnoses:  COPD with acute exacerbation ([place])  Acute on chronic respiratory failure with hypoxia and hypercapnia ([facility])  Pulmonary edema with congestive heart failure [facility])     ED Discharge Orders         Ordered    Amb referral to [facility] Clinic        11/14/20 0230          Portions of this note were generated with dragon dictation software. Dictation errors may occur despite best attempts at proofreading.   Carrie Mew, MD 11/14/20 [phone]

## 2020-11-14 DIAGNOSIS — Z8249 Family history of ischemic heart disease and other diseases of the circulatory system: Secondary | ICD-10-CM | POA: Diagnosis not present

## 2020-11-14 DIAGNOSIS — E872 Acidosis: Secondary | ICD-10-CM

## 2020-11-14 DIAGNOSIS — G4733 Obstructive sleep apnea (adult) (pediatric): Secondary | ICD-10-CM | POA: Diagnosis present

## 2020-11-14 DIAGNOSIS — Z515 Encounter for palliative care: Secondary | ICD-10-CM | POA: Diagnosis not present

## 2020-11-14 DIAGNOSIS — J449 Chronic obstructive pulmonary disease, unspecified: Secondary | ICD-10-CM

## 2020-11-14 DIAGNOSIS — G9341 Metabolic encephalopathy: Secondary | ICD-10-CM

## 2020-11-14 DIAGNOSIS — J9621 Acute and chronic respiratory failure with hypoxia: Secondary | ICD-10-CM | POA: Diagnosis present

## 2020-11-14 DIAGNOSIS — F419 Anxiety disorder, unspecified: Secondary | ICD-10-CM | POA: Diagnosis present

## 2020-11-14 DIAGNOSIS — I4891 Unspecified atrial fibrillation: Secondary | ICD-10-CM | POA: Diagnosis present

## 2020-11-14 DIAGNOSIS — Z7189 Other specified counseling: Secondary | ICD-10-CM

## 2020-11-14 DIAGNOSIS — Z7902 Long term (current) use of antithrombotics/antiplatelets: Secondary | ICD-10-CM | POA: Diagnosis not present

## 2020-11-14 DIAGNOSIS — J9622 Acute and chronic respiratory failure with hypercapnia: Secondary | ICD-10-CM

## 2020-11-14 DIAGNOSIS — Z20822 Contact with and (suspected) exposure to covid-19: Secondary | ICD-10-CM | POA: Diagnosis present

## 2020-11-14 DIAGNOSIS — F17210 Nicotine dependence, cigarettes, uncomplicated: Secondary | ICD-10-CM | POA: Diagnosis present

## 2020-11-14 DIAGNOSIS — I2510 Atherosclerotic heart disease of native coronary artery without angina pectoris: Secondary | ICD-10-CM | POA: Diagnosis present

## 2020-11-14 DIAGNOSIS — G47419 Narcolepsy without cataplexy: Secondary | ICD-10-CM | POA: Diagnosis present

## 2020-11-14 DIAGNOSIS — Z8546 Personal history of malignant neoplasm of prostate: Secondary | ICD-10-CM | POA: Diagnosis not present

## 2020-11-14 DIAGNOSIS — F1020 Alcohol dependence, uncomplicated: Secondary | ICD-10-CM | POA: Diagnosis present

## 2020-11-14 DIAGNOSIS — Z9981 Dependence on supplemental oxygen: Secondary | ICD-10-CM | POA: Diagnosis not present

## 2020-11-14 DIAGNOSIS — Z66 Do not resuscitate: Secondary | ICD-10-CM | POA: Diagnosis present

## 2020-11-14 DIAGNOSIS — I110 Hypertensive heart disease with heart failure: Secondary | ICD-10-CM | POA: Diagnosis present

## 2020-11-14 DIAGNOSIS — E8729 Other acidosis: Secondary | ICD-10-CM

## 2020-11-14 DIAGNOSIS — Z7901 Long term (current) use of anticoagulants: Secondary | ICD-10-CM | POA: Diagnosis not present

## 2020-11-14 DIAGNOSIS — J439 Emphysema, unspecified: Secondary | ICD-10-CM | POA: Diagnosis present

## 2020-11-14 LAB — BLOOD GAS, VENOUS
Acid-Base Excess: 14.5 mmol/L — ABNORMAL HIGH (ref 0.0–2.0)
Bicarbonate: 47.6 mmol/L — ABNORMAL HIGH (ref 20.0–28.0)
Delivery systems: POSITIVE
FIO2: 0.6
O2 Saturation: 51.6 %
Patient temperature: 37
Pressure support: 8 cmH2O
pCO2, Ven: 119 mmHg (ref 44.0–60.0)
pH, Ven: 7.21 — ABNORMAL LOW (ref 7.250–7.430)
pO2, Ven: 34 mmHg (ref 32.0–45.0)

## 2020-11-14 LAB — RESP PANEL BY RT-PCR (FLU A&B, COVID) ARPGX2
Influenza A by PCR: NEGATIVE
Influenza B by PCR: NEGATIVE
SARS Coronavirus 2 by RT PCR: NEGATIVE

## 2020-11-14 LAB — BLOOD GAS, ARTERIAL
Acid-Base Excess: 16.1 mmol/L — ABNORMAL HIGH (ref 0.0–2.0)
Bicarbonate: 45.8 mmol/L — ABNORMAL HIGH (ref 20.0–28.0)
Delivery systems: POSITIVE
Expiratory PAP: 10
FIO2: 0.5
Inspiratory PAP: 22
O2 Saturation: 94.2 %
Patient temperature: 37
pCO2 arterial: 83 mmHg (ref 32.0–48.0)
pH, Arterial: 7.35 (ref 7.350–7.450)
pO2, Arterial: 75 mmHg — ABNORMAL LOW (ref 83.0–108.0)

## 2020-11-14 LAB — GLUCOSE, CAPILLARY: Glucose-Capillary: 147 mg/dL — ABNORMAL HIGH (ref 70–99)

## 2020-11-14 LAB — MAGNESIUM: Magnesium: 1.7 mg/dL (ref 1.7–2.4)

## 2020-11-14 LAB — PHOSPHORUS: Phosphorus: 4.1 mg/dL (ref 2.5–4.6)

## 2020-11-14 LAB — TROPONIN I (HIGH SENSITIVITY): Troponin I (High Sensitivity): 19 ng/L — ABNORMAL HIGH (ref <18)

## 2020-11-14 LAB — ETHANOL: Alcohol, Ethyl (B): <10 mg/dL (ref <10)

## 2020-11-14 LAB — HIV ANTIBODY (ROUTINE TESTING W REFLEX): HIV Screen 4th Generation wRfx: NONREACTIVE

## 2020-11-14 MED ORDER — DILTIAZEM HCL-DEXTROSE 125-5 MG/125ML-% IV SOLN (PREMIX): 5.0000 mg/h | INTRAVENOUS | Status: DC

## 2020-11-14 MED ORDER — APIXABAN 5 MG PO TABS: 5.0000 mg | ORAL_TABLET | Freq: Two times a day (BID) | ORAL | Status: DC

## 2020-11-14 MED ORDER — ADULT MULTIVITAMIN W/MINERALS CH
1.0000 | ORAL_TABLET | Freq: Every day | ORAL | Status: DC
  Filled 2020-11-14: qty 1

## 2020-11-14 MED ORDER — LORAZEPAM 2 MG/ML IJ SOLN: 0.0000 mg | Freq: Two times a day (BID) | INTRAMUSCULAR | Status: DC

## 2020-11-14 MED ORDER — GLYCOPYRROLATE 0.2 MG/ML IJ SOLN: 0.2000 mg | INTRAMUSCULAR | Status: DC | PRN

## 2020-11-14 MED ORDER — CHLORHEXIDINE GLUCONATE CLOTH 2 % EX PADS: 6.0000 | MEDICATED_PAD | Freq: Every day | CUTANEOUS | Status: DC

## 2020-11-14 MED ORDER — ENSURE MAX PROTEIN PO LIQD
11.0000 [oz_av] | Freq: Two times a day (BID) | ORAL | Status: DC
  Filled 2020-11-14: qty 330

## 2020-11-14 MED ORDER — ENOXAPARIN SODIUM 150 MG/ML ~~LOC~~ SOLN
1.0000 mg/kg | SUBCUTANEOUS | Status: DC
  Administered 2020-11-14: 126 mg via SUBCUTANEOUS
  Filled 2020-11-14 (×2): qty 0.84

## 2020-11-14 MED ORDER — GLYCOPYRROLATE 1 MG PO TABS
1.0000 mg | ORAL_TABLET | ORAL | Status: DC | PRN
  Filled 2020-11-14: qty 1

## 2020-11-14 MED ORDER — DILTIAZEM LOAD VIA INFUSION: 20.0000 mg | Freq: Once | INTRAVENOUS | Status: DC

## 2020-11-14 MED ORDER — MAGNESIUM SULFATE 2 GM/50ML IV SOLN
2.0000 g | Freq: Once | INTRAVENOUS | Status: AC
  Administered 2020-11-14: 2 g via INTRAVENOUS
  Filled 2020-11-14: qty 50

## 2020-11-14 MED ORDER — ONDANSETRON HCL 4 MG/2ML IJ SOLN: 4.0000 mg | Freq: Four times a day (QID) | INTRAMUSCULAR | Status: DC | PRN

## 2020-11-14 MED ORDER — GLYCOPYRROLATE 0.2 MG/ML IJ SOLN
0.2000 mg | INTRAMUSCULAR | Status: DC | PRN
Start: 2020-11-14 — End: 2020-11-15
  Administered 2020-11-14: 0.2 mg via INTRAVENOUS
  Filled 2020-11-14: qty 1

## 2020-11-14 MED ORDER — LORAZEPAM 1 MG PO TABS
1.0000 mg | ORAL_TABLET | ORAL | Status: DC | PRN
Start: 2020-11-14 — End: 2020-11-15

## 2020-11-14 MED ORDER — LORAZEPAM 2 MG/ML IJ SOLN
0.0000 mg | Freq: Four times a day (QID) | INTRAMUSCULAR | Status: DC
  Administered 2020-11-14 (×3): 2 mg via INTRAVENOUS
  Filled 2020-11-14 (×3): qty 1

## 2020-11-14 MED ORDER — MORPHINE 100MG IN NS 100ML (1MG/ML) PREMIX INFUSION: 2.0000 mg/h | INTRAVENOUS | Status: DC

## 2020-11-14 MED ORDER — THIAMINE HCL 100 MG PO TABS
100.0000 mg | ORAL_TABLET | Freq: Every day | ORAL | Status: DC
  Filled 2020-11-14: qty 1

## 2020-11-14 MED ORDER — MORPHINE BOLUS VIA INFUSION
4.0000 mg | Freq: Once | INTRAVENOUS | Status: DC
  Filled 2020-11-14: qty 4

## 2020-11-14 MED ORDER — DILTIAZEM LOAD VIA INFUSION
20.0000 mg | Freq: Once | INTRAVENOUS | Status: AC
  Administered 2020-11-14: 20 mg via INTRAVENOUS

## 2020-11-14 MED ORDER — THIAMINE HCL 100 MG/ML IJ SOLN
100.0000 mg | Freq: Every day | INTRAMUSCULAR | Status: DC
  Administered 2020-11-14: 100 mg via INTRAVENOUS
  Filled 2020-11-14: qty 2

## 2020-11-14 MED ORDER — ACETAMINOPHEN 325 MG PO TABS: 650.0000 mg | ORAL_TABLET | ORAL | Status: DC | PRN

## 2020-11-14 MED ORDER — LORAZEPAM 2 MG/ML IJ SOLN: 1.0000 mg | INTRAMUSCULAR | Status: DC | PRN

## 2020-11-14 MED ORDER — LORAZEPAM 1 MG PO TABS: 1.0000 mg | ORAL_TABLET | ORAL | Status: DC | PRN

## 2020-11-14 MED ORDER — FOLIC ACID 1 MG PO TABS
1.0000 mg | ORAL_TABLET | Freq: Every day | ORAL | Status: DC
  Filled 2020-11-14: qty 1

## 2020-11-14 MED ORDER — MORPHINE SULFATE (PF) 2 MG/ML IV SOLN: 2.0000 mg | INTRAVENOUS | Status: DC | PRN

## 2020-11-14 MED ORDER — ASCORBIC ACID 500 MG PO TABS
250.0000 mg | ORAL_TABLET | Freq: Two times a day (BID) | ORAL | Status: DC
  Filled 2020-11-14: qty 1

## 2020-11-14 MED ORDER — LORAZEPAM 2 MG/ML PO CONC
1.0000 mg | ORAL | Status: DC | PRN
Start: 2020-11-14 — End: 2020-11-15

## 2020-11-14 MED ORDER — DILTIAZEM HCL-DEXTROSE 125-5 MG/125ML-% IV SOLN (PREMIX)
5.0000 mg/h | INTRAVENOUS | Status: DC
  Administered 2020-11-14: 5 mg/h via INTRAVENOUS
  Administered 2020-11-14 – 2020-11-15 (×2): 10 mg/h via INTRAVENOUS
  Filled 2020-11-14 (×2): qty 125

## 2020-11-14 NOTE — Progress Notes (Signed)
eLink Physician-Brief Progress Note Patient Name: Robert Powell DOB: [birth year] MRN: [id number]   Date of Service  11/14/2020  HPI/Events of Note  Patient admitted with acute on chronic respiratory failure secondary to acute exacerbation of COPD, and atrial fibrillation with rapid ventricular response rate. He is DNR.  eICU Interventions  New Patient Evaluation completed.        [name] 11/14/2020, 4:31 AM

## 2020-11-14 NOTE — Progress Notes (Signed)
OT Cancellation Note  Patient Details Name: Robert Powell MRN: [id number] DOB: Jan 18, 1951   Cancelled Treatment:    Reason Eval/Treat Not Completed: Patient not medically ready. Order received and chart reviewed. Per RN, will hold this date as pt is lethargic and pending ongoing work up. Will plan to see next date to initiate services as appropriate. Thank you.   Dessie Coma, M.S. OTR/L  11/14/20, 9:43 AM  ascom [phone]

## 2020-11-14 NOTE — ED Notes (Signed)
HOSPICE RN DELIVERED DNR TO EMERGENCY DEPARTMENT PER REQUEST OF SPOUSE.   DNR NOW WITH PATIENT

## 2020-11-14 NOTE — Progress Notes (Signed)
Initial Nutrition Assessment  DOCUMENTATION CODES:   Obesity unspecified  INTERVENTION:   Ensure Max protein supplement BID, each supplement provides 150kcal and 30g of protein.  MVI po daily   Vitamin C 255m po BID  NUTRITION DIAGNOSIS:   Increased nutrient needs related to catabolic illness (COPD, prostate cancer) as evidenced by estimated needs.  GOAL:   Patient will meet greater than or equal to 90% of their needs  MONITOR:   PO intake,Supplement acceptance,Labs,Weight trends,Skin,I & O's  REASON FOR ASSESSMENT:   Consult Assessment of nutrition requirement/status  ASSESSMENT:   70y.o. male with medical history significant for CAD, alcohol use disorder, HTN, prostate cancer, A. fib on apixaban, COPD on home O2 at 4 L, narcolepsy and OSA s/p upper airway surgerynot on CPAP, recently hospitalized from 2/21-2/24 with COPD exacerbation and rapid A. fib requiring diltiazem who was brought in by EMS in severe respiratory distress  Met with pt in room today. Pt on bipap at time of RD visit. Pt with good appetite and oral intake at baseline. Pt was eating 100% of meals during his last admit on 2/24. Pt initiated on a heart healthy diet today. RD will add supplements and vitamins to help pt meet his estimated needs and to support wound healing. Per chart, pt appears weight stable at baseline.   Medications reviewed and include: lovenox, folic acid, MVI, thiamine   Labs reviewed: K 4.8 wnl- 3/7 P 4.1 wnl, Mg 1.7 wnl BNP- 405(H)- 3/7 Wbc- 18.2(H)  NUTRITION - FOCUSED PHYSICAL EXAM:  Flowsheet Row Most Recent Value  Orbital Region No depletion  Upper Arm Region No depletion  Thoracic and Lumbar Region No depletion  Buccal Region No depletion  Temple Region No depletion  Clavicle Bone Region No depletion  Clavicle and Acromion Bone Region No depletion  Scapular Bone Region No depletion  Dorsal Hand No depletion  Patellar Region No depletion  Anterior Thigh Region No  depletion  Posterior Calf Region No depletion  Edema (RD Assessment) Moderate  Hair Reviewed  Eyes Reviewed  Mouth Reviewed  Skin Reviewed  Nails Reviewed     Diet Order:   Diet Order            Diet Heart Room service appropriate? Yes; Fluid consistency: Thin  Diet effective now                EDUCATION NEEDS:   Education needs have been addressed  Skin:  Skin Assessment: Reviewed RN Assessment (ecchymosis, Stage I sacrum)  Last BM:  pta  Height:   Ht Readings from Last 1 Encounters:  11/13/20 6' (1.829 m)    Weight:   Wt Readings from Last 1 Encounters:  11/13/20 127 kg    Ideal Body Weight:  80.9 kg  BMI:  Body mass index is 37.97 kg/m.  Estimated Nutritional Needs:   Kcal:  2500-2800kcal/day  Protein:  >125g/day  Fluid:  2.1-2.4L/day  CKoleen DistanceMS, RD, LDN Please refer to [facility]for RD and/or RD on-call/weekend/after hours pager

## 2020-11-14 NOTE — Progress Notes (Signed)
PT Cancellation Note  Patient Details Name: Robert Powell MRN: [id number] DOB: [birth year]   Cancelled Treatment:    Reason Eval/Treat Not Completed: Fatigue/lethargy limiting ability to participate: Nursing requesting PT/OT hold this date secondary to pt lethargy with pt not currently appropriate for PT/OT services. Will attempt to see pt at a future date/time as medically appropriate.     Linus Salmons PT, DPT 11/14/20, 9:43 AM

## 2020-11-14 NOTE — Progress Notes (Signed)
Patient ID: Robert Powell, male   DOB: September 19, 1950, 69 y.o.   MRN: [id number] Patient seen and examined.  H&P reviewed.  This is a no charge progress note as patient was admitted this AM. Robert Powell is a 70 y.o. male with medical history significant for CAD, alcohol use disorder, HTN, prostate cancer, A. fib on apixaban, COPD on home O2 at 4 L, narcolepsy and OSA s/p upper airway surgerynot on CPAP, recently hospitalized from 2/21-2/24 with COPD exacerbation and rapid A. fib requiring diltiazem who was brought in by EMS in severe respiratory distress, that started after power went out at home and patient had no backup oxygen.  History is taken from daughter at bedside due to patient's clinical condition.  Patient was previously at his baseline.  Except for recent past hospitalization, had no complaints of chest pain, shortness of breath beyond his baseline, fever or chills. He he follows up with hospice as outpatient.  We are unable to wean him off of BiPAP.  Pulmonary consulted as they recommend hospice/comfort measures.  Discussed this with wife.  Palliative and hospice was consulted.  On bipap  bp 111/81 Somnolent Increase expriatory time, no wheezing Rrr, s1/s2 Soft benign +bs  A/p Will remain on bipap Palliative/hospice on board Family will return tonight to transition to comfort measures
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
## 2020-11-14 NOTE — Progress Notes (Signed)
PHARMACY CONSULT NOTE  Pharmacy Consult for Electrolyte Monitoring and Replacement   Recent Labs: Potassium (mmol/L)  Date Value  11/13/2020 4.8   Magnesium (mg/dL)  Date Value  11/14/2020 1.7   Calcium (mg/dL)  Date Value  11/13/2020 8.8 (L)   Albumin (g/dL)  Date Value  11/13/2020 3.7   Phosphorus (mg/dL)  Date Value  11/14/2020 4.1   Sodium (mmol/L)  Date Value  11/13/2020 138   Corrected Ca: 9.04 mg/dL  Assessment: 70 y.o. male with medical history significant for CAD, alcohol use disorder, HTN, prostate cancer, A. fib on apixaban, COPD on home O2 at 4 L, narcolepsy and OSA s/p upper airway surgerynot on CPAP, recently hospitalized from 2/21-2/24 with COPD exacerbation and rapid A. fib requiring diltiazem who was brought in by EMS in severe respiratory distress  Goal of Therapy:  Potassium 4.0 - 5.1 mmol/L Magnesium 2.0 - 2.4 mg/dL All Other Electrolytes WNL  Plan:   2 grams IV magnesium sulfate x 1  Re-check electrolytes in am  Dallie Piles ,PharmD Clinical Pharmacist 11/14/2020 8:09 AM
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
## 2020-11-14 NOTE — Progress Notes (Addendum)
[place] Room IC19 Manufacturing engineer [facility]) Hospital Liaison Hospitalized Hospice patient RN note:  Robert Powell is a current hospice patient with a terminal diagnosis of COPD. His spouse activated EMS after the power went out at their house and patient complained of respiratory distress due to COPD exacerbation. Patient was placed on Bipap in the emergency room and treated with a diltiazem bolus and IV Lasix. He was somnolent on arrival but aroused with treatment. Family did not contact hospice before patient was transported to the hospital. Patient was admitted to [facility] on 03.08.22 at 2:22am with a diagnosis of respiratory failure with hypoxia and hypercapnia. Per Dr. Gilford Rile with [facility], this is a related hospital admission. Patient is a DNR.  Visited with patient and spouse, Vickie in room. Patient was somnolent and unable to arouse. He remains on Bipap for O2 support. Spoke with spouse regarding plan. She is unsure at this time what she would want done. Discussed via phone with palliative NP and she will be seeing patient and spouse to discuss [place].  Vital Signs: BP 111/81, HR 88, Resp 25, Temp 97.1 ax, O2 sat 92% on Bipap 40% rate of 14.  I&O: 163ml/350ml  Abnormal labs: Chloride: 95 (L) CO2: 34 (H) Glucose: 140 (H) Calcium: 8.8 (L) B Natriuretic Peptide: 405.4 (H) WBC: 18.2 (H) Hemoglobin: 12.8 (L) MCHC: 29.4 (L) NEUT#: 14.9 (H)  Diagnostics:  CXR IMPRESSION: Interval development of mild cardiogenic failure.  IV/PRN Meds: LORazepam (ATIVAN) injection 0-4 mg Dose: 0-4 mg Freq: Every 12 hours Route: IV Start: 11/16/20 0245 End: 11/18/20 0244  thiamine (B-1) injection 100 mg Dose: 100 mg Freq: Daily Route: IV Start: 11/14/20 1000  diltiazem (CARDIZEM) 125 mg in dextrose 5% 125 mL (1 mg/mL) infusion Rate: 5-15 mL/hr Dose: 5-15 mg/hr Freq: Continuous Route: IV Last Dose: 10 mg/hr (11/14/20 1354) Start: 11/14/20 0114  furosemide (LASIX) injection 40 mg Dose:  40 mg Freq: Once Route: IV Start: 11/13/20 2345 End: 11/14/20   magnesium sulfate IVPB 2 g 50 mL Dose: 2 g Freq: Once Route: IV Last Dose: Stopped (11/14/20 1056) Start: 11/14/20 1000 End: 11/14/20 1056  Problem List: Acute on chronic respiratory failure with hypoxia and hypercapnia    Respiratory acidosis   COPD with chronic bronchitis   OSA status post upper airway surgery/?  Narcolepsy/hypersomnolence -Patient presenting with acute respiratory distress after reportedly not having oxygen backup after losing power at the house -VBG with respiratory acidosis of pH 7.21 and PCO2 as high as 119 -Patient was not definitely wheezing so exacerbation of COPD not suspected -At baseline patient oxygen dependent at 3 to 4 L -Continue BiPAP and wean as tolerated -Duo nebs as needed and home inhalers -Patient was evaluated by pulmonary, Dr. Raul Del during recent past hospitalization with concern for narcolepsy/hypersomnolence.  He declined a prescription for Provigil -Also has OSA status post upper airway surgery and not on CPAP.    Acute metabolic encephalopathy -Likely secondary to CO2 narcosis with PCO2 119 on arrival -Continue BiPAP -Aspiration precautions -Avoid sedating meds.  Patient noted to be on Ativan twice daily for anxiety    Atrial fibrillation with rapid ventricular response (HCC)   Chronic anticoagulation -Continue diltiazem infusion started from the emergency room -Continue Eliquis for stroke prevention    Adenocarcinoma of prostate ([place]) -No acute issues    Essential (primary) hypertension -On diltiazem infusion    Alcohol use disorder, moderate, dependence (HCC) -CIWA withdrawal protocol    CAD (coronary artery disease) -Continue clopidogrel, rosuvastatin  Discharge Planning:  Ongoing  Family Contact: Spoke with wife, Vickie at bedside. Discussed patient's condition and what he would like to have done considering he is a hospice patient. She is unsure.  Palliative NP will be seeing to help with GOC.  IDG: Updated  Goals of Care: not clear. Palliative care consulted.  Medication list and Transfer Summary placed on Shadow Chart.  Please call with any hospice related questions or concerns.  Zandra Abts, RN [facility] Liaison [phone]

## 2020-11-14 NOTE — Consult Note (Signed)
Consultation Note Date: 11/14/2020   Patient Name: Robert Powell  DOB: 1951/02/20  MRN: [id number]  Age / Sex: 70 y.o., male  PCP: Pcp, No Referring Physician: Nolberto Hanlon, MD  Reason for Consultation: Establishing goals of care  HPI/Patient Profile: DAWUD MAYS is a 70 y.o. male with medical history significant for CAD, alcohol use disorder, HTN, prostate cancer, A. fib on apixaban, COPD on home O2 at 4 L, narcolepsy and OSA s/p upper airway surgerynot on CPAP, recently hospitalized from 2/21-2/24 with COPD exacerbation and rapid A. fib requiring diltiazem who was brought in by EMS in severe respiratory distress, that started after power went out at home. Patient is a current hospice at home patient.  Clinical Assessment and Goals of Care: Patient is sitting in bed on BIPAP. He is pulling at the mask. He does not open eyes.   Spoke with daughter and wife via phone. They discuss that he called hospice himself to initiate care after experiencing extreme SOB with walking in the home. They state he began using morphine which helped his SOB until last night. Last night he became very anxious with loss of power and came to the hospital.   They state they do not want him to suffer, and want him to be comfortable for what time he has left. Patient is BIPAP dependent at this time. Family would like to come in and spend time with him, and then liberate him from the BIPAP mask with medications for his comfort. They plan to return with other family this evening around 7-8pm.       SUMMARY OF RECOMMENDATIONS    Shift to comfort care this evening when family returns. Will needs infusion with boluses placed for dyspnea and pain.   Prognosis:   Hours - Days  Discharge Planning: Anticipated Hospital Death      Primary Diagnoses: Present on Admission:  Acute on chronic respiratory failure with hypoxia and  hypercapnia (HCC)  CAD (coronary artery disease)  Atrial fibrillation with rapid ventricular response (HCC)  Essential (primary) hypertension  Adenocarcinoma of prostate ([place])   I have reviewed the medical record, interviewed the patient and family, and examined the patient. The following aspects are pertinent.  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   COPD (chronic obstructive pulmonary disease) (HCC)    Elevated prostate specific antigen (PSA) 02/16/2015   Elevated PSA    6.7 on 02/16/2015   Essential (primary) hypertension 02/18/2015   Genital herpes    Hyperlipidemia    Hypertension    Lesion of external ear 04/28/2014   Narcolepsy    Rhinitis    Sleep apnea  histort had surgery better   Tobacco abuse    Social History   Socioeconomic History   Marital status: Married    Spouse name: Not on file   Number of children: Not on file   Years of education: Not on file   Highest education level: Not on file  Occupational History   Not on file  Tobacco Use   Smoking status: Current Every Day Smoker    Packs/day: 1.50   Smokeless tobacco: Never Used  Substance and Sexual Activity   Alcohol use: Yes    Alcohol/week: 8.0 standard drinks    Types: 8 Glasses of wine per week    Comment: occ   Drug use: No   Sexual activity: Not on file  Other Topics Concern   Not on file  Social History Narrative   Not on file   Social Determinants of Health   Financial Resource Strain: Not on file  Food Insecurity: Not on file  Transportation Needs: Not on file  Physical  Activity: Not on file  Stress: Not on file  Social Connections: Not on file   Family History  Problem Relation Age of Onset   Heart attack Mother    Heart disease Mother    Heart attack Father    Kidney disease Neg Hx    Prostate cancer Neg Hx    Kidney cancer Neg Hx    Bladder Cancer Neg Hx    Scheduled Meds:  vitamin C  250 mg Oral BID   Chlorhexidine Gluconate Cloth  6 each Topical Daily   enoxaparin (LOVENOX) injection  1 mg/kg Subcutaneous L38B   folic acid  1 mg Oral Daily   LORazepam  0-4 mg Intravenous Q6H   Followed by   Derrill Memo ON 11/16/2020] LORazepam  0-4 mg Intravenous Q12H   multivitamin with minerals  1 tablet Oral Daily   Ensure Max Protein  11 oz Oral BID   thiamine  100 mg Oral Daily   Or   thiamine  100 mg Intravenous Daily   Continuous Infusions:  diltiazem (CARDIZEM) infusion 10 mg/hr (11/14/20 1400)   diltiazem (CARDIZEM) infusion     PRN Meds:.acetaminophen, LORazepam **OR** LORazepam, ondansetron (ZOFRAN) IV Medications Prior to Admission:  Prior to Admission medications   Medication Sig Start Date End Date Taking? Authorizing Provider  albuterol (PROVENTIL) (2.5 MG/3ML) 0.083% nebulizer solution Take 3 mLs by nebulization in the morning, at noon, in the evening, and at bedtime. 09/21/20   [provider]  albuterol (VENTOLIN HFA) 108 (90 Base) MCG/ACT inhaler Inhale 2 puffs into the lungs every 4 (four) hours as needed for wheezing or shortness of breath.    [provider]  apixaban (ELIQUIS) 5 MG TABS tablet Take 1 tablet (5 mg total) by mouth 2 (two) times daily. 11/02/20   Lorella Nimrod, MD  azithromycin (ZITHROMAX) 250 MG tablet Take 1 tablet daily for next 3 days 11/02/20   Lorella Nimrod, MD  calcium-vitamin D (OSCAL WITH D) 500-200 MG-UNIT tablet Take 1 tablet by mouth daily. 11/02/20   Lorella Nimrod, MD  cholecalciferol (VITAMIN D3) 25 MCG (1000 UNIT) tablet Take 1,000 Units by mouth daily.    [provider]  clopidogrel (PLAVIX) 75 MG tablet Take 1 tablet (75 mg total) by mouth daily with breakfast. 11/02/20   Lorella Nimrod, MD  diltiazem (CARDIZEM CD) 180 MG 24 hr capsule Take 1 capsule (180 mg total) by mouth daily. 11/03/20   Lorella Nimrod, MD  docusate sodium (COLACE)  100 MG capsule Take 1 capsule (100 mg total) by mouth 2 (two) times daily. 11/02/20   Lorella Nimrod, MD  LORazepam (ATIVAN) 0.5 MG tablet Take 0.5 mg by mouth 2 (two) times daily as needed for anxiety. 10/06/20   [provider]  rosuvastatin (CRESTOR) 10 MG tablet Take 10 mg by mouth daily.    [provider]  tiotropium (SPIRIVA) 18 MCG inhalation capsule Place 18 mcg into inhaler and inhale daily.    [provider]   No Known Allergies Review of Systems  Unable to perform ROS   Physical Exam Constitutional:      Comments: Eyes closed. Trying to remove BIPAP mask.     Vital Signs: BP 113/78    Pulse 82    Temp (!) 96.8 F (36 C) (Axillary)    Resp (!) 24    Ht 6' (1.829 m)    Wt 127 kg    SpO2 95%    BMI 37.97 kg/m  Pain Scale: CPOT   Pain Score: 6    SpO2: SpO2: 95 % O2 Device:SpO2: 95 % O2 Flow Rate: .   IO: Intake/output summary:   Intake/Output Summary (Last 24 hours) at 11/14/2020 1606 Last data filed at 11/14/2020 1400 Gross per 24 hour  Intake 164.18 ml  Output 1750 ml  Net -1585.82 ml    LBM: Last BM Date:  (PTA) Baseline Weight: Weight: 127 kg Most recent weight: Weight: 127 kg       Time In: 3:35 Time Out: 4:10 Time Total: 35 min Greater than 50%  of this time was spent counseling and coordinating care related to the above assessment and plan.  Signed by: Asencion Gowda, NP   Please contact Palliative Medicine Team phone at [phone] for questions and concerns.  For individual provider: See Shea Evans

## 2020-11-14 NOTE — Plan of Care (Signed)
Neuro: intermittently follows commands, becoming more difficult to stimulate over the course of the day, will not open eyes spontaneously/by request by the end of the day Resp: stable on Bipap, RT trial of placing patient on 6L [place], patient failed with desat into the mid 80s, placed back on bipap-O2 sats stable in the low 90s throughout the day CV: afebrile, multiple arrhythmias and vital signs downtrending throughout that day GIGU: NPO, no BM, voiding per external catheter Skin: clean dry/flaky and intact, Social: Wife at the bedside during the day, update given, all questions and concerns addressed.  Events: Family spoke with Crystal NP with palliative care, plan for this evening to transition to comfort care when family arrives at bedside.    Problem: Education: Goal: Knowledge of General Education information will improve Description: Including pain rating scale, medication(s)/side effects and non-pharmacologic comfort measures Outcome: Not Progressing   Problem: Health Behavior/Discharge Planning: Goal: Ability to manage health-related needs will improve Outcome: Not Progressing   Problem: Clinical Measurements: Goal: Ability to maintain clinical measurements within normal limits will improve Outcome: Not Progressing Goal: Will remain free from infection Outcome: Not Progressing Goal: Diagnostic test results will improve Outcome: Not Progressing Goal: Respiratory complications will improve Outcome: Not Progressing Goal: Cardiovascular complication will be avoided Outcome: Not Progressing   Problem: Activity: Goal: Risk for activity intolerance will decrease Outcome: Not Progressing   Problem: Nutrition: Goal: Adequate nutrition will be maintained Outcome: Not Progressing   Problem: Coping: Goal: Level of anxiety will decrease Outcome: Not Progressing   Problem: Elimination: Goal: Will not experience complications related to bowel motility Outcome: Not  Progressing Goal: Will not experience complications related to urinary retention Outcome: Not Progressing   Problem: Pain Managment: Goal: General experience of comfort will improve Outcome: Not Progressing   Problem: Safety: Goal: Ability to remain free from injury will improve Outcome: Not Progressing   Problem: Skin Integrity: Goal: Risk for impaired skin integrity will decrease Outcome: Not Progressing

## 2020-11-14 NOTE — Progress Notes (Addendum)
SLP Cancellation Note  Patient Details Name: Robert Powell MRN: [id number] DOB: 1950/12/07   Cancelled treatment:       Reason Eval/Treat Not Completed: Medical issues which prohibited therapy;Patient not medically ready (chart reviewed; consulted NSG re: pt's status) Pt is currently on continuous BIPAP for O2 support; somnolent per NSG. Elevated CO2 at admit; a-fib. Pt is on Hospice services Baseline at home, per NSG. Noted pt's PMH.  ST services will f/u tomorrow w/ pt's status and appropriateness for BSE. Recommend frequent oral care for hygiene and stimulation of swallowing while NPO.     Orinda Kenner, MS, CCC-SLP Speech Language Pathologist Rehab Services [phone] [facility] 11/14/2020, 11:58 AM

## 2020-11-15 DIAGNOSIS — L8990 Pressure ulcer of unspecified site, unspecified stage: Secondary | ICD-10-CM | POA: Insufficient documentation

## 2020-11-15 LAB — BASIC METABOLIC PANEL
Anion gap: 10 (ref 5–15)
BUN: 33 mg/dL — ABNORMAL HIGH (ref 8–23)
CO2: 38 mmol/L — ABNORMAL HIGH (ref 22–32)
Calcium: 8.7 mg/dL — ABNORMAL LOW (ref 8.9–10.3)
Chloride: 90 mmol/L — ABNORMAL LOW (ref 98–111)
Creatinine, Ser: 1.02 mg/dL (ref 0.61–1.24)
GFR, Estimated: >60 mL/min (ref >60)
Glucose, Bld: 133 mg/dL — ABNORMAL HIGH (ref 70–99)
Potassium: 4.6 mmol/L (ref 3.5–5.1)
Sodium: 138 mmol/L (ref 135–145)

## 2020-11-15 LAB — MAGNESIUM: Magnesium: 2.1 mg/dL (ref 1.7–2.4)

## 2020-11-15 MED ORDER — ONDANSETRON 4 MG PO TBDP
4.0000 mg | ORAL_TABLET | Freq: Four times a day (QID) | ORAL | Status: DC | PRN
  Filled 2020-11-15: qty 1

## 2020-11-15 MED ORDER — METOPROLOL TARTRATE 5 MG/5ML IV SOLN
5.0000 mg | Freq: Once | INTRAVENOUS | Status: AC
  Administered 2020-11-15: 5 mg via INTRAVENOUS
  Filled 2020-11-15: qty 5

## 2020-11-15 MED ORDER — GLYCOPYRROLATE 1 MG PO TABS
1.0000 mg | ORAL_TABLET | ORAL | Status: DC | PRN
  Filled 2020-11-15: qty 1

## 2020-11-15 MED ORDER — SODIUM CHLORIDE 0.9% FLUSH: 3.0000 mL | Freq: Two times a day (BID) | INTRAVENOUS | Status: DC

## 2020-11-15 MED ORDER — LORAZEPAM 2 MG/ML IJ SOLN
1.0000 mg | INTRAMUSCULAR | Status: DC | PRN
  Administered 2020-11-15: 1 mg via INTRAVENOUS
  Filled 2020-11-15: qty 1

## 2020-11-15 MED ORDER — ACETAMINOPHEN 325 MG PO TABS: 650.0000 mg | ORAL_TABLET | Freq: Four times a day (QID) | ORAL | Status: DC | PRN

## 2020-11-15 MED ORDER — LORAZEPAM 2 MG/ML PO CONC: 1.0000 mg | ORAL | Status: DC | PRN

## 2020-11-15 MED ORDER — ROSUVASTATIN CALCIUM 10 MG PO TABS
10.0000 mg | ORAL_TABLET | Freq: Every day | ORAL | Status: DC
  Filled 2020-11-15: qty 1

## 2020-11-15 MED ORDER — GLYCOPYRROLATE 0.2 MG/ML IJ SOLN
0.2000 mg | INTRAMUSCULAR | Status: DC | PRN
  Administered 2020-11-15: 0.2 mg via INTRAVENOUS
  Filled 2020-11-15: qty 1

## 2020-11-15 MED ORDER — ACETAMINOPHEN 650 MG RE SUPP: 650.0000 mg | Freq: Four times a day (QID) | RECTAL | Status: DC | PRN

## 2020-11-15 MED ORDER — CALCIUM CARBONATE-VITAMIN D 500-200 MG-UNIT PO TABS
1.0000 | ORAL_TABLET | Freq: Every day | ORAL | Status: DC
  Filled 2020-11-15: qty 1

## 2020-11-15 MED ORDER — HALOPERIDOL LACTATE 2 MG/ML PO CONC
0.5000 mg | ORAL | Status: DC | PRN
  Filled 2020-11-15: qty 0.3

## 2020-11-15 MED ORDER — POLYVINYL ALCOHOL 1.4 % OP SOLN
1.0000 [drp] | Freq: Four times a day (QID) | OPHTHALMIC | Status: DC | PRN
Start: 2020-11-15 — End: 2020-11-15
  Filled 2020-11-15: qty 15

## 2020-11-15 MED ORDER — MORPHINE SULFATE (CONCENTRATE) 10 MG/0.5ML PO SOLN: 5.0000 mg | ORAL | 0 refills | Status: AC | PRN

## 2020-11-15 MED ORDER — MORPHINE BOLUS VIA INFUSION
1.0000 mg | INTRAVENOUS | Status: DC | PRN
  Filled 2020-11-15: qty 1

## 2020-11-15 MED ORDER — MORPHINE SULFATE (PF) 2 MG/ML IV SOLN
2.0000 mg | INTRAVENOUS | Status: DC | PRN
  Administered 2020-11-15: 2 mg via INTRAVENOUS
  Administered 2020-11-15: 3 mg via INTRAVENOUS
  Administered 2020-11-15: 4 mg via INTRAVENOUS
  Filled 2020-11-15 (×2): qty 2
  Filled 2020-11-15: qty 1

## 2020-11-15 MED ORDER — GLYCOPYRROLATE 0.2 MG/ML IJ SOLN: 0.2000 mg | INTRAMUSCULAR | Status: DC | PRN

## 2020-11-15 MED ORDER — MORPHINE 100MG IN NS 100ML (1MG/ML) PREMIX INFUSION
1.0000 mg/h | INTRAVENOUS | Status: DC
Start: 2020-11-15 — End: 2020-11-15

## 2020-11-15 MED ORDER — ONDANSETRON HCL 4 MG/2ML IJ SOLN: 4.0000 mg | Freq: Four times a day (QID) | INTRAMUSCULAR | Status: DC | PRN

## 2020-11-15 MED ORDER — SODIUM CHLORIDE 0.9% FLUSH: 3.0000 mL | INTRAVENOUS | Status: DC | PRN

## 2020-11-15 MED ORDER — LORAZEPAM 1 MG PO TABS: 0.5000 mg | ORAL_TABLET | Freq: Two times a day (BID) | ORAL | Status: DC | PRN

## 2020-11-15 MED ORDER — HALOPERIDOL LACTATE 5 MG/ML IJ SOLN: 0.5000 mg | INTRAMUSCULAR | Status: DC | PRN

## 2020-11-15 MED ORDER — GLYCOPYRROLATE 1 MG PO TABS: 1.0000 mg | ORAL_TABLET | ORAL | 0 refills | Status: AC | PRN

## 2020-11-15 MED ORDER — CLOPIDOGREL BISULFATE 75 MG PO TABS: 75.0000 mg | ORAL_TABLET | Freq: Every day | ORAL | Status: DC

## 2020-11-15 MED ORDER — VITAMIN D 25 MCG (1000 UNIT) PO TABS
1000.0000 [IU] | ORAL_TABLET | Freq: Every day | ORAL | Status: DC
  Filled 2020-11-15: qty 1

## 2020-11-15 MED ORDER — APIXABAN 5 MG PO TABS
5.0000 mg | ORAL_TABLET | Freq: Two times a day (BID) | ORAL | Status: DC
  Filled 2020-11-15: qty 1

## 2020-11-15 MED ORDER — HALOPERIDOL 0.5 MG PO TABS
0.5000 mg | ORAL_TABLET | ORAL | Status: DC | PRN
  Filled 2020-11-15: qty 1

## 2020-11-15 MED ORDER — BIOTENE DRY MOUTH MT LIQD: 15.0000 mL | OROMUCOSAL | Status: DC | PRN

## 2020-11-15 MED ORDER — LORAZEPAM 2 MG/ML PO CONC: 1.0000 mg | ORAL | 0 refills | Status: AC | PRN

## 2020-11-15 MED ORDER — TIOTROPIUM BROMIDE MONOHYDRATE 18 MCG IN CAPS
18.0000 ug | ORAL_CAPSULE | Freq: Every day | RESPIRATORY_TRACT | Status: DC
  Filled 2020-11-15: qty 5

## 2020-11-15 MED ORDER — HALOPERIDOL 0.5 MG PO TABS: 0.5000 mg | ORAL_TABLET | ORAL | 0 refills | Status: AC | PRN

## 2020-11-15 MED ORDER — SODIUM CHLORIDE 0.9 % IV SOLN: 250.0000 mL | INTRAVENOUS | Status: DC | PRN

## 2020-11-15 MED ORDER — DILTIAZEM HCL ER COATED BEADS 180 MG PO CP24
180.0000 mg | ORAL_CAPSULE | Freq: Every day | ORAL | Status: DC
  Administered 2020-11-15: 180 mg via ORAL
  Filled 2020-11-15: qty 1

## 2020-11-15 MED ORDER — LORAZEPAM 1 MG PO TABS: 1.0000 mg | ORAL_TABLET | ORAL | Status: DC | PRN

## 2020-11-15 MED ORDER — MORPHINE SULFATE (CONCENTRATE) 10 MG/0.5ML PO SOLN: 5.0000 mg | ORAL | Status: DC | PRN

## 2020-11-15 NOTE — Evaluation (Addendum)
Clinical/Bedside Swallow Evaluation Patient Details  Name: Robert Powell MRN: [id number] Date of Birth: June 30, 1951  Today's Date: 11/15/2020 Time: SLP Start Time (ACUTE ONLY): 0845 SLP Stop Time (ACUTE ONLY): 0945 SLP Time Calculation (min) (ACUTE ONLY): 60 min  Past Medical History:  Past Medical History:  Diagnosis Date  . AA (alcohol abuse) 02/28/2014  . Adenocarcinoma of prostate ([place]) 05/22/2015  . Alcohol abuse   . Apnea, sleep 02/28/2014  . Benign essential HTN 02/17/2015  . 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 Cancer ([place])   . Cellulitis 09/21/2014  . Chronic obstructive pulmonary disease ([place]) 04/28/2014  . 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 COPD (chronic obstructive pulmonary disease) ([place])   . Elevated prostate specific antigen (PSA) 02/16/2015  . Elevated PSA    6.7 on 02/16/2015  . Essential (primary) hypertension 02/18/2015  . Genital herpes   . Hyperlipidemia   . Hypertension   . Lesion of external ear 04/28/2014  . Narcolepsy   . Rhinitis   . Sleep apnea    histort had surgery better  . Tobacco abuse    Past Surgical History:  Past Surgical History:  Procedure Laterality Date  . CARDIAC CATHETERIZATION Left 05/23/2016   Procedure: Left Heart Cath and Coronary Angiography;  Surgeon: Corey Skains, MD;  Location: [place] [facility];  Service: Cardiovascular;  Laterality: Left;  . CARDIAC CATHETERIZATION N/A 05/23/2016   Procedure: Coronary Stent Intervention;  Surgeon: Isaias Cowman, MD;  Location: [place] [facility];  Service: Cardiovascular;  Laterality: N/A;  . RADIOACTIVE SEED IMPLANT N/A 11/20/2015   Procedure: RADIOACTIVE SEED IMPLANT/BRACHYTHERAPY IMPLANT;  Surgeon: Hollice Espy, MD;  Location: [facility] ORS;  Service: Urology;  Laterality: N/A;  . TONSILLECTOMY     HPI:  Pt is a 70 y.o. male with medical history significant for CAD, alcohol use disorder, HTN, prostate cancer, A. fib on apixaban,  COPD on home O2 at 4 L, narcolepsy and OSA s/p upper airway surgerynot on CPAP, recently hospitalized from 2/21-2/24 with COPD exacerbation and rapid A. fib requiring diltiazem who was brought in by EMS in severe respiratory distress. Pt is followed by Hospice services at home.  CXR: Interval development of mild cardiogenic failure; small bilateral pleural effusions. Pt had initially been on BIPAP since admit; removed last night per POC, w/ Palliative Care following.   Assessment / Plan / Recommendation Clinical Impression  Pt appears to present w/ grossly adequate oropharyngeal phase swallow function w/ No oropharyngeal phase dysphagia noted, no neuromuscular weakness noted. Pt consumed po trials w/ No immediate, overt clinical s/s of aspiration during po trials. However, pt does have declined Pulmonary status baseline and is quick to have WOB w/ ANY physical exertion including talking. Pt required rest breaks for conservation of energy. Pt appears at reduced risk for aspiration following general aspiration precautions, monitoring tough/chewy textures of foods and avoiding them, and assisted at meals w/ feeding to conserve energy. During po trials, pt consumed all consistencies w/ no overt coughing, decline in vocal quality, or sustained change in respiratory presentation during/post trials. O2 sats remained 92-93%(baseline). Rest breaks given to lessen WOB when increased. Oral phase appeared grossly [facility] w/ timely bolus management and control of bolus propulsion for A-P transfer for swallowing. Min increased Time needed for mastication of tougher textured trials. Given Time, oral clearing achieved w/ all trial consistencies. Rest breaks given b/t trials. OM Exam appeared [facility] w/ no unilateral weakness noted. Speech Clear. Pt fed self holding  Cup to drink but supported w/ foods. Recommend a mech soft consistency diet w/ well-Cut meats, moistened foods for conservation of energy; Thin liquids VIA CUP . Recommend  general aspiration precautions, Rest Breaks during meals to lessen WOB; Pills WHOLE in Puree for safer, easier swallowing. Education given on Pills in Puree; food consistencies and easy to eat options; general aspiration precautions. NSG agreed. SLP Visit Diagnosis: Dysphagia, unspecified (R13.10)    Aspiration Risk  Mild aspiration risk;Risk for inadequate nutrition/hydration (reduced following precautions)    Diet Recommendation  Mech Soft diet w/ well-cut meats/foods w/ gravies added to moisten for conservation of energy; Thin liquids via CUP ONLY.  General aspiration precautions. Assistance at meals. Rest breaks during meals.  Medication Administration: Whole meds with puree (for safer swallowing)    Other  Recommendations Recommended Consults:  (Dietician f/u) Oral Care Recommendations: Oral care BID;Oral care before and after PO;Staff/trained caregiver to provide oral care Other Recommendations:  (n/a)   Follow up Recommendations None (TBD)      Frequency and Duration min 2x/week  1 week       Prognosis Prognosis for Safe Diet Advancement: Fair Barriers to Reach Goals: Severity of deficits;Time post onset (Pulmonary)      Swallow Study   General Date of Onset: 11/13/20 HPI: Pt is a 70 y.o. male with medical history significant for CAD, alcohol use disorder, HTN, prostate cancer, A. fib on apixaban, COPD on home O2 at 4 L, narcolepsy and OSA s/p upper airway surgerynot on CPAP, recently hospitalized from 2/21-2/24 with COPD exacerbation and rapid A. fib requiring diltiazem who was brought in by EMS in severe respiratory distress. Pt is followed by Hospice services at home.  CXR: Interval development of mild cardiogenic failure; small bilateral pleural effusions. Pt had initially been on BIPAP since admit; removed last night per POC, w/ Palliative Care following. Type of Study: Bedside Swallow Evaluation Previous Swallow Assessment: none Diet Prior to this Study: NPO Temperature  Spikes Noted: No (wbc 18.2 at admit) Respiratory Status: Nasal cannula (4L - baseline at home also) History of Recent Intubation: No Behavior/Cognition: Alert;Cooperative;Pleasant mood;Distractible;Requires cueing (easily SOB) Oral Cavity Assessment: Dry Oral Care Completed by SLP: Yes Oral Cavity - Dentition: Missing dentition Vision: Functional for self-feeding Self-Feeding Abilities: Able to feed self;Needs assist;Needs set up;Total assist (shaky UEs; weakness) Patient Positioning: Upright in bed (upright positioning supported) Baseline Vocal Quality: Normal (adequate) Volitional Cough: Strong Volitional Swallow: Able to elicit    Oral/Motor/Sensory Function Overall Oral Motor/Sensory Function: Within functional limits   Ice Chips Ice chips: Within functional limits Presentation: Spoon (fed; 3 trials)   Thin Liquid Thin Liquid: Within functional limits (grossly) Presentation: Cup;Self Fed (supported; ~4 ozs total)    Nectar Thick Nectar Thick Liquid: Not tested   Honey Thick Honey Thick Liquid: Not tested   Puree Puree: Within functional limits Presentation: Spoon (fed; 8 trials)   Solid     Solid: Impaired (min) Presentation: Spoon (fed; 8 trials) Oral Phase Impairments: Impaired mastication (missing dentition; inc'd WOB w/ exertion) Pharyngeal Phase Impairments:  (none)       Orinda Kenner, MS, Camera operator Rehab Services [phone] Mose Colaizzi 11/15/2020,3:31 PM

## 2020-11-15 NOTE — TOC Transition Note (Signed)
Transition of Care [facility]) - CM/SW Discharge Note   Patient Details  Name: Robert Powell MRN: [id number] Date of Birth: [birth year]  [facility] of [facility]) CM/SW Contact:  Ova Freshwater Phone Number: [phone] 11/15/2020, 1:25 PM   Clinical Narrative:     Patient will d/c to [place] Room #1. Attending/ ICU RN updated.  Paperwork given to ICU RN.  [facility] will transport at 5:00PM.         Patient Goals and CMS Choice        Discharge Placement                       Discharge Plan and Services                                     Social Determinants of Health (SDOH) Interventions     Readmission Risk Interventions No flowsheet data found.

## 2020-11-15 NOTE — Progress Notes (Signed)
[place] Room IC19 [facility]) Hospital Liaison RN note:  [facility] will have a room to offer this afternoon. Spoke with daughter, Donella Stade over the phone to confirm interest and explain services. She verbalized understanding. She and spouse, Loletha Carrow will be at the [place] to sign consents at 12 noon. Transportation can be arranged for 5 pm.   Please call with any hospice related questions or concerns.  Thank you for the opportunity to participate in this patient's care.  Zandra Abts, RN [facility] Liaison [phone]

## 2020-11-15 NOTE — Progress Notes (Signed)
SLP Cancellation Note  Patient Details Name: Robert Powell MRN: [id number] DOB: 10-Jun-1951   Cancelled treatment:       Reason Eval/Treat Not Completed:  (chart reviewed). Per chart review this PM, pt has transitioned to comfort care per MD note and will transfer to the Hospice Home. Also per order history, his mech soft diet recommended was discontinued by Palliative Care services, and pt was placed on a Regular diet. Continue to recommend following general aspiration precautions and support pt w/ all oral intake. Frequent oral care for hygiene. MD to reconsult ST services for any further needs.     Orinda Kenner, MS, CCC-SLP Speech Language Pathologist Rehab Services [phone] [facility] 11/15/2020, 5:46 PM

## 2020-11-15 NOTE — Plan of Care (Signed)
Neuro: Patient's neuro status continues to wax and wain throughout the day, can intermittently answer questions and interact but has decreased over the shift, He has required multiple doses of ativan and morphine to assist in anxiety and air hunger management Resp: tolerating 4-6L [place] throughout the day, congested cough, MS04 given for air hunger, patient has often been tachypneic while shallow breathing CV: afebrile, a fib returned today-Metoprolol x 1 to decrease HR with positive results, Cardizem IV D/C'd  GIGU: external catheter in place, drinking water well, no BM, no emesis Skin: dry/flaky/intact Social: Wife and Daughter came to visit today. All questions and concerns addressed. Full update given. While they were both tearful at times, they were supportive of the patient and expressed understanding to what the care plan is and will be.  Both feel moving the patient to the hospice facility is the best option for him.   Events: patient transferring to hospice facility for end of life care, report called to the hospice house RN [facility] left with the patient approximately 1820. Daughter Crystal called and notified of his departure from [facility] ICU approximately 1825  Problem: Education: Goal: Knowledge of General Education information will improve Description: Including pain rating scale, medication(s)/side effects and non-pharmacologic comfort measures Outcome: Adequate for Discharge   Problem: Health Behavior/Discharge Planning: Goal: Ability to manage health-related needs will improve Outcome: Adequate for Discharge   Problem: Clinical Measurements: Goal: Ability to maintain clinical measurements within normal limits will improve Outcome: Adequate for Discharge Goal: Will remain free from infection Outcome: Adequate for Discharge Goal: Diagnostic test results will improve Outcome: Adequate for Discharge Goal: Respiratory complications will improve Outcome: Adequate for  Discharge Goal: Cardiovascular complication will be avoided Outcome: Adequate for Discharge   Problem: Activity: Goal: Risk for activity intolerance will decrease Outcome: Adequate for Discharge   Problem: Nutrition: Goal: Adequate nutrition will be maintained Outcome: Adequate for Discharge   Problem: Coping: Goal: Level of anxiety will decrease Outcome: Adequate for Discharge   Problem: Elimination: Goal: Will not experience complications related to bowel motility Outcome: Adequate for Discharge Goal: Will not experience complications related to urinary retention Outcome: Adequate for Discharge   Problem: Pain Managment: Goal: General experience of comfort will improve Outcome: Adequate for Discharge   Problem: Safety: Goal: Ability to remain free from injury will improve Outcome: Adequate for Discharge   Problem: Skin Integrity: Goal: Risk for impaired skin integrity will decrease Outcome: Adequate for Discharge   Problem: Education: Goal: Knowledge of the prescribed therapeutic regimen will improve Outcome: Adequate for Discharge   Problem: Coping: Goal: Ability to identify and develop effective coping behavior will improve Outcome: Adequate for Discharge   Problem: Clinical Measurements: Goal: Quality of life will improve Outcome: Adequate for Discharge   Problem: Respiratory: Goal: Verbalizations of increased ease of respirations will increase Outcome: Adequate for Discharge   Problem: Role Relationship: Goal: Family's ability to cope with current situation will improve Outcome: Adequate for Discharge Goal: Ability to verbalize concerns, feelings, and thoughts to partner or family member will improve Outcome: Adequate for Discharge   Problem: Pain Management: Goal: Satisfaction with pain management regimen will improve Outcome: Adequate for Discharge

## 2020-11-15 NOTE — Progress Notes (Signed)
Patient's neuro status is improved from yesterday but is still greatly decreased.  He is answering questions but has significant aphasia. He has delayed responses and has been having trouble finding his words.  He is short of breath while speaking, gets easily distracted, and has trouble focusing his vision. He expressed that he has anxiety and has asked about his hospice care. He stated he is at the end of his life and is ready to be done. He recognizes that he is struggling, he is tired and he is having some confused moments. Will continue to monitor and support the patient's symptoms.

## 2020-11-15 NOTE — Progress Notes (Addendum)
Daily Progress Note   Patient Name: Robert Powell       Date: 11/15/2020 DOB: 1951-03-26  Age: 70 y.o. MRN#: [id number] Attending Physician: Fritzi Mandes, MD Primary Care Physician: Baxter Hire, MD Admit Date: 11/13/2020  Reason for Consultation/Follow-up: Establishing goals of care  Subjective: Patient is resting in bed. He is sleepy but conversive, and speaking in broken sentences attempting to catch his breath. He has been diuresed and has been removed from BIPAP.  He tells me "I'm tired. I'm at the end of the rope, and I'm out of time." He states he had hospice at home but struggled with anxiety and SOB. He states he just wants to be comfortable and have his symptoms managed until he dies. He understands his time on earth is limited and states he has been ready to die. We discussed his symptoms and being at home. He would like the hospice facility.   Spoke with wife and daughter. Wife discusses anxiety and difficulty with care. She tells me she is not in good health herself. She is amenable to hospice facility placement. Family states he has not been eating or drinking well at home.    I completed a MOST form today and the signed original was placed in the chart. A photocopy was placed in the chart to be scanned into EMR. The patient outlined their wishes for the following treatment decisions:  Cardiopulmonary Resuscitation: Do Not Attempt Resuscitation (DNR/No CPR)  Medical Interventions: Comfort Measures: Keep clean, warm, and dry. Use medication by any route, positioning, wound care, and other measures to relieve pain and suffering. Use oxygen, suction and manual treatment of airway obstruction as needed for comfort. Do not transfer to the hospital unless comfort needs cannot be met in  current location.  Antibiotics: No antibiotics (use other measures to relieve symptoms)  IV Fluids: No IV fluids (provide other measures to ensure comfort)  Feeding Tube: No feeding tube    ADDENDUM: Morphine infusion with boluses initiated for comfort and to provide more steady delivery. Cardizem drip discontinued. Patient wanting to go to hospice facility and family at bedside. Patient returned to a-fib with RVR. Metoprolol 17m IV given.     Length of Stay: 1  Current Medications: Scheduled Meds:  . Chlorhexidine Gluconate Cloth  6 each Topical Daily  .  diltiazem  180 mg Oral Daily  . Ensure Max Protein  11 oz Oral BID  . rosuvastatin  10 mg Oral Daily  . tiotropium  18 mcg Inhalation Daily    Continuous Infusions: . diltiazem (CARDIZEM) infusion      PRN Meds: acetaminophen, LORazepam, morphine injection, ondansetron (ZOFRAN) IV  Physical Exam Skin:    General: Skin is warm and dry.  Neurological:     Mental Status: He is alert.             Vital Signs: BP 115/76   Pulse 73   Temp (!) 96 F (35.6 C) (Oral)   Resp (!) 39   Ht 6' (1.829 m)   Wt 127 kg   SpO2 95%   BMI 37.97 kg/m  SpO2: SpO2: 95 % O2 Device: O2 Device: Nasal Cannula O2 Flow Rate: O2 Flow Rate (L/min): 4 L/min  Intake/output summary:   Intake/Output Summary (Last 24 hours) at 11/15/2020 1113 Last data filed at 11/15/2020 1000 Gross per 24 hour  Intake 522.06 ml  Output 1075 ml  Net -552.94 ml   LBM: Last BM Date:  (PTA) Baseline Weight: Weight: 127 kg Most recent weight: Weight: 127 kg    Patient Active Problem List   Diagnosis Date Noted  . Pressure injury of skin 11/15/2020  . Acute on chronic respiratory failure with hypoxia and hypercapnia ([place]) 11/14/2020  . Respiratory acidosis 11/14/2020  . Acute metabolic encephalopathy 55/37/4827  . Chronic anticoagulation 11/14/2020  . COPD with chronic bronchitis ([place]) 11/14/2020  . Acute on chronic respiratory failure with hypoxia ([place])    . Atrial fibrillation with rapid ventricular response ([place]) 10/30/2020  . Chronic respiratory failure with hypoxia ([place]) 03/25/2019  . Chronic a-fib ([place]) 04/16/2018  . Moderate tricuspid insufficiency 12/08/2017  . Hyponatremia 04/16/2017  . CAD (coronary artery disease) 05/23/2016  . Unstable angina ([place]) 05/20/2016  . Alcohol use disorder, moderate, dependence ([place]) 09/26/2015  . History of alcoholism ([place]) 09/26/2015  . Adenocarcinoma of prostate ([place]) 05/22/2015  . 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 Elevated PSA 03/14/2015  . BPH (benign prostatic hyperplasia) 03/14/2015  . Essential (primary) hypertension 02/18/2015  . Benign essential HTN 02/17/2015  . Elevated prostate specific antigen (PSA) 02/16/2015  . Cellulitis 09/21/2014  . Breathlessness on exertion 08/29/2014  . COPD exacerbation ([place]) 04/28/2014  . Lesion of external ear 04/28/2014  . Alcohol use disorder, mild, abuse 02/28/2014  . 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 OSA (obstructive sleep apnea) 02/28/2014  . Current tobacco use 02/28/2014    Palliative Care Assessment & Plan   Recommendations/Plan:  Hospice facility and comfort care.   Code Status:    Code Status Orders  (From admission, onward)         Start     Ordered   11/14/20 2029  Do not attempt resuscitation (DNR)  Continuous       Question Answer Comment  In the event of cardiac or respiratory ARREST Do not call a "code blue"   In the event of cardiac or respiratory ARREST Do not perform Intubation, CPR, defibrillation or ACLS   In the event of cardiac or respiratory ARREST Use medication by any route, position, wound care, and other measures to relive pain and suffering. May use oxygen, suction and manual treatment of airway obstruction as needed for comfort.      11/14/20 2031        Code Status History    Date  Active Date Inactive Code Status Order ID Comments User Context    11/14/2020 0230 11/14/2020 2031 DNR [id number]  Athena Masse, MD ED   11/14/2020 0144 11/14/2020 0230 DNR [id number]  Carrie Mew, MD ED   10/30/2020 1703 11/02/2020 1909 DNR [id number]  Sidney Ace, Arvella Merles, MD ED   05/23/2016 1701 05/24/2016 1428 Full Code [id number]  Isaias Cowman, MD Inpatient   Advance Care Planning Activity       Prognosis:   < 2 weeks Diuresed and BIPAP therapy during hospitalization. End stage COPD.     Care plan was discussed with primary MD and RN  Thank you for allowing the Palliative Medicine Team to assist in the care of this patient.   Time In: 10:20 Time Out: 11:10 Total Time 50 min Prolonged Time Billed no      Greater than 50%  of this time was spent counseling and coordinating care related to the above assessment and plan.  Asencion Gowda, NP  Please contact Palliative Medicine Team phone at [phone] for questions and concerns.

## 2020-11-15 NOTE — Progress Notes (Signed)
PT Cancellation Note  Patient Details Name: Robert Powell MRN: [id number] DOB: [birth year]   Cancelled Treatment:    Reason Eval/Treat Not Completed: Medical issues which prohibited therapy: Per chart review pt with decline in status and with possible transition to comfort care.  Will complete PT orders at this time but will reassess pt pending a change in status upon receipt of new PT orders.    Linus Salmons PT, DPT 11/15/20, 8:46 AM

## 2020-11-15 NOTE — Progress Notes (Signed)
OT Cancellation Note  Patient Details Name: Robert Powell MRN: [id number] DOB: 24-Aug-1951   Cancelled Treatment:    Reason Eval/Treat Not Completed: Patient not medically ready. Per chart review pt with decline in status and with possible transition to comfort care.  Will complete OT orders at this time but will reassess pt pending a change in status upon receipt of new OT orders.  Dessie Coma, M.S. OTR/L  11/15/20, 8:46 AM  ascom [phone]
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
## 2020-11-30 ENCOUNTER — Other Ambulatory Visit: Payer: Self-pay
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
## 2020-12-03 DIAGNOSIS — J9611 Chronic respiratory failure with hypoxia: Secondary | ICD-10-CM | POA: Diagnosis not present

## 2020-12-08 DEATH — deceased
